# Patient Record
Sex: Male | Born: 1935 | Race: White | Hispanic: No | Smoking: Never smoker
Health system: Southern US, Community
[De-identification: ages and names within clinical notes are randomized; demographics above are authoritative.]

## PROBLEM LIST (undated history)

## (undated) DIAGNOSIS — H3530 Unspecified macular degeneration: Secondary | ICD-10-CM

## (undated) DIAGNOSIS — C44121 Squamous cell carcinoma of skin of unspecified eyelid, including canthus: Secondary | ICD-10-CM

## (undated) DIAGNOSIS — I499 Cardiac arrhythmia, unspecified: Secondary | ICD-10-CM

## (undated) DIAGNOSIS — D369 Benign neoplasm, unspecified site: Secondary | ICD-10-CM

## (undated) DIAGNOSIS — I509 Heart failure, unspecified: Secondary | ICD-10-CM

## (undated) DIAGNOSIS — K219 Gastro-esophageal reflux disease without esophagitis: Secondary | ICD-10-CM

## (undated) DIAGNOSIS — H409 Unspecified glaucoma: Secondary | ICD-10-CM

## (undated) DIAGNOSIS — F0390 Unspecified dementia without behavioral disturbance: Secondary | ICD-10-CM

## (undated) DIAGNOSIS — R4189 Other symptoms and signs involving cognitive functions and awareness: Secondary | ICD-10-CM

## (undated) DIAGNOSIS — I4891 Unspecified atrial fibrillation: Secondary | ICD-10-CM

## (undated) DIAGNOSIS — J189 Pneumonia, unspecified organism: Secondary | ICD-10-CM

## (undated) HISTORY — PX: MOHS SURGERY: SUR867

## (undated) HISTORY — PX: EYE SURGERY: SHX253

---

## 2011-03-12 DIAGNOSIS — D239 Other benign neoplasm of skin, unspecified: Secondary | ICD-10-CM

## 2011-03-12 HISTORY — DX: Other benign neoplasm of skin, unspecified: D23.9

## 2011-05-09 ENCOUNTER — Ambulatory Visit (INDEPENDENT_AMBULATORY_CARE_PROVIDER_SITE_OTHER): Payer: Medicare Other | Admitting: Internal Medicine

## 2011-05-09 ENCOUNTER — Encounter: Payer: Self-pay | Admitting: Internal Medicine

## 2011-05-09 DIAGNOSIS — I482 Chronic atrial fibrillation, unspecified: Secondary | ICD-10-CM | POA: Insufficient documentation

## 2011-05-09 DIAGNOSIS — Z0000 Encounter for general adult medical examination without abnormal findings: Secondary | ICD-10-CM

## 2011-05-09 DIAGNOSIS — R4189 Other symptoms and signs involving cognitive functions and awareness: Secondary | ICD-10-CM | POA: Insufficient documentation

## 2011-05-09 DIAGNOSIS — N400 Enlarged prostate without lower urinary tract symptoms: Secondary | ICD-10-CM

## 2011-05-09 DIAGNOSIS — I4891 Unspecified atrial fibrillation: Secondary | ICD-10-CM

## 2011-05-09 DIAGNOSIS — H409 Unspecified glaucoma: Secondary | ICD-10-CM

## 2011-05-09 DIAGNOSIS — F09 Unspecified mental disorder due to known physiological condition: Secondary | ICD-10-CM

## 2011-05-09 LAB — CBC WITH DIFFERENTIAL/PLATELET
Basophils Relative: 0.6 % (ref 0.0–3.0)
Eosinophils Absolute: 0.3 10*3/uL (ref 0.0–0.7)
Lymphocytes Relative: 14.9 % (ref 12.0–46.0)
MCHC: 34.8 g/dL (ref 30.0–36.0)
Neutrophils Relative %: 68.3 % (ref 43.0–77.0)
Platelets: 211 10*3/uL (ref 150.0–400.0)
RBC: 4.26 Mil/uL (ref 4.22–5.81)
WBC: 7.4 10*3/uL (ref 4.5–10.5)

## 2011-05-09 LAB — HEPATIC FUNCTION PANEL
ALT: 25 U/L (ref 0–53)
AST: 30 U/L (ref 0–37)
Bilirubin, Direct: 0.2 mg/dL (ref 0.0–0.3)
Total Bilirubin: 1.5 mg/dL — ABNORMAL HIGH (ref 0.3–1.2)

## 2011-05-09 LAB — TSH: TSH: 2.46 u[IU]/mL (ref 0.35–5.50)

## 2011-05-09 LAB — POCT INR: INR: 2.6

## 2011-05-09 LAB — BASIC METABOLIC PANEL
BUN: 16 mg/dL (ref 6–23)
Chloride: 105 mEq/L (ref 96–112)
Potassium: 4.6 mEq/L (ref 3.5–5.1)

## 2011-05-09 MED ORDER — MEMANTINE HCL 5 MG PO TABS: 5.0000 mg | ORAL_TABLET | Freq: Every day | ORAL | Status: DC

## 2011-05-09 MED ORDER — DIGOXIN 250 MCG PO TABS: ORAL_TABLET | ORAL | Status: DC

## 2011-05-09 NOTE — Progress Notes (Signed)
Subjective:    Patient ID: Howard Payne, male    DOB: 17-Sep-1920, 77 y.o.   MRN: [id number]  HPI  77 year old patient who is seen today to establish with our practice. He was hospitalized in 2003 for what sounds like new onset atrial fibrillation. He has been on chronic Coumadin anticoagulation since that time he has also been on rate control medications he has a history of cognitive impairment and has been on daily Namenda. He has seen ophthalmology recently.  1. Risk factors, based on past  M,S,F history  Risk  factors include age. He has a history of chronic atrial fibrillation  2.  Physical activities: Sedentary due age  and gait instability  3.  Depression/mood: No history of depression or mood disorder  4.  Hearing: No significant impairment 5.  ADL's: Requires assistance in all aspects of daily living  6.  Fall risk: High  7.  Home safety: Presently living at an assisted living facility 8.  Height weight, and visual acuity; stable recent eye exam  9.  Counseling: Restricted salt diet encouraged  10. Lab orders based on risk factors: Laboratory profile including prothrombin time will be reviewed  11. Referral : Followup ophthalmology  12. Care plan: Salt restricted diet. Will require assistance in all aspects of daily living  13. Cognitive assessment: Moderately impaired. We'll continue Namenda       Review of Systems  Constitutional: Negative for fever, chills, activity change, appetite change and fatigue.  HENT: Negative for hearing loss, ear pain, congestion, rhinorrhea, sneezing, mouth sores, trouble swallowing, neck pain, neck stiffness, dental problem, voice change, sinus pressure and tinnitus.   Eyes: Negative for photophobia, pain, redness and visual disturbance.  Respiratory: Negative for apnea, cough, choking, chest tightness, shortness of breath and wheezing.   Cardiovascular: Negative for chest pain, palpitations and leg swelling.  Gastrointestinal: Negative  for nausea, vomiting, abdominal pain, diarrhea, constipation, blood in stool, abdominal distention, anal bleeding and rectal pain.  Genitourinary: Positive for urgency and frequency. Negative for dysuria, hematuria, flank pain, decreased urine volume, discharge, penile swelling, scrotal swelling, difficulty urinating, genital sores and testicular pain.  Musculoskeletal: Negative for myalgias, back pain, joint swelling, arthralgias and gait problem.  Skin: Negative for color change, rash and wound.  Neurological: Negative for dizziness, tremors, seizures, syncope, facial asymmetry, speech difficulty, weakness, light-headedness, numbness and headaches.  Hematological: Negative for adenopathy. Does not bruise/bleed easily.  Psychiatric/Behavioral: Negative for suicidal ideas, hallucinations, behavioral problems, confusion, sleep disturbance, self-injury, dysphoric mood, decreased concentration and agitation. The patient is not nervous/anxious.        Objective:   Physical Exam  Constitutional: He appears well-developed and well-nourished.       Blood pressure 84/60  HENT:  Head: Normocephalic and atraumatic.  Right Ear: External ear normal.  Left Ear: External ear normal.  Nose: Nose normal.  Mouth/Throat: Oropharynx is clear and moist.  Eyes: Conjunctivae and EOM are normal. Pupils are equal, round, and reactive to light. No scleral icterus.  Neck: Normal range of motion. Neck supple. No JVD present. No thyromegaly present.  Cardiovascular: Normal rate, regular rhythm, normal heart sounds and intact distal pulses.  Exam reveals no gallop and no friction rub.   No murmur heard.      Rhythm is regular  Pedal pulses full except for an absent left dorsalis pedis pulse  Pulmonary/Chest: Effort normal. He has rales. He exhibits no tenderness.       Rare basilar rales noted  Abdominal: Soft. Bowel sounds  are normal. He exhibits no distension and no mass. There is no tenderness.  Genitourinary:  Penis normal. Guaiac negative stool.       Prostate is +2 enlarged  Bilateral hernias right greater than the left  Musculoskeletal: Normal range of motion. He exhibits edema. He exhibits no tenderness.       +2 pedal edema with stasis dermatitis  Lymphadenopathy:    He has no cervical adenopathy.  Neurological: He is alert. He has normal reflexes. No cranial nerve deficit. Coordination abnormal.       Unsteady gait  Skin: Skin is warm and dry. No rash noted.       Scattered facial excoriations  Psychiatric: He has a normal mood and affect. His behavior is normal.          Assessment & Plan:

## 2011-05-09 NOTE — Progress Notes (Signed)
Addended by: Bonnye Fava on: 05/09/2011 10:36 AM   Modules accepted: Orders

## 2011-05-09 NOTE — Patient Instructions (Addendum)
Decrease digoxin to one half tablet daily Decrease metoprolol to one half tablet every other day for 2 weeks and then discontinue  Limit your sodium (Salt) intake  Return office visit 4 weeks COUMADIN: 2.5 mg on wednesdays,fridays and sundays 3.75 mg on other days,check in 4 weeks

## 2011-05-29 ENCOUNTER — Other Ambulatory Visit: Payer: Self-pay | Admitting: Internal Medicine

## 2011-05-29 MED ORDER — WARFARIN SODIUM 2.5 MG PO TABS: ORAL_TABLET | ORAL | Status: DC

## 2011-05-29 NOTE — Telephone Encounter (Signed)
Pt req refill of warfarin (COUMADIN) 2.5 MG tablet. Pt is sch for ov on 06/07/11. Pls call in to [facility] [address] [phone]

## 2011-06-07 ENCOUNTER — Ambulatory Visit (INDEPENDENT_AMBULATORY_CARE_PROVIDER_SITE_OTHER): Payer: Medicare Other | Admitting: Internal Medicine

## 2011-06-07 ENCOUNTER — Encounter: Payer: Self-pay | Admitting: Internal Medicine

## 2011-06-07 DIAGNOSIS — F09 Unspecified mental disorder due to known physiological condition: Secondary | ICD-10-CM

## 2011-06-07 DIAGNOSIS — I482 Chronic atrial fibrillation, unspecified: Secondary | ICD-10-CM

## 2011-06-07 DIAGNOSIS — I4891 Unspecified atrial fibrillation: Secondary | ICD-10-CM

## 2011-06-07 DIAGNOSIS — N400 Enlarged prostate without lower urinary tract symptoms: Secondary | ICD-10-CM

## 2011-06-07 DIAGNOSIS — R4189 Other symptoms and signs involving cognitive functions and awareness: Secondary | ICD-10-CM

## 2011-06-07 LAB — POCT INR: INR: 2.2

## 2011-06-07 MED ORDER — TAMSULOSIN HCL 0.4 MG PO CAPS: 0.4000 mg | ORAL_CAPSULE | ORAL | Status: DC

## 2011-06-07 NOTE — Patient Instructions (Addendum)
Limit your sodium (Salt) intake  Return in 4 months for follow-up  Monthly prothrombin times  Same dose of coumadin

## 2011-06-07 NOTE — Progress Notes (Signed)
  Subjective:    Patient ID: Howard Payne, male    DOB: [birth year], 77 y.o.   MRN: [id number]  HPI  77 year old patient who has a history of mild cognitive impairment treated with Namenda. He was seen here for his initial exam recently and noted to be hypotensive. Metoprolol was tapered and discontinued and he remains on digoxin for rate control. He remains on chronic Coumadin anticoagulation. He is a well except for urinary frequency.    Review of Systems  Genitourinary: Positive for frequency.       Objective:   Physical Exam  Constitutional: He is oriented to person, place, and time. He appears well-developed.       Blood pressure 104/70  HENT:  Head: Normocephalic.  Right Ear: External ear normal.  Left Ear: External ear normal.  Eyes: Conjunctivae and EOM are normal.  Neck: Normal range of motion.  Cardiovascular: Normal rate and normal heart sounds.        Controlled ventricular response  Pulmonary/Chest: Breath sounds normal.  Abdominal: Bowel sounds are normal.  Musculoskeletal: Normal range of motion. He exhibits no edema and no tenderness.  Neurological: He is alert and oriented to person, place, and time.  Psychiatric: He has a normal mood and affect. His behavior is normal.          Assessment & Plan:   Atrial fibrillation Hypotension resolved  BPH. He has occasional frequent nocturia Will place on Flomax and observe  We'll check a pro time

## 2011-06-07 NOTE — Progress Notes (Signed)
Addended by: Rita Ohara R on: 06/07/2011 01:51 PM   Modules accepted: Orders

## 2011-07-08 ENCOUNTER — Ambulatory Visit: Payer: Medicare Other

## 2011-07-08 NOTE — Patient Instructions (Addendum)
2.5 mg on wednesdays,fridays and sundays 3.75 mg on other days,check in 4 weeks

## 2011-07-09 ENCOUNTER — Other Ambulatory Visit: Payer: Self-pay | Admitting: Internal Medicine

## 2011-08-08 ENCOUNTER — Other Ambulatory Visit: Payer: Medicare Other

## 2011-08-08 ENCOUNTER — Ambulatory Visit (INDEPENDENT_AMBULATORY_CARE_PROVIDER_SITE_OTHER): Payer: Medicare Other | Admitting: Internal Medicine

## 2011-08-08 ENCOUNTER — Encounter: Payer: Self-pay | Admitting: Internal Medicine

## 2011-08-08 DIAGNOSIS — Z23 Encounter for immunization: Secondary | ICD-10-CM

## 2011-08-08 DIAGNOSIS — I482 Chronic atrial fibrillation, unspecified: Secondary | ICD-10-CM

## 2011-08-08 DIAGNOSIS — L309 Dermatitis, unspecified: Secondary | ICD-10-CM

## 2011-08-08 DIAGNOSIS — L259 Unspecified contact dermatitis, unspecified cause: Secondary | ICD-10-CM

## 2011-08-08 DIAGNOSIS — I4891 Unspecified atrial fibrillation: Secondary | ICD-10-CM

## 2011-08-08 DIAGNOSIS — Z0000 Encounter for general adult medical examination without abnormal findings: Secondary | ICD-10-CM

## 2011-08-08 MED ORDER — METHYLPREDNISOLONE ACETATE 40 MG/ML IJ SUSP
40.0000 mg | Freq: Once | INTRAMUSCULAR | Status: AC
  Administered 2011-08-08: 40 mg via INTRAMUSCULAR

## 2011-08-08 MED ORDER — TRIAMCINOLONE ACETONIDE 0.025 % EX CREA: TOPICAL_CREAM | Freq: Two times a day (BID) | CUTANEOUS | Status: DC

## 2011-08-08 NOTE — Progress Notes (Signed)
  Subjective:    Patient ID: Howard Payne, male    DOB: [birth year], 77 y.o.   MRN: [id number]  HPI  77 year old patient who has a history of chronic atrial fibrillation. He has cognitive impairment. For the past month he has been complaining of some itching involving his posterior back area. His daughter recently noticed this and he is brought in today for followup. The patient has been using alcohol with a brush to try to obtain relief from the itching.    Review of Systems  Skin: Positive for rash.       Objective:   Physical Exam  Constitutional: He appears well-developed and well-nourished. No distress.       Blood pressure well controlled  Cardiovascular:       Controlled ventricular response  Skin:       Patient had a maculopapular rash involving the back area multiple seborrheic dermatoses noted          Assessment & Plan:   Maculopapular rash of the back. Nonspecific we'll treat with 40 mg of Depo-Medrol as well as with topical triamcinolone. Local skin care discussed. He will avoid trauma and alcohol use

## 2011-08-08 NOTE — Patient Instructions (Addendum)
COUMADIN: Same dose, 2.5 mg on wednesdays,fridays and sundays 3.75 mg on other days,check in 4  weeks  Avoid topical alcohol

## 2011-08-08 NOTE — Progress Notes (Signed)
Addended by: Duard Brady I on: 08/08/2011 11:34 AM   Modules accepted: Orders

## 2011-10-08 ENCOUNTER — Ambulatory Visit (INDEPENDENT_AMBULATORY_CARE_PROVIDER_SITE_OTHER): Payer: Medicare Other | Admitting: Internal Medicine

## 2011-10-08 ENCOUNTER — Encounter: Payer: Self-pay | Admitting: Internal Medicine

## 2011-10-08 DIAGNOSIS — F09 Unspecified mental disorder due to known physiological condition: Secondary | ICD-10-CM

## 2011-10-08 DIAGNOSIS — I482 Chronic atrial fibrillation, unspecified: Secondary | ICD-10-CM

## 2011-10-08 DIAGNOSIS — I4891 Unspecified atrial fibrillation: Secondary | ICD-10-CM

## 2011-10-08 DIAGNOSIS — R4189 Other symptoms and signs involving cognitive functions and awareness: Secondary | ICD-10-CM

## 2011-10-08 DIAGNOSIS — N400 Enlarged prostate without lower urinary tract symptoms: Secondary | ICD-10-CM

## 2011-10-08 NOTE — Patient Instructions (Addendum)
Monthly INR  Limit your sodium (Salt) intake  Return in 6 months for follow-up    Latest dosing instructions   Total Sun Mon Tue Wed Thu Fri Sat   22.5 2.5 mg 3.75 mg 3.75 mg 2.5 mg 3.75 mg 2.5 mg 3.75 mg    (2.5 mg1) (2.5 mg1.5) (2.5 mg1.5) (2.5 mg1) (2.5 mg1.5) (2.5 mg1) (2.5 mg1.5)

## 2011-10-08 NOTE — Progress Notes (Signed)
  Subjective:    Patient ID: Howard Payne, male    DOB: 26-Dec-1919, 77 y.o.   MRN: [id number]  HPI  77 year old patient who is seen today for followup. He has a history of chronic atrial fibrillation and is on rate control and Coumadin anticoagulation. He has moderate cognitive impairment and remains on low dose Namenda. He is doing quite well today. He has glaucoma and also macular degeneration. No cardiopulmonary complaints. Apparently no INR for 2 months    Review of Systems  Constitutional: Negative for fever, chills, appetite change and fatigue.  HENT: Negative for hearing loss, ear pain, congestion, sore throat, trouble swallowing, neck stiffness, dental problem, voice change and tinnitus.   Eyes: Positive for visual disturbance. Negative for pain and discharge.  Respiratory: Negative for cough, chest tightness, wheezing and stridor.   Cardiovascular: Negative for chest pain, palpitations and leg swelling.  Gastrointestinal: Negative for nausea, vomiting, abdominal pain, diarrhea, constipation, blood in stool and abdominal distention.  Genitourinary: Negative for urgency, hematuria, flank pain, discharge, difficulty urinating and genital sores.  Musculoskeletal: Negative for myalgias, back pain, joint swelling, arthralgias and gait problem.  Skin: Negative for rash.  Neurological: Negative for dizziness, syncope, speech difficulty, weakness, numbness and headaches.  Hematological: Negative for adenopathy. Does not bruise/bleed easily.  Psychiatric/Behavioral: Positive for confusion. Negative for behavioral problems and dysphoric mood. The patient is not nervous/anxious.        Objective:   Physical Exam  Constitutional: He is oriented to person, place, and time. He appears well-developed.  HENT:  Head: Normocephalic.  Right Ear: External ear normal.  Left Ear: External ear normal.  Eyes: Conjunctivae and EOM are normal.  Neck: Normal range of motion.  Cardiovascular: Normal rate  and normal heart sounds.        Controlled ventricular response  Pulmonary/Chest: Breath sounds normal.  Abdominal: Bowel sounds are normal.  Musculoskeletal: Normal range of motion. He exhibits no edema and no tenderness.       Trace edema  Neurological: He is alert and oriented to person, place, and time.  Psychiatric: He has a normal mood and affect. His behavior is normal.          Assessment & Plan:   Chronic atrial fibrillation  Coumadin anticoagulation. We'll check an INR Dementia. We'll continue Namenda  Monthly INRs will be monitored We'll see back in 6 months

## 2011-11-08 ENCOUNTER — Ambulatory Visit: Payer: Medicare Other

## 2011-11-08 DIAGNOSIS — I4891 Unspecified atrial fibrillation: Secondary | ICD-10-CM

## 2011-11-08 DIAGNOSIS — I482 Chronic atrial fibrillation, unspecified: Secondary | ICD-10-CM

## 2011-11-08 LAB — POCT INR: INR: 1.8

## 2011-11-08 NOTE — Patient Instructions (Signed)
  Latest dosing instructions   Total Sun Mon Tue Wed Thu Fri Sat   23.75 3.75 mg 2.5 mg 3.75 mg 3.75 mg 2.5 mg 3.75 mg 3.75 mg    (2.5 mg1.5) (2.5 mg1) (2.5 mg1.5) (2.5 mg1.5) (2.5 mg1) (2.5 mg1.5) (2.5 mg1.5)

## 2011-12-09 ENCOUNTER — Ambulatory Visit (INDEPENDENT_AMBULATORY_CARE_PROVIDER_SITE_OTHER): Payer: Medicare Other | Admitting: Internal Medicine

## 2011-12-09 DIAGNOSIS — I4891 Unspecified atrial fibrillation: Secondary | ICD-10-CM

## 2011-12-09 DIAGNOSIS — I482 Chronic atrial fibrillation, unspecified: Secondary | ICD-10-CM

## 2011-12-09 NOTE — Patient Instructions (Signed)
  Latest dosing instructions   Total Sun Mon Tue Wed Thu Fri Sat   26.25 3.75 mg 3.75 mg 3.75 mg 3.75 mg 3.75 mg 3.75 mg 3.75 mg    (2.5 mg1.5) (2.5 mg1.5) (2.5 mg1.5) (2.5 mg1.5) (2.5 mg1.5) (2.5 mg1.5) (2.5 mg1.5)        

## 2011-12-30 ENCOUNTER — Ambulatory Visit (INDEPENDENT_AMBULATORY_CARE_PROVIDER_SITE_OTHER): Payer: Medicare Other | Admitting: Internal Medicine

## 2011-12-30 DIAGNOSIS — I482 Chronic atrial fibrillation, unspecified: Secondary | ICD-10-CM

## 2011-12-30 DIAGNOSIS — I4891 Unspecified atrial fibrillation: Secondary | ICD-10-CM

## 2011-12-30 NOTE — Patient Instructions (Signed)
  Latest dosing instructions   Total Sun Mon Tue Wed Thu Fri Sat   26.25 3.75 mg 3.75 mg 3.75 mg 3.75 mg 3.75 mg 3.75 mg 3.75 mg    (2.5 mg1.5) (2.5 mg1.5) (2.5 mg1.5) (2.5 mg1.5) (2.5 mg1.5) (2.5 mg1.5) (2.5 mg1.5)

## 2012-01-27 ENCOUNTER — Ambulatory Visit (INDEPENDENT_AMBULATORY_CARE_PROVIDER_SITE_OTHER): Payer: Medicare Other

## 2012-01-27 DIAGNOSIS — Z7901 Long term (current) use of anticoagulants: Secondary | ICD-10-CM

## 2012-01-27 DIAGNOSIS — I482 Chronic atrial fibrillation, unspecified: Secondary | ICD-10-CM

## 2012-01-27 LAB — POCT INR: INR: 2

## 2012-01-27 NOTE — Patient Instructions (Signed)
  Latest dosing instructions   Total Sun Mon Tue Wed Thu Fri Sat   26.25 3.75 mg 3.75 mg 3.75 mg 3.75 mg 3.75 mg 3.75 mg 3.75 mg    (2.5 mg1.5) (2.5 mg1.5) (2.5 mg1.5) (2.5 mg1.5) (2.5 mg1.5) (2.5 mg1.5) (2.5 mg1.5)        

## 2012-02-03 ENCOUNTER — Ambulatory Visit (INDEPENDENT_AMBULATORY_CARE_PROVIDER_SITE_OTHER): Payer: Medicare Other | Admitting: Internal Medicine

## 2012-02-03 ENCOUNTER — Encounter: Payer: Self-pay | Admitting: Internal Medicine

## 2012-02-03 VITALS — BP 100/60 | HR 60 | Temp 98.6°F | Wt 196.0 lb

## 2012-02-03 DIAGNOSIS — R0981 Nasal congestion: Secondary | ICD-10-CM

## 2012-02-03 DIAGNOSIS — R4189 Other symptoms and signs involving cognitive functions and awareness: Secondary | ICD-10-CM

## 2012-02-03 DIAGNOSIS — I4891 Unspecified atrial fibrillation: Secondary | ICD-10-CM

## 2012-02-03 DIAGNOSIS — F09 Unspecified mental disorder due to known physiological condition: Secondary | ICD-10-CM

## 2012-02-03 DIAGNOSIS — I482 Chronic atrial fibrillation, unspecified: Secondary | ICD-10-CM

## 2012-02-03 DIAGNOSIS — J3489 Other specified disorders of nose and nasal sinuses: Secondary | ICD-10-CM

## 2012-02-03 MED ORDER — FLUTICASONE PROPIONATE 50 MCG/ACT NA SUSP: 2.0000 | Freq: Every day | NASAL | Status: DC

## 2012-02-03 NOTE — Progress Notes (Signed)
  Subjective:    Patient ID: Howard Payne, male    DOB: 1920-05-24, 78 y.o.   MRN: [id number]  HPI  78 year old patient who has a history of chronic atrial fibrillation who has been on chronic Coumadin anticoagulation. Last night he complained of shortness of breath at his extended care facility and EMS was notified. His initial blood pressure was 180/100 but later decreased to 146/92. Clinically checked out well with normal oxygen saturations and referral to ED was considered but due to his stable clinical status canceled. Today he complains of nasal stuffiness. He does have a history of chronic nasal polyposis and apparently has had polypectomies on 3 prior occasions but none recently. His only complaint today is nasal stuffiness and cough. There's been no fever or sputum production. Denies any shortness of breath at this time. No history of congestive heart failure. He does have a history of glaucoma as well as BPH  Wt Readings from Last 3 Encounters:  02/03/12 196 lb (88.905 kg)  10/08/11 199 lb (90.266 kg)  08/08/11 195 lb (88.451 kg)    Review of Systems  Constitutional: Negative for fever, chills, appetite change and fatigue.  HENT: Positive for congestion, rhinorrhea and sinus pressure. Negative for hearing loss, ear pain, sore throat, trouble swallowing, neck stiffness, dental problem, voice change and tinnitus.   Eyes: Negative for pain, discharge and visual disturbance.  Respiratory: Positive for cough and shortness of breath. Negative for chest tightness, wheezing and stridor.   Cardiovascular: Negative for chest pain, palpitations and leg swelling.  Gastrointestinal: Negative for nausea, vomiting, abdominal pain, diarrhea, constipation, blood in stool and abdominal distention.  Genitourinary: Negative for urgency, hematuria, flank pain, discharge, difficulty urinating and genital sores.  Musculoskeletal: Negative for myalgias, back pain, joint swelling, arthralgias and gait problem.    Skin: Negative for rash.  Neurological: Negative for dizziness, syncope, speech difficulty, weakness, numbness and headaches.  Hematological: Negative for adenopathy. Does not bruise/bleed easily.  Psychiatric/Behavioral: Negative for behavioral problems and dysphoric mood. The patient is not nervous/anxious.        Objective:   Physical Exam  Constitutional: He is oriented to person, place, and time. He appears well-developed.  HENT:  Head: Normocephalic.  Right Ear: External ear normal.  Left Ear: External ear normal.       Nasal mucosa congested and erythematous bilaterally  Eyes: Conjunctivae and EOM are normal.  Neck: Normal range of motion.  Cardiovascular: Normal rate and normal heart sounds.        Irregular rhythm with a controlled ventricular response  Pulmonary/Chest: Breath sounds normal.  Abdominal: Bowel sounds are normal.  Genitourinary:       Trace lower extremity edema  Musculoskeletal: Normal range of motion. He exhibits edema. He exhibits no tenderness.  Neurological: He is alert and oriented to person, place, and time.  Psychiatric: He has a normal mood and affect. His behavior is normal.          Assessment & Plan:

## 2012-02-03 NOTE — Patient Instructions (Signed)
Limit your sodium (Salt) intake  Return in 3 months for follow-up   

## 2012-02-05 ENCOUNTER — Telehealth: Payer: Self-pay | Admitting: *Deleted

## 2012-02-05 NOTE — Telephone Encounter (Signed)
Daughter called stating pt was coughing a little less and sleeping better.  Advised to push liquids, watch for any elevated temp, SOB, and call with any questions.  Also, watch to make sure cough settles down eventually instead of getting worse.

## 2012-02-06 ENCOUNTER — Encounter: Payer: Self-pay | Admitting: Internal Medicine

## 2012-02-06 ENCOUNTER — Ambulatory Visit (INDEPENDENT_AMBULATORY_CARE_PROVIDER_SITE_OTHER): Payer: Medicare Other | Admitting: Internal Medicine

## 2012-02-06 DIAGNOSIS — I482 Chronic atrial fibrillation, unspecified: Secondary | ICD-10-CM

## 2012-02-06 DIAGNOSIS — R58 Hemorrhage, not elsewhere classified: Secondary | ICD-10-CM

## 2012-02-06 DIAGNOSIS — I998 Other disorder of circulatory system: Secondary | ICD-10-CM

## 2012-02-06 DIAGNOSIS — R4189 Other symptoms and signs involving cognitive functions and awareness: Secondary | ICD-10-CM

## 2012-02-06 DIAGNOSIS — I4891 Unspecified atrial fibrillation: Secondary | ICD-10-CM

## 2012-02-06 DIAGNOSIS — F09 Unspecified mental disorder due to known physiological condition: Secondary | ICD-10-CM

## 2012-02-06 DIAGNOSIS — Z7901 Long term (current) use of anticoagulants: Secondary | ICD-10-CM

## 2012-02-06 NOTE — Telephone Encounter (Signed)
Dr. K notified. 

## 2012-02-06 NOTE — Progress Notes (Signed)
  Subjective:    Patient ID: Howard Payne, male    DOB: 13-Apr-1920, 78 y.o.   MRN: [id number]  HPI  Wt Readings from Last 3 Encounters:  02/03/12 196 lb (88.905 kg)  10/08/11 199 lb (90.266 kg)  08/08/11 195 lb (88.451 kg)    Review of Systems     Objective:   Physical Exam        Assessment & Plan:

## 2012-02-06 NOTE — Progress Notes (Signed)
  Subjective:    Patient ID: Howard Payne, male    DOB: 31-Aug-1920, 78 y.o.   MRN: [id number]  HPI  an 78 year old patient who has a history of chronic atrial fibrillation who has been on chronic Coumadin anticoagulation. He was seen here earlier in the week had an episode of the shortness of breath through the night he self and developed 2 days of very vigorous coughing. He was noted at his extended care facility to have extensive bruising involving the right flank area. An INR was 2.09 days ago. His cough has large resolved and in general he feels well. He does have significant cognitive impairment. There is no history of fall or trauma    Review of Systems  Respiratory: Positive for cough.   Skin: Positive for wound.       Objective:   Physical Exam  Constitutional:       No acute distress O2 saturation 97% pulse rate 72 Significant cognitive impairment    Cardiovascular: Normal rate.   Pulmonary/Chest: Effort normal and breath sounds normal.       A few crackles at both bases  Skin:       Extensive ecchymoses was present involving the right flank area this extended to the buttock area on the right right lateral upper leg and into the right groin region          Assessment & Plan:   Ecchymoses right flank. The patient does have mild tenderness over the right lateral lower chest wall. We'll check an INR to rule out Coumadin coagulopathy Chronic atrial fibrillation Cognitive impairment

## 2012-02-06 NOTE — Patient Instructions (Addendum)
Limit your sodium (Salt) intake  Call or return to clinic prn if these symptoms worsen or fail to improve as anticipated.     Latest dosing instructions   Total Sun Mon Tue Wed Thu Fri Sat   26.25 3.75 mg 3.75 mg 3.75 mg 3.75 mg 3.75 mg 3.75 mg 3.75 mg    (2.5 mg1.5) (2.5 mg1.5) (2.5 mg1.5) (2.5 mg1.5) (2.5 mg1.5) (2.5 mg1.5) (2.5 mg1.5)

## 2012-02-24 ENCOUNTER — Ambulatory Visit: Payer: Medicare Other

## 2012-03-02 ENCOUNTER — Ambulatory Visit: Payer: Medicare Other

## 2012-03-05 ENCOUNTER — Ambulatory Visit (INDEPENDENT_AMBULATORY_CARE_PROVIDER_SITE_OTHER): Payer: Medicare Other | Admitting: Internal Medicine

## 2012-03-05 DIAGNOSIS — I482 Chronic atrial fibrillation, unspecified: Secondary | ICD-10-CM

## 2012-03-05 DIAGNOSIS — I4891 Unspecified atrial fibrillation: Secondary | ICD-10-CM

## 2012-03-05 NOTE — Patient Instructions (Signed)
  Latest dosing instructions   Total Sun Mon Tue Wed Thu Fri Sat   26.25 3.75 mg 3.75 mg 3.75 mg 3.75 mg 3.75 mg 3.75 mg 3.75 mg    (2.5 mg1.5) (2.5 mg1.5) (2.5 mg1.5) (2.5 mg1.5) (2.5 mg1.5) (2.5 mg1.5) (2.5 mg1.5)

## 2012-03-19 ENCOUNTER — Ambulatory Visit: Payer: Medicare Other

## 2012-03-20 ENCOUNTER — Ambulatory Visit (INDEPENDENT_AMBULATORY_CARE_PROVIDER_SITE_OTHER): Payer: Medicare Other

## 2012-03-20 DIAGNOSIS — I482 Chronic atrial fibrillation, unspecified: Secondary | ICD-10-CM

## 2012-03-20 DIAGNOSIS — I4891 Unspecified atrial fibrillation: Secondary | ICD-10-CM

## 2012-03-20 NOTE — Patient Instructions (Signed)
  Latest dosing instructions   Total Sun Mon Tue Wed Thu Fri Sat   26.25 3.75 mg 3.75 mg 3.75 mg 3.75 mg 3.75 mg 3.75 mg 3.75 mg    (2.5 mg1.5) (2.5 mg1.5) (2.5 mg1.5) (2.5 mg1.5) (2.5 mg1.5) (2.5 mg1.5) (2.5 mg1.5)        

## 2012-04-06 ENCOUNTER — Telehealth: Payer: Self-pay

## 2012-04-06 ENCOUNTER — Emergency Department (HOSPITAL_COMMUNITY)
Admission: EM | Admit: 2012-04-06 | Discharge: 2012-04-06 | Disposition: A | Discharge status: Home / Self Care | Payer: Medicare Other | Attending: Emergency Medicine | Admitting: Emergency Medicine

## 2012-04-06 ENCOUNTER — Encounter (HOSPITAL_COMMUNITY): Payer: Self-pay | Admitting: Family Medicine

## 2012-04-06 DIAGNOSIS — Z79899 Other long term (current) drug therapy: Secondary | ICD-10-CM | POA: Insufficient documentation

## 2012-04-06 DIAGNOSIS — R040 Epistaxis: Secondary | ICD-10-CM

## 2012-04-06 HISTORY — DX: Cardiac arrhythmia, unspecified: I49.9

## 2012-04-06 HISTORY — DX: Other symptoms and signs involving cognitive functions and awareness: R41.89

## 2012-04-06 HISTORY — DX: Unspecified macular degeneration: H35.30

## 2012-04-06 LAB — POCT I-STAT, CHEM 8
Calcium, Ion: 1.19 mmol/L (ref 1.12–1.32)
Chloride: 107 mEq/L (ref 96–112)
HCT: 39 % (ref 39.0–52.0)
Potassium: 3.8 mEq/L (ref 3.5–5.1)
Sodium: 140 mEq/L (ref 135–145)

## 2012-04-06 LAB — PROTIME-INR
INR: 2.06 — ABNORMAL HIGH (ref 0.00–1.49)
Prothrombin Time: 23.6 seconds — ABNORMAL HIGH (ref 11.6–15.2)

## 2012-04-06 MED ORDER — SERTRALINE HCL 25 MG PO TABS: 25.0000 mg | ORAL_TABLET | Freq: Every day | ORAL | Status: DC

## 2012-04-06 MED ORDER — COCAINE HCL 4 % EX SOLN
4.0000 mL | Freq: Once | CUTANEOUS | Status: AC
  Administered 2012-04-06: 4 mL via NASAL
  Filled 2012-04-06: qty 4

## 2012-04-06 MED ORDER — SILVER NITRATE-POT NITRATE 75-25 % EX MISC
CUTANEOUS | Status: AC
  Administered 2012-04-06: 22:00:00
  Filled 2012-04-06: qty 1

## 2012-04-06 NOTE — ED Provider Notes (Signed)
History     CSN: [id number]  Arrival date & time 04/06/12  1827   First MD Initiated Contact with Patient 04/06/12 1927      Chief Complaint  Patient presents with  . Epistaxis    HPI Pt had a nosebleed earlier this evening.  EMS was called and he was noted to be hypertensive.  THe nosebleed has subsequently resolved.  Pt did have his coumadin adjusted recently.  No injuries.  No fever.  No other complaints.  The bleeding was coming from the right nostril.   Past Medical History  Diagnosis Date  . Irregular heart rate   . Macular degeneration   . Cognitive impairment   . Hypotension     History reviewed. No pertinent past surgical history.  History reviewed. No pertinent family history.  History  Substance Use Topics  . Smoking status: Never Smoker   . Smokeless tobacco: Never Used  . Alcohol Use: No      Review of Systems  All other systems reviewed and are negative.    Allergies  Review of patient's allergies indicates no known allergies.  Home Medications   Current Outpatient Rx  Name Route Sig Dispense Refill  . DIGOXIN 0.25 MG PO TABS  One half tablet daily 90 tablet 4  . MEMANTINE HCL 5 MG PO TABS Oral Take 1 tablet (5 mg total) by mouth daily. 90 tablet 4  . TRAVOPROST 0.004 % OP SOLN Both Eyes Place 1 drop into both eyes.      . WARFARIN SODIUM 2.5 MG PO TABS  TAKE 1 TABLET BY MOUTH SUN,WED,FRI AND TAKE 1 AND 1/2 TABLET ON MON, TUES, THURS, SAT 90 tablet 5  . FLUTICASONE PROPIONATE 50 MCG/ACT NA SUSP Nasal Place 2 sprays into the nose daily. 16 g 6  . SERTRALINE HCL 25 MG PO TABS Oral Take 1 tablet (25 mg total) by mouth daily. 60 tablet 0  . TRIAMCINOLONE ACETONIDE 0.025 % EX CREA Topical Apply topically 2 (two) times daily. 60 g 2    BP 119/70  Pulse 80  Temp(Src) 99.4 F (37.4 C) (Oral)  Resp 17  SpO2 97%  Physical Exam  Nursing note and vitals reviewed. Constitutional: He appears well-developed and well-nourished. No distress.  HENT:    Head: Normocephalic and atraumatic.  Right Ear: External ear normal.  Left Ear: External ear normal.       Dried blood right nares, no active bleeding  Eyes: Conjunctivae are normal. Right eye exhibits no discharge. Left eye exhibits no discharge. No scleral icterus.  Neck: Neck supple. No tracheal deviation present.  Cardiovascular: Normal rate, regular rhythm and intact distal pulses.   Pulmonary/Chest: Effort normal and breath sounds normal. No stridor. No respiratory distress. He has no wheezes. He has no rales.  Abdominal: Soft. Bowel sounds are normal. He exhibits no distension. There is no tenderness. There is no rebound and no guarding.  Musculoskeletal: He exhibits no edema and no tenderness.  Neurological: He is alert. He has normal strength. No sensory deficit. Cranial nerve deficit:  no gross defecits noted. He exhibits normal muscle tone. He displays no seizure activity. Coordination normal.  Skin: Skin is warm and dry. No rash noted.  Psychiatric: He has a normal mood and affect.    ED Course  EPISTAXIS MANAGEMENT Performed by: Linwood Dibbles R Authorized by: Linwood Dibbles R Consent: Verbal consent obtained. Consent given by: patient Local anesthetic: lidocaine spray Patient sedated: no Treatment site: right anterior Repair method: silver  nitrate Treatment complexity: simple Recurrence: recurrence of recent bleed Patient tolerance: Patient tolerated the procedure well with no immediate complications.   (including critical care time)  Labs Reviewed  PROTIME-INR - Abnormal; Notable for the following:    Prothrombin Time 23.6 (*)    INR 2.06 (*)    All other components within normal limits  POCT I-STAT, CHEM 8 - Abnormal; Notable for the following:    Glucose, Bld 112 (*)    All other components within normal limits   No results found.   1. Epistaxis       MDM  Pt monitored in the ED for several hours.  No further nasal bleeding.  Stable for discharge.  No  anemia and inr therapeutic        Celene Kras, MD 04/06/12 [phone]

## 2012-04-06 NOTE — Telephone Encounter (Signed)
Triage VM:  Cherlyn Roberts called and would like to know if Dr. Levie Heritage would consider giving pt an antidepressant such as Zoloft.  Per Ms. Wyline Mood pt is still dressing himself and participating in activities but pt has been saying "things are not like they use to be".  If an ov is needed pt can come in.  Pls advise.

## 2012-04-06 NOTE — ED Notes (Signed)
Per EMS: Pt from [facility]. Reports nosebleed from right nostril starting at approximately 17:45 today. States pt lost approx 20 cc of blood. Bleeding controlled at this time. VSS. NAD noted at this time.

## 2012-04-06 NOTE — ED Notes (Signed)
Patient given discharge instructions, information, prescriptions, and diet order. Patient states that they adequately understand discharge information given and to return to ED if symptoms return or worsen.     

## 2012-04-06 NOTE — Discharge Instructions (Signed)

## 2012-04-06 NOTE — Telephone Encounter (Signed)
Called and spoke with Cherlyn Roberts and she is aware of Dr. Vernon Prey recommendation.  [facility] [address].

## 2012-04-06 NOTE — Telephone Encounter (Signed)
Sertraline 25 mg #60. Suggest office visit in 6 weeks

## 2012-04-23 ENCOUNTER — Encounter: Payer: Medicare Other | Admitting: Family

## 2012-04-24 ENCOUNTER — Encounter: Payer: Medicare Other | Admitting: Family

## 2012-04-27 ENCOUNTER — Ambulatory Visit (INDEPENDENT_AMBULATORY_CARE_PROVIDER_SITE_OTHER): Payer: Medicare Other | Admitting: Family

## 2012-04-27 DIAGNOSIS — I482 Chronic atrial fibrillation, unspecified: Secondary | ICD-10-CM

## 2012-04-27 DIAGNOSIS — I4891 Unspecified atrial fibrillation: Secondary | ICD-10-CM

## 2012-04-27 LAB — POCT INR: INR: 2

## 2012-04-27 NOTE — Progress Notes (Signed)
Addended by: CAMPBELL, Keldrick Pomplun B on: 04/27/2012 02:49 PM   Modules accepted: Orders  

## 2012-04-27 NOTE — Patient Instructions (Signed)
  Latest dosing instructions   Total Sun Mon Tue Wed Thu Fri Sat   23.75 3.75 mg 2.5 mg 3.75 mg 3.75 mg 2.5 mg 3.75 mg 3.75 mg    (2.5 mg1.5) (2.5 mg1) (2.5 mg1.5) (2.5 mg1.5) (2.5 mg1) (2.5 mg1.5) (2.5 mg1.5)        

## 2012-05-14 HISTORY — PX: OTHER SURGICAL HISTORY: SHX169

## 2012-05-18 ENCOUNTER — Ambulatory Visit (INDEPENDENT_AMBULATORY_CARE_PROVIDER_SITE_OTHER): Payer: Medicare Other | Admitting: Family

## 2012-05-18 ENCOUNTER — Encounter: Payer: Self-pay | Admitting: Internal Medicine

## 2012-05-18 ENCOUNTER — Ambulatory Visit (INDEPENDENT_AMBULATORY_CARE_PROVIDER_SITE_OTHER): Payer: Medicare Other | Admitting: Internal Medicine

## 2012-05-18 VITALS — BP 100/60 | Temp 98.3°F | Wt 188.0 lb

## 2012-05-18 DIAGNOSIS — F09 Unspecified mental disorder due to known physiological condition: Secondary | ICD-10-CM

## 2012-05-18 DIAGNOSIS — I482 Chronic atrial fibrillation, unspecified: Secondary | ICD-10-CM

## 2012-05-18 DIAGNOSIS — R4189 Other symptoms and signs involving cognitive functions and awareness: Secondary | ICD-10-CM

## 2012-05-18 DIAGNOSIS — I4891 Unspecified atrial fibrillation: Secondary | ICD-10-CM

## 2012-05-18 DIAGNOSIS — Z7901 Long term (current) use of anticoagulants: Secondary | ICD-10-CM

## 2012-05-18 LAB — POCT INR: INR: 2.5

## 2012-05-18 NOTE — Progress Notes (Signed)
  Subjective:    Patient ID: Howard Payne, male    DOB: 03-Dec-1919, 78 y.o.   MRN: [id number]  HPI  78 year old patient who has a history of chronic atrial for relation who is on chronic Coumadin anticoagulation. He has been on sertraline recently do to mild depression. He is accompanied by a daughter who feels that has been helpful. The patient has a number of ailments including partial blindness secondary to macular degeneration. He has dieted impairment that has been stable in general he feels that he has done well and has no major complaints today.   Review of Systems  Constitutional: Negative for fever, chills, appetite change and fatigue.  HENT: Negative for hearing loss, ear pain, congestion, sore throat, trouble swallowing, neck stiffness, dental problem, voice change and tinnitus.   Eyes: Negative for pain, discharge and visual disturbance.  Respiratory: Negative for cough, chest tightness, wheezing and stridor.   Cardiovascular: Negative for chest pain, palpitations and leg swelling.  Gastrointestinal: Negative for nausea, vomiting, abdominal pain, diarrhea, constipation, blood in stool and abdominal distention.  Genitourinary: Negative for urgency, hematuria, flank pain, discharge, difficulty urinating and genital sores.  Musculoskeletal: Negative for myalgias, back pain, joint swelling, arthralgias and gait problem.  Skin: Negative for rash.  Neurological: Negative for dizziness, syncope, speech difficulty, weakness, numbness and headaches.  Hematological: Negative for adenopathy. Does not bruise/bleed easily.  Psychiatric/Behavioral: Positive for behavioral problems, dysphoric mood and decreased concentration. The patient is not nervous/anxious.        Objective:   Physical Exam  Constitutional: He is oriented to person, place, and time. He appears well-developed.  HENT:  Head: Normocephalic.  Right Ear: External ear normal.  Left Ear: External ear normal.  Eyes:  Conjunctivae and EOM are normal.  Neck: Normal range of motion.  Cardiovascular: Normal rate and normal heart sounds.        Irregular rhythm with a controlled ventricular response  Pulmonary/Chest: Effort normal and breath sounds normal.       Few crackles right base  Abdominal: Bowel sounds are normal.  Musculoskeletal: Normal range of motion. He exhibits no edema and no tenderness.  Neurological: He is alert and oriented to person, place, and time.  Psychiatric: He has a normal mood and affect. His behavior is normal.          Assessment & Plan:   Cognitive impairment Mood disorder. We'll continue sertraline 25 Chronic atrial fibrillation. We'll check INR today

## 2012-05-18 NOTE — Patient Instructions (Signed)
Limit your sodium (Salt) intake   

## 2012-05-18 NOTE — Progress Notes (Signed)
  Subjective:    Patient ID: Howard Payne, male    DOB: 05/09/1920, 78 y.o.   MRN: [id number]  HPI  Wt Readings from Last 3 Encounters:  05/18/12 188 lb (85.276 kg)  02/03/12 196 lb (88.905 kg)  10/08/11 199 lb (90.266 kg)    Review of Systems     Objective:   Physical Exam        Assessment & Plan:

## 2012-05-18 NOTE — Patient Instructions (Signed)
  Latest dosing instructions   Total Sun Mon Tue Wed Thu Fri Sat   23.75 3.75 mg 2.5 mg 3.75 mg 3.75 mg 2.5 mg 3.75 mg 3.75 mg    (2.5 mg1.5) (2.5 mg1) (2.5 mg1.5) (2.5 mg1.5) (2.5 mg1) (2.5 mg1.5) (2.5 mg1.5)       Recheck in 4 week. Continue the same dosage.

## 2012-06-04 ENCOUNTER — Other Ambulatory Visit: Payer: Self-pay | Admitting: Internal Medicine

## 2012-06-04 DIAGNOSIS — Z0279 Encounter for issue of other medical certificate: Secondary | ICD-10-CM

## 2012-06-15 ENCOUNTER — Encounter: Payer: Medicare Other | Admitting: Family

## 2012-06-15 ENCOUNTER — Ambulatory Visit (INDEPENDENT_AMBULATORY_CARE_PROVIDER_SITE_OTHER): Payer: Medicare Other | Admitting: Family

## 2012-06-15 DIAGNOSIS — I4891 Unspecified atrial fibrillation: Secondary | ICD-10-CM

## 2012-06-15 DIAGNOSIS — I482 Chronic atrial fibrillation, unspecified: Secondary | ICD-10-CM

## 2012-06-15 NOTE — Patient Instructions (Addendum)
Today only, take an extra half tablet. 2.5mg  on mondays and thursdays 3.75mg  on other days, Check in 4 weeks    Latest dosing instructions   Total Sun Mon Tue Wed Thu Fri Sat   23.75 3.75 mg 2.5 mg 3.75 mg 3.75 mg 2.5 mg 3.75 mg 3.75 mg    (2.5 mg1.5) (2.5 mg1) (2.5 mg1.5) (2.5 mg1.5) (2.5 mg1) (2.5 mg1.5) (2.5 mg1.5)       Go off Coumadin 16th-21st. Patient has surgery on the 19th, then a 2nd procedure on the 21st. Resume on the evening of the 21st.

## 2012-06-17 ENCOUNTER — Other Ambulatory Visit: Payer: Self-pay | Admitting: Internal Medicine

## 2012-06-19 ENCOUNTER — Other Ambulatory Visit: Payer: Self-pay | Admitting: Internal Medicine

## 2012-07-01 ENCOUNTER — Telehealth: Payer: Self-pay | Admitting: Internal Medicine

## 2012-07-01 NOTE — Telephone Encounter (Signed)
Forward to Howard Payne to informed since dr. Amador Cunas out of office until next week

## 2012-07-01 NOTE — Telephone Encounter (Signed)
Caller: Ellen/Father; Phone: [phone]; Reason for Call: Caller: Ellen/Daughter; Patient Name: Howard Payne; PCP: Eleonore Chiquito; Best Callback Phone Number: [phone].  Pt.  Is scheduled to have MOHS procedure on eyelid for Squamous Cancer on 07/06/12.  Then on 07/08/12, pt.  Will have a skin graft with eyelid construction.  Daughter has medication question: Warfarin will be discontinued 5 days before reconstruction surgery on 07/08/12.  Pt.  Was not given instructions to stop Warfarin 3 days before MOHS procedure.  Daughter was wondering why one provider was telling them discontinue Warfarin 3 days before procedure, and another was telling to discontinue 5 days before the second procedure.  The stop date remains the same: 07/03/12.  Reassured Daughter that it depends on the level of surgery that determines times needed to be off Warfarin.  Daughter reassured that this note would be passed along to Dr.  Kirtland Bouchard and his nurses. CAN/db

## 2012-07-01 NOTE — Telephone Encounter (Signed)
The provider performing the procedure will ultimately decide when he wants the Coumadin stopped. The recommendation from the Coumadin clinic is 3 days. However, we are not performing the procedure. Therefore if the specialist believes the bleeding risk is higher, we support the skin specialist recommendation.

## 2012-07-06 DIAGNOSIS — C44121 Squamous cell carcinoma of skin of unspecified eyelid, including canthus: Secondary | ICD-10-CM

## 2012-07-06 HISTORY — DX: Squamous cell carcinoma of skin of unspecified eyelid, including canthus: C44.121

## 2012-07-12 ENCOUNTER — Other Ambulatory Visit: Payer: Self-pay | Admitting: Internal Medicine

## 2012-07-13 ENCOUNTER — Telehealth: Payer: Self-pay | Admitting: Internal Medicine

## 2012-07-13 LAB — PROTIME-INR: INR: 1.8 — AB (ref 0.9–1.1)

## 2012-07-13 NOTE — Telephone Encounter (Signed)
Caller: Ellen/Daughter; Patient Name: Howard Payne; PCP: Eleonore Chiquito [facility]); Best Callback Phone Number: [phone]; daughter is calling to report  pt had a MOHS Procedure on his left eyelid on 07/06/12 and on 07/08/12 had eye plastic surgery related to the MOHS Procedure; on 07/09/12 had emergency surgery to stop bleeding; he was admitted to [facility] on 07/09/12 and discharged on 07/12/12;  pt is going to be at [facility] for respite care; daugther denies any need for triage; she just wanted to update the office as to what is going on with her father;

## 2012-07-14 ENCOUNTER — Encounter: Payer: Medicare Other | Admitting: Family

## 2012-07-14 ENCOUNTER — Telehealth: Payer: Self-pay | Admitting: Family

## 2012-07-14 NOTE — Telephone Encounter (Signed)
Needs an INR recheck asap

## 2012-07-14 NOTE — Telephone Encounter (Signed)
Called pts daughter to sch pt another INR check. Daughter informed me that pt is in [facility] and they are suppose to be checking his INR while pt is there.  As soon as pt is discharged from [facility], pt will start coming back in for INR at [facility].

## 2012-07-29 LAB — PROTIME-INR

## 2012-08-14 ENCOUNTER — Telehealth: Payer: Self-pay | Admitting: Internal Medicine

## 2012-08-14 MED ORDER — SERTRALINE HCL 50 MG PO TABS: 50.0000 mg | ORAL_TABLET | Freq: Every day | ORAL | Status: DC

## 2012-08-14 NOTE — Telephone Encounter (Signed)
Please advise 

## 2012-08-14 NOTE — Telephone Encounter (Signed)
Attempt to call- VM - LMTCB if questions - gave dr. Vernon Prey instructions and new dose sent to [facility]

## 2012-08-14 NOTE — Telephone Encounter (Signed)
Ok to increase sertraline to 50mg daily.

## 2012-08-14 NOTE — Telephone Encounter (Signed)
Caller: Ellen/Daughter; Patient Name: Howard Payne; PCP: Eleonore Chiquito; Best Callback Phone Number: [phone]; Reason for call: Asking if MD would consider > dose of Sertraline.  Family is looking to move him from independent living  into assisted living after multiple surgeries and upcoming radiation treatments (mid October).  He is dealing with many professionals and seems more confused & anxious.  Aware MD started him on a lowest dose; asking if there is a standard dose or if MD would consider > dose to help with episodes of confusion/agitation/anxiety.  Feels the Sertraline has been helpful for mood. Last office visit 05/18/12.  Advised to see MD within 72 hours for slowly developing symptoms and had not been evaluated by provider per Confusion, Disorientation, Agitation guideline.  States will call back for appointment for week of 08/17/12 but would like MD input about medication.  Please call back.

## 2012-08-17 ENCOUNTER — Encounter: Payer: Self-pay | Admitting: Radiation Oncology

## 2012-08-18 ENCOUNTER — Ambulatory Visit
Admission: RE | Admit: 2012-08-18 | Discharge: 2012-08-18 | Disposition: A | Discharge status: Home / Self Care | Payer: Medicare Other | Source: Ambulatory Visit | Attending: Radiation Oncology | Admitting: Radiation Oncology

## 2012-08-18 ENCOUNTER — Encounter: Payer: Self-pay | Admitting: Radiation Oncology

## 2012-08-18 ENCOUNTER — Telehealth: Payer: Self-pay | Admitting: *Deleted

## 2012-08-18 VITALS — BP 92/65 | HR 112 | Temp 98.1°F | Resp 20 | Ht 74.5 in | Wt 181.9 lb

## 2012-08-18 DIAGNOSIS — Z79899 Other long term (current) drug therapy: Secondary | ICD-10-CM | POA: Insufficient documentation

## 2012-08-18 DIAGNOSIS — I4891 Unspecified atrial fibrillation: Secondary | ICD-10-CM | POA: Insufficient documentation

## 2012-08-18 DIAGNOSIS — C44121 Squamous cell carcinoma of skin of unspecified eyelid, including canthus: Secondary | ICD-10-CM

## 2012-08-18 DIAGNOSIS — Z7901 Long term (current) use of anticoagulants: Secondary | ICD-10-CM | POA: Insufficient documentation

## 2012-08-18 HISTORY — DX: Unspecified glaucoma: H40.9

## 2012-08-18 HISTORY — DX: Squamous cell carcinoma of skin of unspecified eyelid, including canthus: C44.121

## 2012-08-18 HISTORY — DX: Benign neoplasm, unspecified site: D36.9

## 2012-08-18 HISTORY — DX: Unspecified atrial fibrillation: I48.91

## 2012-08-18 LAB — PROTIME-INR

## 2012-08-18 NOTE — Progress Notes (Signed)
Daughter [name] w/pt today. Pt residing at [facility] home since last surgery, reconstruction eyelid, cheek flap repair s/p hematoma. Prior to that pt lived at [facility].  Pt reports "sore spot on my buttocks". Daughter was unaware until today. Advised her to speak w/caregiver at  [facility] today re: "sore" so appropriate therapy can be initiated. No other c/o verbalized.

## 2012-08-18 NOTE — Telephone Encounter (Signed)
Spoke w/[name], LPN at [facility] nursing home re: pt's c/o "sore" on his buttocks. Informed her pt was wincing and shifting in the chair frequently while this RN was w/him. Also informed her that pt's daughter, [name] is aware and plans to discuss w/nurse as well. She states she will assess pt on his return to facility today.

## 2012-08-18 NOTE — Progress Notes (Signed)
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 male who has a history of multiple skin cancers in the past. 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 He underwent Mohs surgery by Dr. Park Liter on 07/06/2012. I've been in contact personally with Dr. Park Liter and have received photographs of the tumor prior to surgery. The surgical specimen demonstrated an infiltrative pattern of focal poorly differentiated squamous cell carcinoma. There was focal possible perineural invasion of an occasional small nerve, 0.04 mm in diameter. Dr. Park Liter  is highly concerned about the risk of a local recurrence, particularly at the medial canthus and the soft tissues 0-1.5cm deep to the skin surface of the tumor bed.   The patient was also treated Dr. Newt Lukes - an oculoplastic surgeon who performed a cheek lift in order to reconstruct the patient's lower eyelid. The patient follows up with him in a couple weeks to verify that he has healed completely. Dr. Newt Lukes and I have spoken a couple times about the patient as well in the past 24 hours.  The patient is accompanied by his daughter today. He is disoriented with signs of dementia but very pleasant to speak with.  The patient and his daughter acknowledge that he cannot fully close his left eye, postoperatively. He had a somewhat difficult postoperative course with significant swelling, discharge and bleeding but has recovered from this. He receives Gentak to  his eye as well as Systane gel. He is at a nursing home while he recovers.  Of note the patient has macular  degeneration and glaucoma. His eyesight is limited. 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    PAST SURGICAL HISTORY: Past Surgical History  Procedure Date  . Mohs surgery 2006, 07/06/12    Left Lower Eyelid: Invasive Squamous Cell Carcinoma  . Shave biopsy 05/14/12    Left Lower Forehead  . Skin punch biopsy 05/14/12    Mid Left Lower Eyelid    FAMILY HISTORY: family history includes Diabetes in his father; Skin cancer in his sister; and Stroke in his father and mother.  SOCIAL HISTORY:  reports that he has never smoked. He has never used smokeless tobacco. He reports that he does not drink alcohol or use illicit drugs.  ALLERGIES: Review of patient's allergies indicates no known allergies.  MEDICATIONS:  Current Outpatient Prescriptions  Medication Sig Dispense Refill  . digoxin (LANOXIN) 0.25 MG tablet TAKE 1/2 TABLET EVERY DAY  90 tablet  3  . GENTAK 0.3 % ophthalmic ointment       . HYDROcodone-acetaminophen (VICODIN) 5-500 MG per tablet       . latanoprost (XALATAN) 0.005 % ophthalmic solution Place 1 drop into both eyes at bedtime.      Marland Kitchen NAMENDA 5 MG tablet TAKE 1 TABLET EVERY DAY  90 tablet  3  . Polyethyl Glycol-Propyl Glycol (SYSTANE) 0.4-0.3 % GEL Apply to eye every 2 (two) hours.      Marland Kitchen  sertraline (ZOLOFT) 50 MG tablet Take 1 tablet (50 mg total) by mouth daily.  30 tablet  3  . warfarin (COUMADIN) 2.5 MG tablet Take 2.5-3.75 mg by mouth See admin instructions. Take 1 tablet (2.5mg ) by mouth on Monday and Thursday. Take 1 and 1/2 tablets (3.75mg ) on Sunday, Tuesday, Wednesday, Friday and Saturday.      . zolpidem (AMBIEN) 5 MG tablet Take 5 mg by mouth at bedtime as needed.      . travoprost, benzalkonium,  (TRAVATAN) 0.004 % ophthalmic solution Place 1 drop into both eyes at bedtime.       Marland Kitchen VITAMIN D, CHOLECALCIFEROL, PO Take 1 capsule by mouth daily.      Marland Kitchen warfarin (COUMADIN) 2.5 MG tablet TAKE 1 TABLET BY MOUTH SUN,WED,FRI AND TAKE 1 AND 1/2 TABLET ON MON, TUES, THURS, SAT  90 tablet  0    REVIEW OF SYSTEMS:  Notable for that described above.   PHYSICAL EXAM:  height is 6' 2.5" (1.892 m) and weight is 181 lb 14.4 oz (82.509 kg). His oral temperature is 98.1 F (36.7 C). His blood pressure is 92/65 and his pulse is 112. His respiration is 20.   General: He is not oriented to year.  In no acute distress, sleeping for part of the encounter. HEENT:  Extraocular movements are intact. Oropharynx is clear. He is not able to completely close his left eye. The left lower lid, which has been reconstructed, is tight, not supple. Would be very difficult if not impossible to place an eye shield between the globe in the lower left lid. At the medial left canthus he shows exposed mucosa and granulation tissue indicating has not completely healed yet from surgery Neck: Neck is supple, no palpable cervical or supraclavicular lymphadenopathy. Heart: Irregularly irregular in rhythm with no murmurs, rubs, or gallops. Chest: Clear to auscultation bilaterally, with no rhonchi, wheezes, or rales. Abdomen: Soft, nontender, nondistended, with no rigidity or guarding. Extremities: No cyanosis or edema. Lymphatics: No concerning lymphadenopathy. Skin: As above Neurologic: Cranial nerves II through XII are grossly intact. No obvious focalities. Speech is fluent. Coordination is intact. Psychiatric: Signs of dementia   LABORATORY DATA:  Lab Results  Component Value Date   WBC 7.4 05/09/2011   HGB 13.3 04/06/2012   HCT 39.0 04/06/2012   MCV 97.1 05/09/2011   PLT 211.0 05/09/2011    CMP     Component Value Date/Time   NA 140 04/06/2012 2202   K 3.8 04/06/2012 2202   CL 107 04/06/2012 2202   CO2 27 05/09/2011 1017    GLUCOSE 112* 04/06/2012 2202   BUN 16 04/06/2012 2202   CREATININE 1.00 04/06/2012 2202   CALCIUM 9.0 05/09/2011 1017   PROT 6.5 05/09/2011 1017   ALBUMIN 3.8 05/09/2011 1017   AST 30 05/09/2011 1017   ALT 25 05/09/2011 1017   ALKPHOS 55 05/09/2011 1017   BILITOT 1.5* 05/09/2011 1017     RADIOGRAPHY: No results found.    IMPRESSION/PLAN: This is a very pleasant 78 year old gentleman accompanied by his daughter today. We had a very extensive discussion today. I spoke with the patient's daughter by phone again, following further discussion with his skin surgeons.  Ultimately, Dr. Park Liter  is quite concerned about the patient's risk for local recurrence. I explained to the patient, his daughter, and Dr. Park Liter that it's unlikely we will be able to place an eye shield between the globe in the lower lid due to the fact the lower lid is  very tight.   I think the best approach is to use a external lead shield to block the majority of patient's eye while exposing the medial canthus and the majority of the tumor bed so that I can give a curative adjuvant dose the majority of his at-risk tissue. However, I will probably not be able to block much of the mucosa of the reconstructed lower eyelid as this is adjacent to the sclera. Treating this, with inadequate shielding, would raise the risk for ulceration of his eye.  I did speak with the patient's daughter about the fact that we could treat him twice a week for 5 weeks versus 5 days a week for 4 weeks. 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 She is going to think about which regimen she and her family would like to pursue. It is understandably cumbersome for the patient to come to and from the department for treatments. For that reason, I am willing to give the twice a week treatment over 5 weeks if this is  the only feasible option for them.  The patient's daughter is going to call me back after their followup with Dr. Newt Lukes, once the patient is cleared by Dr. Newt Lukes. At that time, we will arrange treatment planning in our department.     I spent 60 minutes minutes face to face with the patient and more than 50% of that time was spent in counseling and/or coordination of care.    __________________________________________   Lonie Peak, MD

## 2012-08-18 NOTE — Progress Notes (Signed)
Please see the Nurse Progress Note in the MD Initial Consult Encounter for this patient. 

## 2012-08-20 NOTE — Addendum Note (Signed)
Encounter addended by: Glennie Hawk, RN on: 08/20/2012  3:20 PM<BR>     Documentation filed: Charges VN

## 2012-08-26 ENCOUNTER — Telehealth: Payer: Self-pay

## 2012-08-26 NOTE — Telephone Encounter (Signed)
Took VM - I think from [place] - seeing pt at [facility] on coumadin - on cell phone? brekaing up - gave INR results - please call and assist with INR mamagement

## 2012-08-26 NOTE — Addendum Note (Signed)
Encounter addended by: Delynn Flavin, RN on: 08/26/2012  3:58 PM<BR>     Documentation filed: Charges VN

## 2012-08-27 NOTE — Telephone Encounter (Signed)
Nurse states that the inr was not drawn because they did not have an order, now given the order pt's inr willl be drawn tomorrow or Monday. She says that she's sure he had it done at his prior facility.

## 2012-08-27 NOTE — Telephone Encounter (Signed)
Left message for nurse to callback with pt/inr results

## 2012-08-28 ENCOUNTER — Telehealth: Payer: Self-pay | Admitting: Internal Medicine

## 2012-08-28 NOTE — Telephone Encounter (Signed)
Adline Mango out of office today - please advise

## 2012-08-28 NOTE — Telephone Encounter (Signed)
[facility] called. Pts PT/INR is 1.2 and coumadin 2 mg daily. Pt is a [facility] patient.

## 2012-08-28 NOTE — Telephone Encounter (Signed)
Spoke with Clydie Braun at [facility] - she asked that I fax order to them at [phone] [facility]  - DONE

## 2012-08-28 NOTE — Addendum Note (Signed)
Encounter addended by: Shirlyn Savin Mintz Noya Santarelli, RN on: 08/28/2012  6:40 PM<BR>     Documentation filed: Charges VN

## 2012-08-28 NOTE — Telephone Encounter (Signed)
Given extra 2 mg of Coumadin today and increase daily Coumadin  To 2.5 mg;  recheck INR in one week

## 2012-09-04 ENCOUNTER — Telehealth: Payer: Self-pay | Admitting: *Deleted

## 2012-09-04 NOTE — Telephone Encounter (Signed)
ok 

## 2012-09-04 NOTE — Telephone Encounter (Signed)
I called [facility].  Hard copy Rx may be faxed to [phone], confirmation received

## 2012-09-04 NOTE — Telephone Encounter (Signed)
Received a fax from [facility], "Can we please have a hard copy of Ambien 5 mg at bedtime prn for this pt".  I do see Ambien 5 mg on pt med list, however it is historical only, I do not see that it had been filled here for this pt yet?  Please advise

## 2012-09-09 ENCOUNTER — Other Ambulatory Visit: Payer: Self-pay | Admitting: Internal Medicine

## 2012-09-09 DIAGNOSIS — R634 Abnormal weight loss: Secondary | ICD-10-CM

## 2012-09-09 DIAGNOSIS — R05 Cough: Secondary | ICD-10-CM

## 2012-09-11 ENCOUNTER — Ambulatory Visit (INDEPENDENT_AMBULATORY_CARE_PROVIDER_SITE_OTHER)
Admission: RE | Admit: 2012-09-11 | Discharge: 2012-09-11 | Disposition: A | Discharge status: Home / Self Care | Payer: Medicare Other | Source: Ambulatory Visit | Attending: Internal Medicine | Admitting: Internal Medicine

## 2012-09-11 DIAGNOSIS — R634 Abnormal weight loss: Secondary | ICD-10-CM

## 2012-09-11 DIAGNOSIS — R05 Cough: Secondary | ICD-10-CM

## 2012-09-15 ENCOUNTER — Ambulatory Visit (INDEPENDENT_AMBULATORY_CARE_PROVIDER_SITE_OTHER): Payer: Medicare Other | Admitting: Internal Medicine

## 2012-09-15 ENCOUNTER — Encounter: Payer: Self-pay | Admitting: Internal Medicine

## 2012-09-15 ENCOUNTER — Telehealth: Payer: Self-pay | Admitting: Internal Medicine

## 2012-09-15 VITALS — BP 110/62 | Wt 188.0 lb

## 2012-09-15 DIAGNOSIS — Z7901 Long term (current) use of anticoagulants: Secondary | ICD-10-CM

## 2012-09-15 DIAGNOSIS — R4189 Other symptoms and signs involving cognitive functions and awareness: Secondary | ICD-10-CM

## 2012-09-15 DIAGNOSIS — R634 Abnormal weight loss: Secondary | ICD-10-CM

## 2012-09-15 DIAGNOSIS — R079 Chest pain, unspecified: Secondary | ICD-10-CM

## 2012-09-15 DIAGNOSIS — I482 Chronic atrial fibrillation, unspecified: Secondary | ICD-10-CM

## 2012-09-15 DIAGNOSIS — I4891 Unspecified atrial fibrillation: Secondary | ICD-10-CM

## 2012-09-15 DIAGNOSIS — F09 Unspecified mental disorder due to known physiological condition: Secondary | ICD-10-CM

## 2012-09-15 LAB — CBC WITH DIFFERENTIAL/PLATELET
Basophils Absolute: 0 10*3/uL (ref 0.0–0.1)
Hemoglobin: 13.4 g/dL (ref 13.0–17.0)
Lymphocytes Relative: 15.9 % (ref 12.0–46.0)
Monocytes Relative: 15.5 % — ABNORMAL HIGH (ref 3.0–12.0)
Neutrophils Relative %: 64.2 % (ref 43.0–77.0)
Platelets: 264 10*3/uL (ref 150.0–400.0)
RDW: 14.8 % — ABNORMAL HIGH (ref 11.5–14.6)

## 2012-09-15 LAB — COMPREHENSIVE METABOLIC PANEL
ALT: 27 U/L (ref 0–53)
CO2: 25 mEq/L (ref 19–32)
Calcium: 8.6 mg/dL (ref 8.4–10.5)
Chloride: 104 mEq/L (ref 96–112)
Creatinine, Ser: 1 mg/dL (ref 0.4–1.5)
GFR: 74.6 mL/min (ref >60.00)
Total Protein: 6.7 g/dL (ref 6.0–8.3)

## 2012-09-15 LAB — TSH: TSH: 2.67 u[IU]/mL (ref 0.35–5.50)

## 2012-09-15 MED ORDER — OMEPRAZOLE 20 MG PO CPDR: 20.0000 mg | DELAYED_RELEASE_CAPSULE | Freq: Every day | ORAL | Status: DC

## 2012-09-15 MED ORDER — SERTRALINE HCL 25 MG PO TABS: 25.0000 mg | ORAL_TABLET | Freq: Every day | ORAL | Status: AC

## 2012-09-15 MED ORDER — SERTRALINE HCL 25 MG PO TABS: 25.0000 mg | ORAL_TABLET | Freq: Every day | ORAL | Status: DC

## 2012-09-15 NOTE — Telephone Encounter (Signed)
Daughter calling to reuest to have rx for prilosec and zoloft that was sent to [facility] cancelled since pt is at [facility] and must get meds through [facility].  Please print and fax new rx to [facility], did not have fax number available.  She will call back shortly with it.

## 2012-09-15 NOTE — Telephone Encounter (Signed)
Re order sent to [facility]

## 2012-09-15 NOTE — Telephone Encounter (Signed)
Fax number is [phone] attn: Marland Kitchen

## 2012-09-15 NOTE — Progress Notes (Signed)
Subjective:    Patient ID: Howard Payne, male    DOB: [birth year], 78 y.o.   MRN: [id number]  HPI  83-year-old patient who is seen today for his six-month followup. He is accompanied by a daughter. There is been some concern of chest pain and weight loss and a chest x-ray was obtained recently that revealed no active cardiopulmonary disease. His weights have been stable since July. He feels quite well today. The patient has cognitive impairment but it seems most his pain is nocturnal. Denies any exertional chest pain. He does have a history of atrial fibrillation and is on chronic Coumadin anticoagulation.  Past Medical History  Diagnosis Date  . Irregular heart rate   . Macular degeneration   . Cognitive impairment   . Hypotension   . 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 Glaucoma   . Atrial fibrillation     History   Social History  . Marital Status: Widowed    Spouse Name: N/A    Number of Children: N/A  . Years of Education: N/A   Occupational History  . Not on file.   Social History Main Topics  . Smoking status: Never Smoker   . Smokeless tobacco: Never Used  . Alcohol Use: No  . Drug Use: No  . Sexually Active: Not on file   Other Topics Concern  . Not on file   Social History Narrative  . No narrative on file    Past Surgical History  Procedure Date  . Mohs surgery 2006, 07/06/12    Left Lower Eyelid: Invasive Squamous Cell Carcinoma  . Shave biopsy 05/14/12    Left Lower Forehead  . Skin punch biopsy 05/14/12    Mid Left Lower Eyelid    Family History  Problem Relation Age of Onset  . Skin cancer Sister     Non-melanoma  . Stroke Mother   . Stroke Father   . Diabetes Father     No Known Allergies  Current Outpatient Prescriptions on File Prior to Visit  Medication Sig Dispense Refill  . digoxin (LANOXIN) 0.25 MG tablet TAKE 1/2 TABLET EVERY  DAY  90 tablet  3  . GENTAK 0.3 % ophthalmic ointment       . HYDROcodone-acetaminophen (VICODIN) 5-500 MG per tablet       . latanoprost (XALATAN) 0.005 % ophthalmic solution Place 1 drop into both eyes at bedtime.      Marland Kitchen NAMENDA 5 MG tablet TAKE 1 TABLET EVERY DAY  90 tablet  3  . Polyethyl Glycol-Propyl Glycol (SYSTANE) 0.4-0.3 % GEL Apply to eye every 2 (two) hours.      . travoprost, benzalkonium, (TRAVATAN) 0.004 % ophthalmic solution Place 1 drop into both eyes at bedtime.       Marland Kitchen warfarin (COUMADIN) 2.5 MG tablet TAKE 1 TABLET BY MOUTH SUN,WED,FRI AND TAKE 1 AND 1/2 TABLET ON MON, TUES, THURS, SAT  90 tablet  0  . zolpidem (AMBIEN) 5 MG tablet Take 5 mg by mouth at bedtime as needed.      Marland Kitchen DISCONTD: warfarin (COUMADIN) 2.5 MG tablet Take 2.5-3.75 mg by mouth See admin instructions. Take 1 tablet (2.5mg ) by mouth on Monday and Thursday. Take 1 and 1/2 tablets (3.75mg ) on Sunday, Tuesday, Wednesday, Friday and Saturday.      Marland Kitchen omeprazole (PRILOSEC) 20 MG capsule Take 1 capsule (20 mg  total) by mouth daily.  30 capsule  11    BP 110/62  Wt 188 lb (85.276 kg)     Wt Readings from Last 3 Encounters:  09/15/12 188 lb (85.276 kg)  08/18/12 181 lb 14.4 oz (82.509 kg)  05/18/12 188 lb (85.276 kg)    Review of Systems  Constitutional: Negative for fever, chills, appetite change and fatigue.  HENT: Negative for hearing loss, ear pain, congestion, sore throat, trouble swallowing, neck stiffness, dental problem, voice change and tinnitus.   Eyes: Negative for pain, discharge and visual disturbance.  Respiratory: Negative for cough, chest tightness, wheezing and stridor.   Cardiovascular: Negative for chest pain, palpitations and leg swelling.  Gastrointestinal: Negative for nausea, vomiting, abdominal pain, diarrhea, constipation, blood in stool and abdominal distention.  Genitourinary: Negative for urgency, hematuria, flank pain, discharge, difficulty urinating and genital sores.    Musculoskeletal: Positive for gait problem. Negative for myalgias, back pain, joint swelling and arthralgias.  Skin: Negative for rash.       Reason no surgery left infraorbital area due to squamous cell skin cancer. There is concern about residual disease involving the skin of the eyelid and the family is contemplating radiation therapy  Neurological: Negative for dizziness, syncope, speech difficulty, weakness, numbness and headaches.  Hematological: Negative for adenopathy. Does not bruise/bleed easily.  Psychiatric/Behavioral: Positive for confusion and decreased concentration. Negative for behavioral problems and dysphoric mood. The patient is not nervous/anxious.        Objective:   Physical Exam  Constitutional: He is oriented to person, place, and time. He appears well-developed.  HENT:  Head: Normocephalic.  Right Ear: External ear normal.  Left Ear: External ear normal.  Eyes: Conjunctivae normal and EOM are normal.  Neck: Normal range of motion.  Cardiovascular: Normal rate and normal heart sounds.        Controlled ventricular response  Pulmonary/Chest: Breath sounds normal.  Abdominal: Bowel sounds are normal.  Musculoskeletal: Normal range of motion. He exhibits no edema and no tenderness.  Neurological: He is alert and oriented to person, place, and time.  Psychiatric: He has a normal mood and affect. His behavior is normal.          Assessment & Plan:   Chronic atrial fibrillation. We'll continue rate control and oral anticoagulation History weight loss. What appears to be stable Nonspecific chest pain. Will give a trial of omeprazole and observe History of squamous cell cancer of the eyelid. Followup dermatology and radiation oncology  Recheck 6 months

## 2012-09-15 NOTE — Patient Instructions (Addendum)
Call or return to clinic prn if these symptoms worsen or fail to improve as anticipated.

## 2012-09-23 ENCOUNTER — Telehealth: Payer: Self-pay | Admitting: Internal Medicine

## 2012-09-23 NOTE — Telephone Encounter (Signed)
Daughter, [name] calling to see about his lab results from last week.  Same noted in EPIC but no new orders or instructions.  She just wants to make sure that MD didn't want to add anything? She can be reached at [phone] and you may leave a message on her vm if she cannot answer.

## 2012-09-24 ENCOUNTER — Telehealth: Payer: Self-pay

## 2012-09-24 ENCOUNTER — Ambulatory Visit (INDEPENDENT_AMBULATORY_CARE_PROVIDER_SITE_OTHER): Payer: Medicare Other | Admitting: Family

## 2012-09-24 DIAGNOSIS — I482 Chronic atrial fibrillation, unspecified: Secondary | ICD-10-CM

## 2012-09-24 DIAGNOSIS — I4891 Unspecified atrial fibrillation: Secondary | ICD-10-CM

## 2012-09-24 LAB — POCT INR: INR: 1.1

## 2012-09-24 NOTE — Telephone Encounter (Signed)
Pt's inr today is 1.1. Pt takes 2.5mg  qd.  Spoke with Padonda. Have pt do 5 mg today, tomorrow, and Saturday, then increase coumadin to 5mg  on TTH and 2.5mg  all other days. rechk in 10 days. Spoke with Duwayne Heck, RN at [facility] to make her aware of instructions. Danielle verbalized understanding and will fax results to office

## 2012-09-24 NOTE — Telephone Encounter (Signed)
Spoke with [name] - informed labs normal

## 2012-09-25 ENCOUNTER — Telehealth: Payer: Self-pay

## 2012-09-25 MED ORDER — HYDROCODONE-ACETAMINOPHEN 5-325 MG PO TABS: 1.0000 | ORAL_TABLET | Freq: Four times a day (QID) | ORAL | Status: DC | PRN

## 2012-09-25 NOTE — Telephone Encounter (Signed)
Ok #[phone] 

## 2012-09-25 NOTE — Telephone Encounter (Signed)
Faxed to [facility] - we dont have other fax#

## 2012-09-25 NOTE — Telephone Encounter (Signed)
Fax refill request from [facility] assisted living for hydrocodone - apap 5-325mg  q6hr prn I only see this as a historical med - I cant see that you have ever written this for him Please advise

## 2012-09-28 ENCOUNTER — Telehealth: Payer: Self-pay | Admitting: Internal Medicine

## 2012-09-28 NOTE — Telephone Encounter (Signed)
Left message to advise nurse Danielle of coumadin instructions. Orders faxed to Tonya at [facility]

## 2012-09-28 NOTE — Telephone Encounter (Signed)
5mg  x 3 days, then increase coumadin to 5mg  on T and TH and 2.5mg  all other days. rechk in 10 days.

## 2012-09-28 NOTE — Patient Instructions (Signed)
5mg  x 3 days, then increase coumadin to 5mg  on TTH and 2.5mg  all other days. rechk in 10 days.

## 2012-09-28 NOTE — Telephone Encounter (Signed)
Tonya from [facility] also called. She needs to know the dosage on Coumadin that pt needs to take tonight.  Because of the missed doses, she does not want to assume that the doses from 11/7 are still correct. Please fax all info to [facility] Attn: Med Tech at [phone].

## 2012-09-28 NOTE — Telephone Encounter (Signed)
Howard Payne from [facility] needs verbal order to recheck PT/INR due to missed dosages. Pt lives at [facility] living. Ok to leave message on Howard Payne cell

## 2012-09-29 ENCOUNTER — Telehealth: Payer: Self-pay | Admitting: Family Medicine

## 2012-09-29 NOTE — Telephone Encounter (Signed)
Pt ends this week. Would like order extension for 2x/wk for 3 wks to work on posture, strengthening, and balance. Please call.

## 2012-09-29 NOTE — Telephone Encounter (Signed)
Ok to extend PT

## 2012-09-30 NOTE — Telephone Encounter (Signed)
Pt notified, verbal order given per Dr. Kirtland Bouchard.

## 2012-10-09 ENCOUNTER — Telehealth: Payer: Self-pay | Admitting: Internal Medicine

## 2012-10-09 LAB — POCT INR: INR: 1.9

## 2012-10-09 NOTE — Telephone Encounter (Signed)
Left message on machine for patient to hold coumadin this weekend and call back Monday for more instructions.  Messages left on home number and [facility] ([facility]) number.

## 2012-10-09 NOTE — Telephone Encounter (Signed)
Howard Payne called to give pt inr results. PT was 22.6 and INR 1.9    Pt takes 5 mgs on Tues and Thurs. And 2.5 mg on all other days. Need to know new dosage. Pls call.

## 2012-10-12 ENCOUNTER — Ambulatory Visit (INDEPENDENT_AMBULATORY_CARE_PROVIDER_SITE_OTHER): Payer: Medicare Other | Admitting: Family

## 2012-10-12 ENCOUNTER — Telehealth: Payer: Self-pay | Admitting: Family

## 2012-10-12 ENCOUNTER — Telehealth: Payer: Self-pay | Admitting: Internal Medicine

## 2012-10-12 DIAGNOSIS — I482 Chronic atrial fibrillation, unspecified: Secondary | ICD-10-CM

## 2012-10-12 DIAGNOSIS — I4891 Unspecified atrial fibrillation: Secondary | ICD-10-CM

## 2012-10-12 NOTE — Telephone Encounter (Signed)
[facility] Triage Call Report Triage Record Num: [id number] Operator: Tana Felts Patient Name: Joslyn Coniglio Call Date & Time: 10/09/2012 8:03:27PM Patient Phone: [phone] PCP: Gordy Savers Patient Gender: Male PCP Fax : [phone] Patient DOB: February 18, 1920 [facility] Name: [facility]  Reason for Call: addendum: orders were faxed to [facility].  Protocol(s) Used: Office Note  Recommended Outcome per Protocol: Information Noted and Sent to Office  Reason for Outcome: Caller information to office Care Advice:

## 2012-10-12 NOTE — Telephone Encounter (Signed)
[facility] Triage Call Report Triage Record Num: [id number] Operator: Tana Felts Patient Name: Devron Cohick Call Date & Time: 10/09/2012 7:02:38PM Patient Phone: [phone] PCP: Gordy Savers Patient Gender: Male PCP Fax : [phone] Patient DOB: 1920/06/24 Practice Name: [facility]  Reason for Call: Caller: Courtney/Med Tech at [facility]; PCP: Eleonore Chiquito [facility]); CB#: [phone]; Onset 10/09/12 Call regarding to verify orders for coumadin; PT/INR drawn today. INR 1.9/PT 22.6. Message was left to hold Coumadin this weekend and to follow up with office on 10/12/12. REviewed EPIC, 10/09/12 noted confirmed holding Coumadin for the weekend. Called MD oncall to confirm. Spoke with Dr. Patsy Lager, advised No changes to Coumadin, recheck INR in 2 weeks.  Protocol(s) Used: Medication Questions - Adult Recommended Outcome per Protocol: Call Provider within 4 Hours Reason for Outcome: Prescription ordered today and not available at pharmacy putting patient at clinical risk

## 2012-10-12 NOTE — Patient Instructions (Signed)
Take an extra 1/2 tab today only. Then continue Take 5mg  x 3 days then 5mg  on Tuesday and Thursday. 2.5mg  all other days. Recheck in 3 weeks.    Latest dosing instructions   Total Sun Mon Tue Wed Thu Fri Sat   22.5 2.5 mg 2.5 mg 5 mg 2.5 mg 5 mg 2.5 mg 2.5 mg    (2.5 mg1) (2.5 mg1) (2.5 mg2) (2.5 mg1) (2.5 mg2) (2.5 mg1) (2.5 mg1)

## 2012-10-12 NOTE — Telephone Encounter (Signed)
Left message for [facility] nurse to return call to office.  Spoke with pt's daughter, who provided # for [employer] med Building services engineer with med Scientist, product/process development at [employer]. Pt did receive 2.5 mg of warfarin on Sat and Sun. Advised tech of NP's instructions to have pt take extra 1/2 tab today only and continue same dose. Order faxed to [phone]

## 2012-10-12 NOTE — Telephone Encounter (Signed)
Please find out if patient held his coumadin over the weekend. If he did, take 10mg  x 2 days, then resume normal dose and recheck in 10 days.   If he continued the same dose, take an extra 1/2 tab today only and continue the same dose.   Let me know what he did over the weekend.

## 2012-10-22 LAB — PROTIME-INR: INR: 1.3 — AB (ref 0.9–1.1)

## 2012-10-23 ENCOUNTER — Ambulatory Visit (INDEPENDENT_AMBULATORY_CARE_PROVIDER_SITE_OTHER): Payer: Medicare Other | Admitting: Family

## 2012-10-23 DIAGNOSIS — I482 Chronic atrial fibrillation, unspecified: Secondary | ICD-10-CM

## 2012-10-23 DIAGNOSIS — I4891 Unspecified atrial fibrillation: Secondary | ICD-10-CM

## 2012-10-23 LAB — POCT INR: INR: 1.3

## 2012-10-23 NOTE — Patient Instructions (Signed)
5mg  x 2 days.  Then increase to 5mg  on Tuesday, Thursday, and Saturday. 2.5mg  all other days. Recheck in 2 weeks.    Latest dosing instructions   Total Glynis Smiles Tue Wed Thu Fri Sat   25 2.5 mg 2.5 mg 5 mg 2.5 mg 5 mg 2.5 mg 5 mg    (2.5 mg1) (2.5 mg1) (2.5 mg2) (2.5 mg1) (2.5 mg2) (2.5 mg1) (2.5 mg2)

## 2012-10-31 ENCOUNTER — Encounter (HOSPITAL_COMMUNITY): Payer: Self-pay | Admitting: Emergency Medicine

## 2012-10-31 ENCOUNTER — Emergency Department (HOSPITAL_COMMUNITY): Payer: Medicare Other

## 2012-10-31 ENCOUNTER — Emergency Department (HOSPITAL_COMMUNITY)
Admission: EM | Admit: 2012-10-31 | Discharge: 2012-11-01 | Disposition: A | Discharge status: Home / Self Care | Payer: Medicare Other | Attending: Emergency Medicine | Admitting: Emergency Medicine

## 2012-10-31 DIAGNOSIS — Z8669 Personal history of other diseases of the nervous system and sense organs: Secondary | ICD-10-CM | POA: Insufficient documentation

## 2012-10-31 DIAGNOSIS — Z7901 Long term (current) use of anticoagulants: Secondary | ICD-10-CM | POA: Insufficient documentation

## 2012-10-31 DIAGNOSIS — W1789XA Other fall from one level to another, initial encounter: Secondary | ICD-10-CM | POA: Insufficient documentation

## 2012-10-31 DIAGNOSIS — Z872 Personal history of diseases of the skin and subcutaneous tissue: Secondary | ICD-10-CM | POA: Insufficient documentation

## 2012-10-31 DIAGNOSIS — IMO0002 Reserved for concepts with insufficient information to code with codable children: Secondary | ICD-10-CM

## 2012-10-31 DIAGNOSIS — H409 Unspecified glaucoma: Secondary | ICD-10-CM | POA: Insufficient documentation

## 2012-10-31 DIAGNOSIS — Z79899 Other long term (current) drug therapy: Secondary | ICD-10-CM | POA: Insufficient documentation

## 2012-10-31 DIAGNOSIS — Z23 Encounter for immunization: Secondary | ICD-10-CM | POA: Insufficient documentation

## 2012-10-31 DIAGNOSIS — Y939 Activity, unspecified: Secondary | ICD-10-CM | POA: Insufficient documentation

## 2012-10-31 DIAGNOSIS — F0390 Unspecified dementia without behavioral disturbance: Secondary | ICD-10-CM | POA: Insufficient documentation

## 2012-10-31 DIAGNOSIS — W1809XA Striking against other object with subsequent fall, initial encounter: Secondary | ICD-10-CM | POA: Insufficient documentation

## 2012-10-31 DIAGNOSIS — Y9210 Unspecified residential institution as the place of occurrence of the external cause: Secondary | ICD-10-CM | POA: Insufficient documentation

## 2012-10-31 DIAGNOSIS — Z8679 Personal history of other diseases of the circulatory system: Secondary | ICD-10-CM | POA: Insufficient documentation

## 2012-10-31 DIAGNOSIS — S0180XA Unspecified open wound of other part of head, initial encounter: Secondary | ICD-10-CM | POA: Insufficient documentation

## 2012-10-31 DIAGNOSIS — I4891 Unspecified atrial fibrillation: Secondary | ICD-10-CM | POA: Insufficient documentation

## 2012-10-31 DIAGNOSIS — W19XXXA Unspecified fall, initial encounter: Secondary | ICD-10-CM

## 2012-10-31 DIAGNOSIS — Z85828 Personal history of other malignant neoplasm of skin: Secondary | ICD-10-CM | POA: Insufficient documentation

## 2012-10-31 LAB — URINE MICROSCOPIC-ADD ON

## 2012-10-31 LAB — URINALYSIS, ROUTINE W REFLEX MICROSCOPIC
Ketones, ur: NEGATIVE mg/dL
Leukocytes, UA: NEGATIVE
Nitrite: NEGATIVE
Specific Gravity, Urine: 1.02 (ref 1.005–1.030)
Urobilinogen, UA: 1 mg/dL (ref 0.0–1.0)
pH: 6.5 (ref 5.0–8.0)

## 2012-10-31 LAB — PROTIME-INR: INR: 2.32 — ABNORMAL HIGH (ref 0.00–1.49)

## 2012-10-31 LAB — BASIC METABOLIC PANEL
Chloride: 101 mEq/L (ref 96–112)
GFR calc Af Amer: 74 mL/min — ABNORMAL LOW (ref >90)
GFR calc non Af Amer: 64 mL/min — ABNORMAL LOW (ref >90)
Potassium: 4.7 mEq/L (ref 3.5–5.1)

## 2012-10-31 LAB — CBC
Platelets: 210 10*3/uL (ref 150–400)
RDW: 13.5 % (ref 11.5–15.5)
WBC: 8.5 10*3/uL (ref 4.0–10.5)

## 2012-10-31 LAB — APTT: aPTT: 36 seconds (ref 24–37)

## 2012-10-31 NOTE — ED Provider Notes (Signed)
History     CSN: [id number]  Arrival date & time 10/31/12  2118   First MD Initiated Contact with Patient 10/31/12 2152      Chief Complaint  Patient presents with  . Fall    (Consider location/radiation/quality/duration/timing/severity/associated sxs/prior treatment) Patient is a 78 y.o. male presenting with fall. The history is provided by the patient.  Fall The accident occurred 1 to 2 hours ago. The fall occurred in unknown circumstances. He fell from a height of 3 to 5 ft. He landed on a hard floor. The volume of blood lost was minimal. The point of impact was the head. Pain location: no pain. The pain is at a severity of 0/10. The patient is experiencing no pain. He was ambulatory at the scene. There was no entrapment after the fall. There was no drug use involved in the accident. There was no alcohol use involved in the accident. Pertinent negatives include no fever, no abdominal pain and no vomiting. Associated symptoms comments: Unknown if he had loss of consciousness. He has tried nothing for the symptoms.    Past Medical History  Diagnosis Date  . Irregular heart rate   . Macular degeneration   . Cognitive impairment   . Hypotension   . 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 Glaucoma   . Atrial fibrillation     Past Surgical History  Procedure Date  . Mohs surgery 2006, 07/06/12    Left Lower Eyelid: Invasive Squamous Cell Carcinoma  . Shave biopsy 05/14/12    Left Lower Forehead  . Skin punch biopsy 05/14/12    Mid Left Lower Eyelid    Family History  Problem Relation Age of Onset  . Skin cancer Sister     Non-melanoma  . Stroke Mother   . Stroke Father   . Diabetes Father     History  Substance Use Topics  . Smoking status: Never Smoker   . Smokeless tobacco: Never Used  . Alcohol Use: No      Review of Systems  Constitutional: Negative for  fever and chills.  Respiratory: Negative for cough and shortness of breath.   Gastrointestinal: Negative for vomiting and abdominal pain.  All other systems reviewed and are negative.    Allergies  Review of patient's allergies indicates no known allergies.  Home Medications   Current Outpatient Rx  Name  Route  Sig  Dispense  Refill  . DIGOXIN 0.25 MG PO TABS   Oral   Take 0.25 mg by mouth daily.         Marland Kitchen DIPHENHYDRAMINE HCL (SLEEP) 25 MG PO TABS   Oral   Take 25 mg by mouth at bedtime as needed. itching         . GENTAK 0.3 % OP OINT   Left Eye   Place 1 application into the left eye at bedtime.          Marland Kitchen HYDROCODONE-ACETAMINOPHEN 5-325 MG PO TABS   Oral   Take 1 tablet by mouth every 6 (six) hours as needed for pain.   60 tablet   0   . LATANOPROST 0.005 % OP SOLN   Both Eyes   Place 1 drop into both eyes at bedtime.         Marland Kitchen MEMANTINE HCL 10 MG PO TABS   Oral   Take 10 mg by  mouth 2 (two) times daily.         Marland Kitchen OMEPRAZOLE 20 MG PO CPDR   Oral   Take 1 capsule (20 mg total) by mouth daily.   30 capsule   11   . SYSTANE OP   Ophthalmic   Apply 1 drop to eye daily as needed.         . SERTRALINE HCL 25 MG PO TABS   Oral   Take 1 tablet (25 mg total) by mouth daily.   30 tablet   11   . TRIAMCINOLONE ACETONIDE 0.025 % EX CREA   Topical   Apply 1 application topically daily as needed. itching         . WARFARIN SODIUM 2.5 MG PO TABS      TAKE 1 TABLET BY MOUTH SUN,WED,FRI AND TAKE 1 AND 1/2 TABLET ON MON, TUES, THURS, SAT   90 tablet   0   . WARFARIN SODIUM 5 MG PO TABS   Oral   Take 5 mg by mouth daily. Take every Tuesday,and thursday         . ZOLPIDEM TARTRATE 5 MG PO TABS   Oral   Take 5 mg by mouth at bedtime as needed. Foe sleep           BP 118/65  Pulse 99  Temp 99 F (37.2 C) (Oral)  Resp 18  SpO2 96%  Physical Exam  Nursing note and vitals reviewed. Constitutional: He appears well-developed and  well-nourished. No distress.  HENT:  Head: Normocephalic and atraumatic.    Mouth/Throat: No oropharyngeal exudate.  Eyes: EOM are normal. Pupils are equal, round, and reactive to light. Right eye exhibits no discharge. Left eye exhibits no discharge.       EOMs normal, no signs of entrapment  Neck: Normal range of motion. Neck supple.  Cardiovascular: Normal rate and regular rhythm.  Exam reveals no friction rub.   No murmur heard. Pulmonary/Chest: Effort normal and breath sounds normal. No respiratory distress. He has no wheezes. He has no rales.  Abdominal: He exhibits no distension. There is no tenderness. There is no rebound.  Musculoskeletal: Normal range of motion. He exhibits no edema.       Cervical back: He exhibits no bony tenderness.       Thoracic back: He exhibits no bony tenderness.       Lumbar back: He exhibits no bony tenderness.  Neurological: He is alert. No cranial nerve deficit. He exhibits normal muscle tone.  Skin: He is not diaphoretic.    ED Course  Procedures (including critical care time)  Labs Reviewed - No data to display No results found.   No diagnosis found.   Date: 11/01/2012  Rate: 96  Rhythm: Afib  QRS Axis: left  Intervals: QTc prolonged  ST/T Wave abnormalities: nonspecific ST changes  Conduction Disutrbances:none  Narrative Interpretation:   Old EKG Reviewed: none available    MDM   Patient is a 78 year old male who fell at his nursing facility. Unknown circumstances around the fall. Unclear if he had episode of syncope or just fell. Patient years doing well. He is alert however disoriented to date. He does have baseline dementia. He is not complaining of any pain. Injury I can find is a small laceration which is not amenable to sutures on his left lateral eyebrow. He has normal extraocular movements and normal pupils. I'm not concerned about it entrapment. He has no spinal tenderness at all. He is on  Coumadin. I will check baseline  lab studies and imaging. CT of his head and face are normal. CT of the C-spine showed a possible endplate fracture of T2 vertebrae. He is not tender at this point which I feel is consistent with a chronic or remote fracture. All of his lab work is normal. He is therapeutic on his Coumadin. He is in controlled rate atrial fibrillation. CXR negative. Stable for discharge, instructed to f/u with PCP.   Elwin Mocha, MD 11/01/12 0040

## 2012-10-31 NOTE — ED Notes (Signed)
Patient fell at assisted living facility, does not remember what he was doing at the time of fall.  He is CAOx3 at this time.  Patient is on Coumadin.  Patient also having pain in left wrist.

## 2012-11-01 MED ORDER — TETANUS-DIPHTH-ACELL PERTUSSIS 5-2.5-18.5 LF-MCG/0.5 IM SUSP
0.5000 mL | Freq: Once | INTRAMUSCULAR | Status: AC
  Administered 2012-11-01: 0.5 mL via INTRAMUSCULAR
  Filled 2012-11-01: qty 0.5

## 2012-11-01 NOTE — ED Provider Notes (Signed)
I saw and evaluated the patient, reviewed the resident's note and I agree with the findings and plan.  Results for orders placed during the hospital encounter of 10/31/12  CBC      Component Value Range   WBC 8.5  4.0 - 10.5 K/uL   RBC 4.38  4.22 - 5.81 MIL/uL   Hemoglobin 13.5  13.0 - 17.0 g/dL   HCT 16.1  09.6 - 04.5 %   MCV 90.2  78.0 - 100.0 fL   MCH 30.8  26.0 - 34.0 pg   MCHC 34.2  30.0 - 36.0 g/dL   RDW 40.9  81.1 - 91.4 %   Platelets 210  150 - 400 K/uL  BASIC METABOLIC PANEL      Component Value Range   Sodium 135  135 - 145 mEq/L   Potassium 4.7  3.5 - 5.1 mEq/L   Chloride 101  96 - 112 mEq/L   CO2 26  19 - 32 mEq/L   Glucose, Bld 115 (*) 70 - 99 mg/dL   BUN 19  6 - 23 mg/dL   Creatinine, Ser 7.82  0.50 - 1.35 mg/dL   Calcium 9.1  8.4 - 95.6 mg/dL   GFR calc non Af Amer 64 (*) >90 mL/min   GFR calc Af Amer 74 (*) >90 mL/min  URINALYSIS, ROUTINE W REFLEX MICROSCOPIC      Component Value Range   Color, Urine YELLOW  YELLOW   APPearance CLEAR  CLEAR   Specific Gravity, Urine 1.020  1.005 - 1.030   pH 6.5  5.0 - 8.0   Glucose, UA NEGATIVE  NEGATIVE mg/dL   Hgb urine dipstick SMALL (*) NEGATIVE   Bilirubin Urine NEGATIVE  NEGATIVE   Ketones, ur NEGATIVE  NEGATIVE mg/dL   Protein, ur NEGATIVE  NEGATIVE mg/dL   Urobilinogen, UA 1.0  0.0 - 1.0 mg/dL   Nitrite NEGATIVE  NEGATIVE   Leukocytes, UA NEGATIVE  NEGATIVE  PROTIME-INR      Component Value Range   Prothrombin Time 24.4 (*) 11.6 - 15.2 seconds   INR 2.32 (*) 0.00 - 1.49  APTT      Component Value Range   aPTT 36  24 - 37 seconds  POCT I-STAT TROPONIN I      Component Value Range   Troponin i, poc 0.00  0.00 - 0.08 ng/mL   Comment 3           URINE MICROSCOPIC-ADD ON      Component Value Range   Squamous Epithelial / LPF RARE  RARE   WBC, UA 0-2  <3 WBC/hpf   RBC / HPF 3-6  <3 RBC/hpf   Bacteria, UA RARE  RARE   Ct Head Wo Contrast  10/31/2012  *RADIOLOGY REPORT*  Clinical Data:  The patient fell.   CT HEAD WITHOUT CONTRAST CT MAXILLOFACIAL WITHOUT CONTRAST CT CERVICAL SPINE WITHOUT CONTRAST  Technique:  Multidetector CT imaging of the head, cervical spine, and maxillofacial structures were performed using the standard protocol without intravenous contrast. Multiplanar CT image reconstructions of the cervical spine and maxillofacial structures were also generated.  Comparison:   None  CT HEAD  Findings: Technically limited study due to motion artifact. Diffuse bilateral cerebral and cerebellar atrophy.  No ventricular dilatation.  Low attenuation change throughout the deep white matter consistent with small vessel ischemia.  No mass effect or midline shift.  No abnormal extra-axial fluid collections.  Wallace Cullens- white matter junctions are distinct.  Basal cisterns are not effaced.  No evidence of acute intracranial hemorrhage.  No depressed skull fractures.  Mastoid air cells are not opacified. Vascular calcifications.  IMPRESSION: No acute intracranial abnormalities.  Chronic atrophy and small vessel ischemic changes.  CT MAXILLOFACIAL  Findings:  Retention cysts and / or mucosal membrane thickening in the maxillary antra and ethmoid air cells.  No acute air-fluid levels are appreciated in the paranasal sinuses.  The globes and extraocular muscles appear intact and symmetrical.  The nasal bones, nasal septum, nasal spine, maxilla, orbital rims, maxillary antral walls, pterygoid plates, zygomatic arches, mandibles, and temporomandibular joints appear intact.  No displaced fractures are identified.  Mild left periorbital soft tissue swelling.  Previous tooth extractions with bony cavity in the posterior left mandible.  IMPRESSION: Inflammatory changes in the paranasal sinuses.  No displaced orbital or facial fractures identified.  CT CERVICAL SPINE  Findings:   Increased lordosis of the cervical spine likely degenerative.  Diffuse bone demineralization.  There is anterior compression of the superior endplate of T2  with linear sclerosis. There are associated endplate hypertrophic changes and no significant soft tissue swelling.  However, acute compression with impaction is not excluded.  No evidence of anterior displacement. Slight anterior subluxation of C5 on C6 is likely degenerative. Otherwise normal alignment of the cervical vertebrae and facet joints.  There are diffuse degenerative changes throughout the cervical spine involving cervical interspaces, C1-2, and facet joints bilaterally.  Vacuum changes in the facet joints and at C6- 7.  No prevertebral soft tissue swelling.  Intervertebral disc space narrowing due to degenerative change.  No destructive or expansile bone lesions.  Vascular calcifications.  IMPRESSION: Degenerative changes throughout the cervical spine.  Age indeterminate compression of the anterior superior endplate of T2 with sclerosis.  Acute compression fracture is not excluded.  Results were discussed by telephone with Elwin Mocha at 2310 hours on 10/31/2012.   Original Report Authenticated By: Burman Nieves, M.D.    Ct Cervical Spine Wo Contrast  10/31/2012  *RADIOLOGY REPORT*  Clinical Data:  The patient fell.  CT HEAD WITHOUT CONTRAST CT MAXILLOFACIAL WITHOUT CONTRAST CT CERVICAL SPINE WITHOUT CONTRAST  Technique:  Multidetector CT imaging of the head, cervical spine, and maxillofacial structures were performed using the standard protocol without intravenous contrast. Multiplanar CT image reconstructions of the cervical spine and maxillofacial structures were also generated.  Comparison:   None  CT HEAD  Findings: Technically limited study due to motion artifact. Diffuse bilateral cerebral and cerebellar atrophy.  No ventricular dilatation.  Low attenuation change throughout the deep white matter consistent with small vessel ischemia.  No mass effect or midline shift.  No abnormal extra-axial fluid collections.  Wallace Cullens- white matter junctions are distinct.  Basal cisterns are not effaced.  No  evidence of acute intracranial hemorrhage.  No depressed skull fractures.  Mastoid air cells are not opacified. Vascular calcifications.  IMPRESSION: No acute intracranial abnormalities.  Chronic atrophy and small vessel ischemic changes.  CT MAXILLOFACIAL  Findings:  Retention cysts and / or mucosal membrane thickening in the maxillary antra and ethmoid air cells.  No acute air-fluid levels are appreciated in the paranasal sinuses.  The globes and extraocular muscles appear intact and symmetrical.  The nasal bones, nasal septum, nasal spine, maxilla, orbital rims, maxillary antral walls, pterygoid plates, zygomatic arches, mandibles, and temporomandibular joints appear intact.  No displaced fractures are identified.  Mild left periorbital soft tissue swelling.  Previous tooth extractions with bony cavity in the posterior left mandible.  IMPRESSION: Inflammatory changes in  the paranasal sinuses.  No displaced orbital or facial fractures identified.  CT CERVICAL SPINE  Findings:   Increased lordosis of the cervical spine likely degenerative.  Diffuse bone demineralization.  There is anterior compression of the superior endplate of T2 with linear sclerosis. There are associated endplate hypertrophic changes and no significant soft tissue swelling.  However, acute compression with impaction is not excluded.  No evidence of anterior displacement. Slight anterior subluxation of C5 on C6 is likely degenerative. Otherwise normal alignment of the cervical vertebrae and facet joints.  There are diffuse degenerative changes throughout the cervical spine involving cervical interspaces, C1-2, and facet joints bilaterally.  Vacuum changes in the facet joints and at C6- 7.  No prevertebral soft tissue swelling.  Intervertebral disc space narrowing due to degenerative change.  No destructive or expansile bone lesions.  Vascular calcifications.  IMPRESSION: Degenerative changes throughout the cervical spine.  Age indeterminate  compression of the anterior superior endplate of T2 with sclerosis.  Acute compression fracture is not excluded.  Results were discussed by telephone with Elwin Mocha at 2310 hours on 10/31/2012.   Original Report Authenticated By: Burman Nieves, M.D.    Ct Maxillofacial Wo Cm  10/31/2012  *RADIOLOGY REPORT*  Clinical Data:  The patient fell.  CT HEAD WITHOUT CONTRAST CT MAXILLOFACIAL WITHOUT CONTRAST CT CERVICAL SPINE WITHOUT CONTRAST  Technique:  Multidetector CT imaging of the head, cervical spine, and maxillofacial structures were performed using the standard protocol without intravenous contrast. Multiplanar CT image reconstructions of the cervical spine and maxillofacial structures were also generated.  Comparison:   None  CT HEAD  Findings: Technically limited study due to motion artifact. Diffuse bilateral cerebral and cerebellar atrophy.  No ventricular dilatation.  Low attenuation change throughout the deep white matter consistent with small vessel ischemia.  No mass effect or midline shift.  No abnormal extra-axial fluid collections.  Wallace Cullens- white matter junctions are distinct.  Basal cisterns are not effaced.  No evidence of acute intracranial hemorrhage.  No depressed skull fractures.  Mastoid air cells are not opacified. Vascular calcifications.  IMPRESSION: No acute intracranial abnormalities.  Chronic atrophy and small vessel ischemic changes.  CT MAXILLOFACIAL  Findings:  Retention cysts and / or mucosal membrane thickening in the maxillary antra and ethmoid air cells.  No acute air-fluid levels are appreciated in the paranasal sinuses.  The globes and extraocular muscles appear intact and symmetrical.  The nasal bones, nasal septum, nasal spine, maxilla, orbital rims, maxillary antral walls, pterygoid plates, zygomatic arches, mandibles, and temporomandibular joints appear intact.  No displaced fractures are identified.  Mild left periorbital soft tissue swelling.  Previous tooth extractions  with bony cavity in the posterior left mandible.  IMPRESSION: Inflammatory changes in the paranasal sinuses.  No displaced orbital or facial fractures identified.  CT CERVICAL SPINE  Findings:   Increased lordosis of the cervical spine likely degenerative.  Diffuse bone demineralization.  There is anterior compression of the superior endplate of T2 with linear sclerosis. There are associated endplate hypertrophic changes and no significant soft tissue swelling.  However, acute compression with impaction is not excluded.  No evidence of anterior displacement. Slight anterior subluxation of C5 on C6 is likely degenerative. Otherwise normal alignment of the cervical vertebrae and facet joints.  There are diffuse degenerative changes throughout the cervical spine involving cervical interspaces, C1-2, and facet joints bilaterally.  Vacuum changes in the facet joints and at C6- 7.  No prevertebral soft tissue swelling.  Intervertebral disc space narrowing  due to degenerative change.  No destructive or expansile bone lesions.  Vascular calcifications.  IMPRESSION: Degenerative changes throughout the cervical spine.  Age indeterminate compression of the anterior superior endplate of T2 with sclerosis.  Acute compression fracture is not excluded.  Results were discussed by telephone with Elwin Mocha at 2310 hours on 10/31/2012.   Original Report Authenticated By: Burman Nieves, M.D.     Patient seen by me. 78 year old male, resident of a nursing facility assisted living facility. Patient had a fall and striking the less eyebrow area of his head. Currently he is alert and oriented x3 patient is on Coumadin. CT of head without any increase findings or any skull fracture. Maxillofacial without any orbital fractures. Patient had reconstructive surgery of his left lower lid. No change in that does have a stellate-type crush laceration not deep around the left eyebrow. Suturing not required can be treated with wound care and  bacitracin ointment. Patient's CT did show some concern for enters per endplate of T2 that most likely is old. Patient does be discharged back to the nursing facility. Patient's INR is therapeutic.   EKG reviewed and agree with the resident's findings.  Shelda Jakes, MD 11/01/12 [phone]

## 2012-11-02 ENCOUNTER — Telehealth: Payer: Self-pay | Admitting: Internal Medicine

## 2012-11-02 NOTE — Telephone Encounter (Signed)
Received orders signed by Dr. Amador Cunas and faxed back. Denied order for x-ray wants to evaluate pt first. Pt scheduled for an appointment tomorrow for evaluation.

## 2012-11-02 NOTE — Telephone Encounter (Signed)
Howard Payne w/ [facility] needs to bring pt in for ED fup, but their transportation only runs on Mon, Tues, and Thurs. They would like to bring him in Thurs, but only 15 min appt with Same days after. He needs a 30 min. Can I work in? Pls at [phone] They have sent order for a mobile XRAY, but it takes so long to hear back. He has bruise on upper back and complaining it hurts. Transferred to [facility].Marland KitchenMarland KitchenMarland Kitchen

## 2012-11-02 NOTE — ED Provider Notes (Signed)
I saw and evaluated the patient, reviewed the resident's note and I agree with the findings and plan.   Shelda Jakes, MD 11/02/12 [phone]

## 2012-11-03 ENCOUNTER — Encounter: Payer: Self-pay | Admitting: Internal Medicine

## 2012-11-03 ENCOUNTER — Ambulatory Visit (INDEPENDENT_AMBULATORY_CARE_PROVIDER_SITE_OTHER): Payer: Medicare Other | Admitting: Internal Medicine

## 2012-11-03 VITALS — BP 100/60 | HR 68 | Temp 97.7°F | Resp 20

## 2012-11-03 DIAGNOSIS — I4891 Unspecified atrial fibrillation: Secondary | ICD-10-CM

## 2012-11-03 DIAGNOSIS — Z7901 Long term (current) use of anticoagulants: Secondary | ICD-10-CM

## 2012-11-03 NOTE — Patient Instructions (Signed)
Call or return to clinic prn if these symptoms worsen or fail to improve as anticipated.

## 2012-11-03 NOTE — Progress Notes (Signed)
Subjective:    Patient ID: Howard Payne, male    DOB: 1920-10-20, 78 y.o.   MRN: [id number]  HPI  78 -year-old patient who is seen today complaining of back pain.  He's had a recent ED visit for evaluation of a fall later at the extended care facility began having more right-sided low back pain. Emergency room records and x-rays reviewed patient is accompanied by his daughter.  Past Medical History  Diagnosis Date  . Irregular heart rate   . Macular degeneration   . Cognitive impairment   . Hypotension   . 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 Glaucoma   . Atrial fibrillation     History   Social History  . Marital Status: Widowed    Spouse Name: N/A    Number of Children: N/A  . Years of Education: N/A   Occupational History  . Not on file.   Social History Main Topics  . Smoking status: Never Smoker   . Smokeless tobacco: Never Used  . Alcohol Use: No  . Drug Use: No  . Sexually Active: Not on file   Other Topics Concern  . Not on file   Social History Narrative  . No narrative on file    Past Surgical History  Procedure Date  . Mohs surgery 2006, 07/06/12    Left Lower Eyelid: Invasive Squamous Cell Carcinoma  . Shave biopsy 05/14/12    Left Lower Forehead  . Skin punch biopsy 05/14/12    Mid Left Lower Eyelid    Family History  Problem Relation Age of Onset  . Skin cancer Sister     Non-melanoma  . Stroke Mother   . Stroke Father   . Diabetes Father     No Known Allergies  Current Outpatient Prescriptions on File Prior to Visit  Medication Sig Dispense Refill  . digoxin (LANOXIN) 0.25 MG tablet Take 0.25 mg by mouth daily.      . GENTAK 0.3 % ophthalmic ointment Place 1 application into the left eye at bedtime.       Marland Kitchen HYDROcodone-acetaminophen (NORCO/VICODIN) 5-325 MG per tablet Take 1 tablet by mouth every 6 (six) hours as needed for pain.   60 tablet  0  . latanoprost (XALATAN) 0.005 % ophthalmic solution Place 1 drop into both eyes at bedtime.      . memantine (NAMENDA) 10 MG tablet Take 10 mg by mouth 2 (two) times daily.      Marland Kitchen omeprazole (PRILOSEC) 20 MG capsule Take 1 capsule (20 mg total) by mouth daily.  30 capsule  11  . Polyethyl Glycol-Propyl Glycol (SYSTANE OP) Apply 1 drop to eye daily as needed.      . sertraline (ZOLOFT) 25 MG tablet Take 1 tablet (25 mg total) by mouth daily.  30 tablet  11  . triamcinolone (KENALOG) 0.025 % cream Apply 1 application topically daily as needed. itching      . warfarin (COUMADIN) 2.5 MG tablet TAKE 1 TABLET BY MOUTH SUN,WED,FRI AND TAKE 1 AND 1/2 TABLET ON MON, TUES, THURS, SAT  90 tablet  0  . warfarin (COUMADIN) 5 MG tablet Take 5 mg by mouth daily. Take every Tuesday,and thursday      . zolpidem (AMBIEN) 5 MG tablet Take 5 mg by mouth at bedtime as needed. Foe sleep      . diphenhydrAMINE [facility])  25 MG tablet Take 25 mg by mouth at bedtime as needed. itching        BP 100/60  Pulse 68  Temp 97.7 F (36.5 C) (Oral)  Resp 20  SpO2 97%       Review of Systems  Constitutional: Negative for fever, chills, appetite change and fatigue.  HENT: Negative for hearing loss, ear pain, congestion, sore throat, trouble swallowing, neck stiffness, dental problem, voice change and tinnitus.   Eyes: Negative for pain, discharge and visual disturbance.  Respiratory: Negative for cough, chest tightness, wheezing and stridor.   Cardiovascular: Negative for chest pain, palpitations and leg swelling.  Gastrointestinal: Negative for nausea, vomiting, abdominal pain, diarrhea, constipation, blood in stool and abdominal distention.  Genitourinary: Negative for urgency, hematuria, flank pain, discharge, difficulty urinating and genital sores.  Musculoskeletal: Positive for back pain. Negative for myalgias, joint swelling, arthralgias and gait problem.  Skin: Negative for rash.  Neurological:  Negative for dizziness, syncope, speech difficulty, weakness, numbness and headaches.  Hematological: Negative for adenopathy. Does not bruise/bleed easily.  Psychiatric/Behavioral: Positive for confusion and decreased concentration. Negative for behavioral problems and dysphoric mood. The patient is not nervous/anxious.        Objective:   Physical Exam  Constitutional: He appears well-developed and well-nourished. No distress.       Frail Cognitive impairment  Pulmonary/Chest: Effort normal and breath sounds normal. No respiratory distress. He has no wheezes. He has no rales. He exhibits tenderness.       Ecchymoses over the right lower posterior chest wall area This area was quite tender to gentle palpation  Musculoskeletal:       Quite uncomfortable when he stands or switches positions in the wheelchair          Assessment & Plan:   Probable fracture rib right posterior chest wall. Options were discussed with the daughter. Very little to be gained by an x-ray. We'll continue Vicodin for pain and attempt to minimize activities Cognitive impairment Chronic atrial fibrillation Chronic Coumadin anticoagulation  Nursing home forms completed New orders written  and forms  signed

## 2012-11-05 ENCOUNTER — Encounter: Payer: Self-pay | Admitting: Family

## 2012-11-23 ENCOUNTER — Telehealth: Payer: Self-pay | Admitting: Family

## 2012-11-23 ENCOUNTER — Ambulatory Visit: Payer: Self-pay | Admitting: Family

## 2012-11-23 DIAGNOSIS — I482 Chronic atrial fibrillation, unspecified: Secondary | ICD-10-CM

## 2012-11-23 NOTE — Telephone Encounter (Signed)
Pt's daughter says pt is having inr managed at [place]

## 2012-11-23 NOTE — Patient Instructions (Signed)
Patient is in an Assistive living facility who is managing his INR.

## 2012-11-23 NOTE — Telephone Encounter (Signed)
Please cal and schedule INR appointment.

## 2012-11-24 LAB — PROTIME-INR

## 2012-12-24 LAB — POCT INR: INR: 2.2

## 2012-12-29 ENCOUNTER — Ambulatory Visit (INDEPENDENT_AMBULATORY_CARE_PROVIDER_SITE_OTHER): Payer: Medicare Other | Admitting: Family

## 2012-12-29 DIAGNOSIS — I4891 Unspecified atrial fibrillation: Secondary | ICD-10-CM

## 2012-12-29 DIAGNOSIS — Z7901 Long term (current) use of anticoagulants: Secondary | ICD-10-CM

## 2012-12-29 DIAGNOSIS — I482 Chronic atrial fibrillation, unspecified: Secondary | ICD-10-CM

## 2012-12-29 LAB — POCT INR: INR: 2.2

## 2012-12-29 NOTE — Patient Instructions (Signed)
Continue same dosage. Recheck in 3 weeks.   Anticoagulation Dose Instructions as of 12/29/2012     Howard Payne Tue Wed Thu Fri Sat   New Dose 2.5 mg 2.5 mg 5 mg 2.5 mg 2.5 mg 2.5 mg 5 mg    Description       Continue same dosage. Recheck in 3 weeks.

## 2013-01-07 ENCOUNTER — Encounter: Payer: Self-pay | Admitting: Internal Medicine

## 2013-01-08 ENCOUNTER — Encounter: Payer: Self-pay | Admitting: Family Medicine

## 2013-01-08 ENCOUNTER — Ambulatory Visit (INDEPENDENT_AMBULATORY_CARE_PROVIDER_SITE_OTHER): Payer: Medicare Other | Admitting: Family Medicine

## 2013-01-08 ENCOUNTER — Telehealth: Payer: Self-pay

## 2013-01-08 VITALS — BP 110/70 | HR 105 | Temp 98.0°F | Wt 183.0 lb

## 2013-01-08 DIAGNOSIS — L989 Disorder of the skin and subcutaneous tissue, unspecified: Secondary | ICD-10-CM

## 2013-01-08 DIAGNOSIS — L0291 Cutaneous abscess, unspecified: Secondary | ICD-10-CM

## 2013-01-08 DIAGNOSIS — L0390 Cellulitis, unspecified: Secondary | ICD-10-CM

## 2013-01-08 DIAGNOSIS — I4891 Unspecified atrial fibrillation: Secondary | ICD-10-CM

## 2013-01-08 DIAGNOSIS — I482 Chronic atrial fibrillation, unspecified: Secondary | ICD-10-CM

## 2013-01-08 LAB — PROTIME-INR

## 2013-01-08 MED ORDER — DOXYCYCLINE HYCLATE 100 MG PO TABS: 100.0000 mg | ORAL_TABLET | Freq: Two times a day (BID) | ORAL | Status: DC

## 2013-01-08 MED ORDER — MUPIROCIN 2 % EX OINT: TOPICAL_OINTMENT | Freq: Two times a day (BID) | CUTANEOUS | Status: DC

## 2013-01-08 MED ORDER — MUPIROCIN 2 % EX OINT: TOPICAL_OINTMENT | Freq: Three times a day (TID) | CUTANEOUS | Status: DC

## 2013-01-08 NOTE — Telephone Encounter (Signed)
Received a call from Tonya at [facility] for pt's coumadin dosing.  Tonya received results.

## 2013-01-08 NOTE — Patient Instructions (Signed)
Continue current coumadin dose - 2.5 mg 5 days a week and 5 mg on Tuesdays and Sundays  Start the doxycycline 100mg  twice daily for 10 days  Apply warm compresses to back wound once daily and apply topical ointment twice daily  Follow up next week or if worsening over weekend go to urgent care or emergency care

## 2013-01-08 NOTE — Addendum Note (Signed)
Addended by: Terressa Koyanagi on: 01/08/2013 03:04 PM   Modules accepted: Orders

## 2013-01-08 NOTE — Progress Notes (Signed)
Chief Complaint  Patient presents with  . Recurrent Skin Infections    on lower back     HPI:  Acute visit for "? Infected Boil on Back": -started as a small pimple about 2 weeks ago -called his doctor and told to put compresses on it -got red and irritated last few days - not sure of treatments as another family member has been treating it at home -also has had a cough for a few days -no fevers, chills, nausea, vomiting, urinary or bowel changes - hx of dementia - ? worsening over last month or so  Coumadin Therapy: -on coumadin followed in coumadin clinic -family member reports told confusion at [facility] about what dose of coumadin he has been getting -we called [facility] and they report he has been getting 2.5mg  5 days a week and 5 mg on Tuesday and Sunday which is what is reported in coumadin clinic notes   ROS: See pertinent positives and negatives per HPI.  Past Medical History  Diagnosis Date  . Irregular heart rate   . Macular degeneration   . Cognitive impairment   . Hypotension   . 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 Glaucoma   . Atrial fibrillation     Family History  Problem Relation Age of Onset  . Skin cancer Sister     Non-melanoma  . Stroke Mother   . Stroke Father   . Diabetes Father     History   Social History  . Marital Status: Widowed    Spouse Name: N/A    Number of Children: N/A  . Years of Education: N/A   Social History Main Topics  . Smoking status: Never Smoker   . Smokeless tobacco: Never Used  . Alcohol Use: No  . Drug Use: No  . Sexually Active: None   Other Topics Concern  . None   Social History Narrative  . None    Current outpatient prescriptions:digoxin (LANOXIN) 0.25 MG tablet, Take 0.25 mg by mouth daily., Disp: , Rfl: ;  diphenhydrAMINE (SOMINEX) 25 MG tablet, Take 25 mg by mouth at bedtime as needed.  itching, Disp: , Rfl: ;  GENTAK 0.3 % ophthalmic ointment, Place 1 application into the left eye at bedtime. , Disp: , Rfl:  HYDROcodone-acetaminophen (NORCO/VICODIN) 5-325 MG per tablet, Take 1 tablet by mouth every 6 (six) hours as needed for pain., Disp: 60 tablet, Rfl: 0;  latanoprost (XALATAN) 0.005 % ophthalmic solution, Place 1 drop into both eyes at bedtime., Disp: , Rfl: ;  memantine (NAMENDA) 10 MG tablet, Take 10 mg by mouth 2 (two) times daily., Disp: , Rfl:  omeprazole (PRILOSEC) 20 MG capsule, Take 1 capsule (20 mg total) by mouth daily., Disp: 30 capsule, Rfl: 11;  Polyethyl Glycol-Propyl Glycol (SYSTANE OP), Apply 1 drop to eye daily as needed., Disp: , Rfl: ;  sertraline (ZOLOFT) 25 MG tablet, Take 1 tablet (25 mg total) by mouth daily., Disp: 30 tablet, Rfl: 11;  triamcinolone (KENALOG) 0.025 % cream, Apply 1 application topically daily as needed. itching, Disp: , Rfl:  warfarin (COUMADIN) 2.5 MG tablet, TAKE 1 TABLET BY MOUTH SUN,WED,FRI AND TAKE 1 AND 1/2 TABLET ON MON, TUES, THURS, SAT, Disp: 90 tablet, Rfl: 0;  warfarin (COUMADIN) 5 MG tablet, Take 5 mg by mouth daily. Take every Tuesday,and thursday, Disp: , Rfl: ;  zolpidem (  AMBIEN) 5 MG tablet, Take 5 mg by mouth at bedtime as needed. Foe sleep, Disp: , Rfl:   EXAM:  Filed Vitals:   01/08/13 1348  BP: 110/70  Pulse: 105  Temp: 98 F (36.7 C)    Body mass index is 23.19 kg/(m^2).  GENERAL: vitals reviewed and listed above, alert, oriented, appears well hydrated and in no acute distress  HEENT: atraumatic, conjunttiva clear, no obvious abnormalities on inspection of external nose and ears  NECK: no obvious masses on inspection  LUNGS: clear to auscultation bilaterally, no wheezes, rales or rhonchi, good air movement  CV: HRRR, no peripheral edema  SKIN: draining 1 cm mildly raise lesion LLB with surrounding erythema, no abscess to drain  MS: moves all extremities without noticeable abnormality  PSYCH: pleasant  and cooperative, no obvious depression or anxiety  ASSESSMENT AND PLAN:  Discussed the following assessment and plan:  Skin lesion of back - Plan: DISCONTINUED: doxycycline (VIBRA-TABS) 100 MG tablet  Chronic atrial fibrillation  Cellulitis - Plan: DISCONTINUED: mupirocin ointment (BACTROBAN) 2 %  -appear lesion is draining on its own - query inflamed cyst versus infected boil -wound cx obtained -discussed exploration of wound versus tx with abx and compresses - caregiver reports does not want exploration as patient bleed very easily and had trouble with extensive bleeding in the past when skin cancer removed -will tx with doxy, topical mupirocin, compresses and follow up next week - UCC/ED precuations if worsening. Discussed abx risks and interactions with coumadin.  -Pandonda notified of abx and pt concerns about dosing, INR checked an 1.5 - discussed with Padonda and will keep on current dose given interaction with coumadin and pt will follow up in coumadin clinic and with PCP next week. -Patient advised to return or notify a doctor immediately if symptoms worsen or persist or new concerns arise.  There are no Patient Instructions on file for this visit.   Kriste Basque R.

## 2013-01-11 ENCOUNTER — Telehealth: Payer: Self-pay | Admitting: Family Medicine

## 2013-01-11 NOTE — Telephone Encounter (Signed)
[phone] (home)   Called and discussed culture results with son. Son reports wound is looking and feeling better since starting anibiotic 2 days ago. Has follow up with PCP. Advised of MRSA and to be seen sooner if should worsen.

## 2013-01-12 ENCOUNTER — Telehealth: Payer: Self-pay | Admitting: Internal Medicine

## 2013-01-12 NOTE — Telephone Encounter (Signed)
Tonya at [facility] states they need a copy of Cloyce's recent lab results. Please fax to: [phone]

## 2013-01-13 ENCOUNTER — Encounter: Payer: Self-pay | Admitting: Internal Medicine

## 2013-01-13 ENCOUNTER — Ambulatory Visit (INDEPENDENT_AMBULATORY_CARE_PROVIDER_SITE_OTHER): Payer: Medicare Other | Admitting: Internal Medicine

## 2013-01-13 VITALS — BP 90/50 | HR 72 | Temp 98.4°F | Resp 22 | Wt 185.0 lb

## 2013-01-13 DIAGNOSIS — L0291 Cutaneous abscess, unspecified: Secondary | ICD-10-CM

## 2013-01-13 DIAGNOSIS — I4891 Unspecified atrial fibrillation: Secondary | ICD-10-CM

## 2013-01-13 DIAGNOSIS — I482 Chronic atrial fibrillation, unspecified: Secondary | ICD-10-CM

## 2013-01-13 DIAGNOSIS — F09 Unspecified mental disorder due to known physiological condition: Secondary | ICD-10-CM

## 2013-01-13 DIAGNOSIS — L0390 Cellulitis, unspecified: Secondary | ICD-10-CM

## 2013-01-13 DIAGNOSIS — R4189 Other symptoms and signs involving cognitive functions and awareness: Secondary | ICD-10-CM

## 2013-01-13 DIAGNOSIS — Z7901 Long term (current) use of anticoagulants: Secondary | ICD-10-CM

## 2013-01-13 LAB — POCT INR: INR: 2.5

## 2013-01-13 MED ORDER — WARFARIN SODIUM 2.5 MG PO TABS: 2.5000 mg | ORAL_TABLET | Freq: Every day | ORAL | Status: DC

## 2013-01-13 MED ORDER — WARFARIN SODIUM 2.5 MG PO TABS: ORAL_TABLET | ORAL | Status: DC

## 2013-01-13 NOTE — Progress Notes (Signed)
Subjective:    Patient ID: Howard Payne, male    DOB: 1920-10-28, 79 y.o.   MRN: [id number]  HPI 79 year old patient who is seen today in followup.  The patient was evaluated and treated last week for MRSA infection involving a furuncle Involving his left lower back. Culture and sensitivities confirmed MRSA sensitive to doxycycline. He is completing a 10 day course he feels much improved and his wound has also improved. He resides at an extended care facility and is receiving topical antibiotic ointment twice daily as well as antibiotic therapy. He has chronic atrial fibrillation and remains on chronic Coumadin anticoagulation INR has been quite labile and was a bit subtherapeutic last week. Prior to that reading he was slightly over anticoagulated on his last INR. Remains on Coumadin 5 mg twice weekly and 2.5 mg the other 5 days of the week. INR today 2.5 He has cognitive dysfunction and remains on Namenda.  Past Medical History  Diagnosis Date  . Irregular heart rate   . Macular degeneration   . Cognitive impairment   . Hypotension   . 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 Glaucoma   . Atrial fibrillation     History   Social History  . Marital Status: Widowed    Spouse Name: N/A    Number of Children: N/A  . Years of Education: N/A   Occupational History  . Not on file.   Social History Main Topics  . Smoking status: Never Smoker   . Smokeless tobacco: Never Used  . Alcohol Use: No  . Drug Use: No  . Sexually Active: Not on file   Other Topics Concern  . Not on file   Social History Narrative  . No narrative on file    Past Surgical History  Procedure Laterality Date  . Mohs surgery  2006, 07/06/12    Left Lower Eyelid: Invasive Squamous Cell Carcinoma  . Shave biopsy  05/14/12    Left Lower Forehead  . Skin punch biopsy  05/14/12    Mid Left Lower Eyelid     Family History  Problem Relation Age of Onset  . Skin cancer Sister     Non-melanoma  . Stroke Mother   . Stroke Father   . Diabetes Father     No Known Allergies  Current Outpatient Prescriptions on File Prior to Visit  Medication Sig Dispense Refill  . digoxin (LANOXIN) 0.25 MG tablet Take 0.25 mg by mouth daily.      . diphenhydrAMINE (SOMINEX) 25 MG tablet Take 25 mg by mouth at bedtime as needed. itching      . doxycycline (VIBRA-TABS) 100 MG tablet Take 1 tablet (100 mg total) by mouth 2 (two) times daily.  20 tablet  0  . GENTAK 0.3 % ophthalmic ointment Place 1 application into the left eye at bedtime.       Marland Kitchen HYDROcodone-acetaminophen (NORCO/VICODIN) 5-325 MG per tablet Take 1 tablet by mouth every 6 (six) hours as needed for pain.  60 tablet  0  . latanoprost (XALATAN) 0.005 % ophthalmic solution Place 1 drop into both eyes at bedtime.      . memantine (NAMENDA) 10 MG tablet Take 10 mg by mouth 2 (two) times daily.      . mupirocin ointment (BACTROBAN) 2 % Apply topically 2 (two) times daily.  22 g  0  .  omeprazole (PRILOSEC) 20 MG capsule Take 1 capsule (20 mg total) by mouth daily.  30 capsule  11  . Polyethyl Glycol-Propyl Glycol (SYSTANE OP) Apply 1 drop to eye daily as needed.      . sertraline (ZOLOFT) 25 MG tablet Take 1 tablet (25 mg total) by mouth daily.  30 tablet  11  . triamcinolone (KENALOG) 0.025 % cream Apply 1 application topically daily as needed. itching      . zolpidem (AMBIEN) 5 MG tablet Take 5 mg by mouth at bedtime as needed. Foe sleep       No current facility-administered medications on file prior to visit.    BP 90/50  Pulse 72  Temp(Src) 98.4 F (36.9 C) (Oral)  Resp 22  Wt 185 lb (83.915 kg)  BMI 23.44 kg/m2  SpO2 98%       Review of Systems  Constitutional: Negative for fever, chills, appetite change and fatigue.  HENT: Negative for hearing loss, ear pain, congestion, sore throat, trouble swallowing, neck stiffness, dental  problem, voice change and tinnitus.   Eyes: Negative for pain, discharge and visual disturbance.  Respiratory: Negative for cough, chest tightness, wheezing and stridor.   Cardiovascular: Negative for chest pain, palpitations and leg swelling.  Gastrointestinal: Negative for nausea, vomiting, abdominal pain, diarrhea, constipation, blood in stool and abdominal distention.  Genitourinary: Negative for urgency, hematuria, flank pain, discharge, difficulty urinating and genital sores.  Musculoskeletal: Positive for back pain, arthralgias and gait problem. Negative for myalgias and joint swelling.  Skin: Positive for wound. Negative for rash.  Neurological: Negative for dizziness, syncope, speech difficulty, weakness, numbness and headaches.  Hematological: Negative for adenopathy. Does not bruise/bleed easily.  Psychiatric/Behavioral: Positive for confusion. Negative for behavioral problems and dysphoric mood. The patient is not nervous/anxious.        Objective:   Physical Exam  Constitutional: He is oriented to person, place, and time. He appears well-developed.  HENT:  Head: Normocephalic.  Right Ear: External ear normal.  Left Ear: External ear normal.  Eyes: Conjunctivae and EOM are normal.  Neck: Normal range of motion.  Cardiovascular: Normal rate and normal heart sounds.   Pulmonary/Chest: Breath sounds normal.  Abdominal: Bowel sounds are normal.  Musculoskeletal: Normal range of motion. He exhibits no edema and no tenderness.  Neurological: He is alert and oriented to person, place, and time.  Skin:  Resolving small abscess left lower back a small superficial ulcer approximately 6 mm in diameter with approximately 4 cm of surrounding erythema noted there is no drainage or fluctuance  Psychiatric: He has a normal mood and affect. His behavior is normal.          Assessment & Plan:   MRSA skin and soft tissue infection with resolving cellulitis. Continue  doxycycline Chronic Coumadin anticoagulation. INR 2.5 Will watch closely on antibiotic therapy. Continue present regimen Chronic atrial fibrillation Cognitive impairment  Medications updated Nursing home forms and orders reviewed and signed  Return in 6 months or as needed

## 2013-01-13 NOTE — Patient Instructions (Addendum)
Take your antibiotic as prescribed until ALL of it is gone, but stop if you develop a rash, swelling, or any side effects of the medication.  Contact our office as soon as possible if  there are side effects of the medication.  Return in 6 months for follow-up 

## 2013-02-09 ENCOUNTER — Telehealth: Payer: Self-pay | Admitting: Family

## 2013-02-09 NOTE — Telephone Encounter (Signed)
Patient needs to be scheduled for an INR recheck.

## 2013-02-10 LAB — POCT INR: INR: 2.6

## 2013-02-10 NOTE — Telephone Encounter (Signed)
Courtney at [facility] states that pt/inr was done today and she is waiting for the results to be faxed from the lab so that she can fax them here

## 2013-02-15 ENCOUNTER — Telehealth: Payer: Self-pay | Admitting: *Deleted

## 2013-02-15 NOTE — Telephone Encounter (Signed)
Received message on my voicemail from pt's daughter [name] and had questions regarding x-ray and CT scan that showed COPD saw it on MY Chart and it had never been addressed to them and would like to know if they need to do anything? Please advise

## 2013-02-15 NOTE — Telephone Encounter (Signed)
Incidental finding-will discuss at next rov- no treatment

## 2013-02-16 NOTE — Telephone Encounter (Signed)
Called Howard Payne back pt's daughter and told her Incidental finding will discuss at next office visit, no treatment per Dr. Amador Cunas. Howard Payne verbalized understanding.

## 2013-02-22 ENCOUNTER — Encounter: Payer: Self-pay | Admitting: Internal Medicine

## 2013-03-09 ENCOUNTER — Telehealth: Payer: Self-pay | Admitting: Family

## 2013-03-09 ENCOUNTER — Ambulatory Visit (INDEPENDENT_AMBULATORY_CARE_PROVIDER_SITE_OTHER): Payer: Medicare Other | Admitting: Family

## 2013-03-09 DIAGNOSIS — I482 Chronic atrial fibrillation, unspecified: Secondary | ICD-10-CM

## 2013-03-09 DIAGNOSIS — I4891 Unspecified atrial fibrillation: Secondary | ICD-10-CM

## 2013-03-09 DIAGNOSIS — Z7901 Long term (current) use of anticoagulants: Secondary | ICD-10-CM

## 2013-03-09 NOTE — Patient Instructions (Signed)
Continue same dosage. Recheck in 4 weeks.   Anticoagulation Dose Instructions as of 03/09/2013     Howard Payne Tue Wed Thu Fri Sat   New Dose 2.5 mg 2.5 mg 5 mg 2.5 mg 2.5 mg 2.5 mg 5 mg    Description       Continue same dosage. Recheck in 4 weeks.

## 2013-03-09 NOTE — Telephone Encounter (Signed)
We still do not have INR results on Howard Payne. Please call and have faxed.

## 2013-03-09 NOTE — Telephone Encounter (Signed)
INR was dine on 02/10/13 and is due on 03/11/13, per [facility] and results scanned into chart

## 2013-03-11 LAB — PROTIME-INR: INR: 2.1 — AB (ref 0.9–1.1)

## 2013-03-11 LAB — POCT INR: INR: 2

## 2013-03-31 ENCOUNTER — Encounter: Payer: Self-pay | Admitting: Internal Medicine

## 2013-04-02 ENCOUNTER — Telehealth: Payer: Self-pay | Admitting: Family

## 2013-04-02 ENCOUNTER — Ambulatory Visit (INDEPENDENT_AMBULATORY_CARE_PROVIDER_SITE_OTHER): Payer: Medicare Other | Admitting: Family

## 2013-04-02 DIAGNOSIS — I4891 Unspecified atrial fibrillation: Secondary | ICD-10-CM

## 2013-04-02 DIAGNOSIS — I482 Chronic atrial fibrillation, unspecified: Secondary | ICD-10-CM

## 2013-04-02 DIAGNOSIS — Z7901 Long term (current) use of anticoagulants: Secondary | ICD-10-CM

## 2013-04-02 NOTE — Telephone Encounter (Signed)
INR was done on 03/11/13 and scanned into pt's chart.. It was never received by [facility] or myself. Results printed and reviewed today. I will contact [facility] with instruction and recheck date

## 2013-04-02 NOTE — Patient Instructions (Signed)
Continue same dosage. Recheck in 4 weeks.   Anticoagulation Dose Instructions as of 04/02/2013     Glynis Smiles Tue Wed Thu Fri Sat   New Dose 2.5 mg 2.5 mg 5 mg 2.5 mg 2.5 mg 2.5 mg 5 mg    Description       Continue same dosage. Recheck in 4 weeks.

## 2013-04-02 NOTE — Telephone Encounter (Signed)
Needs INR visit asap!! 

## 2013-04-08 LAB — POCT INR
INR: 2.5
INR: 2.5
INR: 2.5

## 2013-04-15 ENCOUNTER — Ambulatory Visit (INDEPENDENT_AMBULATORY_CARE_PROVIDER_SITE_OTHER)
Admission: RE | Admit: 2013-04-15 | Discharge: 2013-04-15 | Disposition: A | Discharge status: Home / Self Care | Payer: Medicare Other | Source: Ambulatory Visit | Attending: Internal Medicine | Admitting: Internal Medicine

## 2013-04-15 ENCOUNTER — Ambulatory Visit (INDEPENDENT_AMBULATORY_CARE_PROVIDER_SITE_OTHER): Payer: Medicare Other | Admitting: Internal Medicine

## 2013-04-15 ENCOUNTER — Encounter: Payer: Self-pay | Admitting: Internal Medicine

## 2013-04-15 VITALS — BP 104/60 | HR 80 | Temp 98.5°F | Resp 20 | Wt 186.0 lb

## 2013-04-15 DIAGNOSIS — R4189 Other symptoms and signs involving cognitive functions and awareness: Secondary | ICD-10-CM

## 2013-04-15 DIAGNOSIS — I482 Chronic atrial fibrillation, unspecified: Secondary | ICD-10-CM

## 2013-04-15 DIAGNOSIS — R079 Chest pain, unspecified: Secondary | ICD-10-CM

## 2013-04-15 DIAGNOSIS — F29 Unspecified psychosis not due to a substance or known physiological condition: Secondary | ICD-10-CM

## 2013-04-15 DIAGNOSIS — I4891 Unspecified atrial fibrillation: Secondary | ICD-10-CM

## 2013-04-15 DIAGNOSIS — R410 Disorientation, unspecified: Secondary | ICD-10-CM

## 2013-04-15 DIAGNOSIS — Z7901 Long term (current) use of anticoagulants: Secondary | ICD-10-CM

## 2013-04-15 DIAGNOSIS — F09 Unspecified mental disorder due to known physiological condition: Secondary | ICD-10-CM

## 2013-04-15 LAB — CBC WITH DIFFERENTIAL/PLATELET
Eosinophils Relative: 4 % (ref 0.0–5.0)
HCT: 42 % (ref 39.0–52.0)
Hemoglobin: 14.4 g/dL (ref 13.0–17.0)
Lymphs Abs: 1.5 10*3/uL (ref 0.7–4.0)
Monocytes Relative: 13.1 % — ABNORMAL HIGH (ref 3.0–12.0)
Platelets: 251 10*3/uL (ref 150.0–400.0)
WBC: 8.8 10*3/uL (ref 4.5–10.5)

## 2013-04-15 LAB — POCT URINALYSIS DIPSTICK
Protein, UA: 30
Spec Grav, UA: 1.025
Urobilinogen, UA: 2
pH, UA: 6

## 2013-04-15 LAB — COMPREHENSIVE METABOLIC PANEL
CO2: 27 mEq/L (ref 19–32)
Creatinine, Ser: 1 mg/dL (ref 0.4–1.5)
GFR: 78.09 mL/min (ref >60.00)
Glucose, Bld: 81 mg/dL (ref 70–99)
Total Bilirubin: 1.5 mg/dL — ABNORMAL HIGH (ref 0.3–1.2)
Total Protein: 7.2 g/dL (ref 6.0–8.3)

## 2013-04-15 LAB — POCT INR: INR: 2.8

## 2013-04-15 NOTE — Progress Notes (Signed)
Subjective:    Patient ID: Howard Payne, male    DOB: 09/15/1920, 79 y.o.   MRN: [id number]  HPI  79 year old patient who is seen today for followup;  he is accompanied by a son.  He has been having either some right chest wall or possibly right upper quadrant discomfort. Due to his cognitive impairment history is challenging. Today he complains of no pain. He does have a history of chronic atrial fibrillation and is on Coumadin anticoagulation. Is scheduled for INR today. Nursing home records were reviewed. He has a history of glaucoma.  Past Medical History  Diagnosis Date  . Irregular heart rate   . Macular degeneration   . Cognitive impairment   . Hypotension   . 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 Glaucoma   . Atrial fibrillation     History   Social History  . Marital Status: Widowed    Spouse Name: N/A    Number of Children: N/A  . Years of Education: N/A   Occupational History  . Not on file.   Social History Main Topics  . Smoking status: Never Smoker   . Smokeless tobacco: Never Used  . Alcohol Use: No  . Drug Use: No  . Sexually Active: Not on file   Other Topics Concern  . Not on file   Social History Narrative  . No narrative on file    Past Surgical History  Procedure Laterality Date  . Mohs surgery  2006, 07/06/12    Left Lower Eyelid: Invasive Squamous Cell Carcinoma  . Shave biopsy  05/14/12    Left Lower Forehead  . Skin punch biopsy  05/14/12    Mid Left Lower Eyelid    Family History  Problem Relation Age of Onset  . Skin cancer Sister     Non-melanoma  . Stroke Mother   . Stroke Father   . Diabetes Father     No Known Allergies  Current Outpatient Prescriptions on File Prior to Visit  Medication Sig Dispense Refill  . digoxin (LANOXIN) 0.25 MG tablet Take 0.25 mg by mouth daily.      . diphenhydrAMINE (SOMINEX) 25 MG tablet  Take 25 mg by mouth at bedtime as needed. itching      . doxycycline (VIBRA-TABS) 100 MG tablet Take 1 tablet (100 mg total) by mouth 2 (two) times daily.  20 tablet  0  . GENTAK 0.3 % ophthalmic ointment Place 1 application into the left eye at bedtime.       Marland Kitchen HYDROcodone-acetaminophen (NORCO/VICODIN) 5-325 MG per tablet Take 1 tablet by mouth every 6 (six) hours as needed for pain.  60 tablet  0  . latanoprost (XALATAN) 0.005 % ophthalmic solution Place 1 drop into both eyes at bedtime.      . memantine (NAMENDA) 10 MG tablet Take 10 mg by mouth 2 (two) times daily.      . mupirocin ointment (BACTROBAN) 2 % Apply topically 2 (two) times daily.  22 g  0  . Polyethyl Glycol-Propyl Glycol (SYSTANE OP) Apply 1 drop to eye daily as needed.      . sertraline (ZOLOFT) 25 MG tablet Take 1 tablet (25 mg total) by mouth daily.  30 tablet  11  . warfarin (COUMADIN) 2.5 MG tablet 2.5 mg 5 times per week (5 mg each Tuesday and Sunday)  90 tablet  0  . omeprazole (PRILOSEC) 20 MG capsule Take 1 capsule (20 mg total) by mouth daily.  30 capsule  11  . zolpidem (AMBIEN) 5 MG tablet Take 5 mg by mouth at bedtime as needed. Foe sleep       No current facility-administered medications on file prior to visit.    BP 104/60  Pulse 80  Temp(Src) 98.5 F (36.9 C) (Oral)  Resp 20  Wt 186 lb (84.369 kg)  BMI 23.57 kg/m2  SpO2 94%       Review of Systems  Constitutional: Negative for fever, chills, appetite change and fatigue.  HENT: Negative for hearing loss, ear pain, congestion, sore throat, trouble swallowing, neck stiffness, dental problem, voice change and tinnitus.   Eyes: Negative for pain, discharge and visual disturbance.  Respiratory: Negative for cough, chest tightness, wheezing and stridor.   Cardiovascular: Positive for chest pain and leg swelling. Negative for palpitations.  Gastrointestinal: Negative for nausea, vomiting, abdominal pain, diarrhea, constipation, blood in stool and  abdominal distention.  Genitourinary: Negative for urgency, hematuria, flank pain, discharge, difficulty urinating and genital sores.  Musculoskeletal: Positive for gait problem. Negative for myalgias, back pain, joint swelling and arthralgias.  Skin: Negative for rash.  Neurological: Negative for dizziness, syncope, speech difficulty, weakness, numbness and headaches.  Hematological: Negative for adenopathy. Does not bruise/bleed easily.  Psychiatric/Behavioral: Positive for confusion. Negative for behavioral problems and dysphoric mood. The patient is not nervous/anxious.        Objective:   Physical Exam  Constitutional: He is oriented to person, place, and time. He appears well-developed.  HENT:  Head: Normocephalic.  Right Ear: External ear normal.  Left Ear: External ear normal.  Eyes: Conjunctivae and EOM are normal.  Neck: Normal range of motion.  Cardiovascular: Normal rate and normal heart sounds.   Pulmonary/Chest: Breath sounds normal.  Abdominal: Soft. Bowel sounds are normal. He exhibits no distension. There is no tenderness. There is no rebound and no guarding.  Musculoskeletal: Normal range of motion. He exhibits no edema and no tenderness.  Neurological: He is alert and oriented to person, place, and time.  Psychiatric: He has a normal mood and affect. His behavior is normal.  Cognitive impairment          Assessment & Plan:   Right-sided chest pain. Normal clinical examination. Family is very concerned and is requesting a chest x-ray. Will review Cognitive impairment stable Chronic anticoagulation. We'll check an INR Chronic atrial fibrillation Hematuria in the setting of chronic Coumadin anticoagulation (INR therapeutic) unclear significance.  Will recheck next visit; we'll consider urological evaluation if there is any gross hematuria  Recheck 6 months or as needed Laboratory update will be reviewed

## 2013-04-15 NOTE — Patient Instructions (Signed)
Call or return to clinic prn if these symptoms worsen or fail to improve as anticipated.

## 2013-05-04 ENCOUNTER — Telehealth: Payer: Self-pay | Admitting: Family

## 2013-05-04 ENCOUNTER — Ambulatory Visit (INDEPENDENT_AMBULATORY_CARE_PROVIDER_SITE_OTHER): Payer: Medicare Other | Admitting: Family

## 2013-05-04 DIAGNOSIS — I482 Chronic atrial fibrillation, unspecified: Secondary | ICD-10-CM

## 2013-05-04 DIAGNOSIS — Z7901 Long term (current) use of anticoagulants: Secondary | ICD-10-CM

## 2013-05-04 DIAGNOSIS — I4891 Unspecified atrial fibrillation: Secondary | ICD-10-CM

## 2013-05-04 NOTE — Telephone Encounter (Signed)
Pt/inr was done 04/08/13 and signed off by PCP. Recheck 4 weeks after 04/09/13

## 2013-05-04 NOTE — Telephone Encounter (Signed)
Needs INR checked asap!! 

## 2013-05-04 NOTE — Patient Instructions (Signed)
Continue same dosage~ Recheck in 4 weeks

## 2013-05-07 ENCOUNTER — Ambulatory Visit (INDEPENDENT_AMBULATORY_CARE_PROVIDER_SITE_OTHER): Payer: Medicare Other | Admitting: Family

## 2013-05-07 DIAGNOSIS — I4891 Unspecified atrial fibrillation: Secondary | ICD-10-CM

## 2013-05-07 DIAGNOSIS — Z7901 Long term (current) use of anticoagulants: Secondary | ICD-10-CM

## 2013-05-07 DIAGNOSIS — I482 Chronic atrial fibrillation, unspecified: Secondary | ICD-10-CM

## 2013-05-07 LAB — POCT INR: INR: 2.7

## 2013-05-07 NOTE — Patient Instructions (Signed)
Continue same dosage~ Recheck in 4 weeks

## 2013-06-23 ENCOUNTER — Other Ambulatory Visit: Payer: Self-pay

## 2013-06-28 ENCOUNTER — Ambulatory Visit: Payer: Medicare Other | Admitting: Internal Medicine

## 2013-07-01 ENCOUNTER — Ambulatory Visit (INDEPENDENT_AMBULATORY_CARE_PROVIDER_SITE_OTHER): Payer: Medicare Other | Admitting: Internal Medicine

## 2013-07-01 ENCOUNTER — Encounter: Payer: Self-pay | Admitting: Internal Medicine

## 2013-07-01 VITALS — BP 100/60 | HR 76 | Temp 98.4°F | Resp 20 | Wt 192.0 lb

## 2013-07-01 DIAGNOSIS — I482 Chronic atrial fibrillation, unspecified: Secondary | ICD-10-CM

## 2013-07-01 DIAGNOSIS — I4891 Unspecified atrial fibrillation: Secondary | ICD-10-CM

## 2013-07-01 DIAGNOSIS — Z7901 Long term (current) use of anticoagulants: Secondary | ICD-10-CM

## 2013-07-01 DIAGNOSIS — R4189 Other symptoms and signs involving cognitive functions and awareness: Secondary | ICD-10-CM

## 2013-07-01 DIAGNOSIS — F09 Unspecified mental disorder due to known physiological condition: Secondary | ICD-10-CM

## 2013-07-01 LAB — PROTIME-INR: INR: 2 — AB (ref 0.9–1.1)

## 2013-07-01 NOTE — Patient Instructions (Signed)
Take 650-mg  of Tylenol every 6 hours as needed for pain relief or fever.  Avoid taking more than 3000 mg in a 24-hour period (  This may cause liver damage).

## 2013-07-01 NOTE — Progress Notes (Signed)
Subjective:    Patient ID: Howard Payne, male    DOB: 05-10-1920, 79 y.o.   MRN: [id number]  HPI    79 year old patient who is seen today for followup. [facility] is a resident of [facility]. He has a history of chronic atrial fibrillation and remains on chronic Coumadin anticoagulation. He has mild senile dementia. He does quite well for 79 years of age Concerns include knee pain. He's had recent surgery for skin cancer involving the right arm and left the facial area above the left eyebrow  Past Medical History  Diagnosis Date  . Irregular heart rate   . Macular degeneration   . Cognitive impairment   . Hypotension   . 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 Glaucoma   . Atrial fibrillation     History   Social History  . Marital Status: Widowed    Spouse Name: N/A    Number of Children: N/A  . Years of Education: N/A   Occupational History  . Not on file.   Social History Main Topics  . Smoking status: Never Smoker   . Smokeless tobacco: Never Used  . Alcohol Use: No  . Drug Use: No  . Sexual Activity: Not on file   Other Topics Concern  . Not on file   Social History Narrative  . No narrative on file    Past Surgical History  Procedure Laterality Date  . Mohs surgery  2006, 07/06/12    Left Lower Eyelid: Invasive Squamous Cell Carcinoma  . Shave biopsy  05/14/12    Left Lower Forehead  . Skin punch biopsy  05/14/12    Mid Left Lower Eyelid    Family History  Problem Relation Age of Onset  . Skin cancer Sister     Non-melanoma  . Stroke Mother   . Stroke Father   . Diabetes Father     No Known Allergies  Current Outpatient Prescriptions on File Prior to Visit  Medication Sig Dispense Refill  . digoxin (LANOXIN) 0.25 MG tablet Take 0.25 mg by mouth daily.      . diphenhydrAMINE (SOMINEX) 25 MG tablet Take 25 mg by mouth at bedtime as needed. itching       . doxycycline (VIBRA-TABS) 100 MG tablet Take 1 tablet (100 mg total) by mouth 2 (two) times daily.  20 tablet  0  . HYDROcodone-acetaminophen (NORCO/VICODIN) 5-325 MG per tablet Take 1 tablet by mouth every 6 (six) hours as needed for pain.  60 tablet  0  . latanoprost (XALATAN) 0.005 % ophthalmic solution Place 1 drop into both eyes at bedtime.      . memantine (NAMENDA) 10 MG tablet Take 10 mg by mouth 2 (two) times daily.      . mupirocin ointment (BACTROBAN) 2 % Apply topically 2 (two) times daily.  22 g  0  . Polyethyl Glycol-Propyl Glycol (SYSTANE OP) Apply 1 drop to eye daily as needed.      . sertraline (ZOLOFT) 25 MG tablet Take 1 tablet (25 mg total) by mouth daily.  30 tablet  11  . warfarin (COUMADIN) 2.5 MG tablet 2.5 mg 5 times per week (5 mg each Tuesday and Sunday)  90 tablet  0   No current facility-administered medications on file prior to visit.    BP 100/60  Pulse 76  Temp(Src) 98.4 F (36.9 C) (  Oral)  Resp 20  Wt 192 lb (87.091 kg)  BMI 24.33 kg/m2  SpO2 97%     Review of Systems  Constitutional: Negative for fever, chills, appetite change and fatigue.  HENT: Negative for hearing loss, ear pain, congestion, sore throat, trouble swallowing, neck stiffness, dental problem, voice change and tinnitus.   Eyes: Negative for pain, discharge and visual disturbance.  Respiratory: Negative for cough, chest tightness, wheezing and stridor.   Cardiovascular: Negative for chest pain, palpitations and leg swelling.  Gastrointestinal: Negative for nausea, vomiting, abdominal pain, diarrhea, constipation, blood in stool and abdominal distention.  Genitourinary: Negative for urgency, hematuria, flank pain, discharge, difficulty urinating and genital sores.  Musculoskeletal: Positive for back pain, arthralgias and gait problem. Negative for myalgias and joint swelling.  Skin: Positive for wound. Negative for rash.  Neurological: Negative for dizziness, syncope, speech  difficulty, weakness, numbness and headaches.  Hematological: Negative for adenopathy. Does not bruise/bleed easily.  Psychiatric/Behavioral: Negative for behavioral problems and dysphoric mood. The patient is not nervous/anxious.        Objective:   Physical Exam  Constitutional: He is oriented to person, place, and time. He appears well-developed.  HENT:  Head: Normocephalic.  Right Ear: External ear normal.  Left Ear: External ear normal.  Eyes: Conjunctivae and EOM are normal.  Neck: Normal range of motion.  Cardiovascular: Normal rate and normal heart sounds.   Controlled ventricular response  Pulmonary/Chest: Breath sounds normal.  Abdominal: Bowel sounds are normal.  Musculoskeletal: Normal range of motion. He exhibits no edema and no tenderness.  Neurological: He is alert and oriented to person, place, and time.  Skin:  Slight redness and induration about an incision involving his right upper arm  Psychiatric: He has a normal mood and affect. His behavior is normal.          Assessment & Plan:   CAF  Chronc anticoagulatuion Dementia

## 2013-07-02 ENCOUNTER — Ambulatory Visit (INDEPENDENT_AMBULATORY_CARE_PROVIDER_SITE_OTHER): Payer: Medicare Other | Admitting: Family

## 2013-07-02 ENCOUNTER — Telehealth: Payer: Self-pay | Admitting: Internal Medicine

## 2013-07-02 DIAGNOSIS — Z7901 Long term (current) use of anticoagulants: Secondary | ICD-10-CM

## 2013-07-02 DIAGNOSIS — I482 Chronic atrial fibrillation, unspecified: Secondary | ICD-10-CM

## 2013-07-02 DIAGNOSIS — I4891 Unspecified atrial fibrillation: Secondary | ICD-10-CM

## 2013-07-02 MED ORDER — CEPHALEXIN 500 MG PO CAPS: 500.0000 mg | ORAL_CAPSULE | Freq: Three times a day (TID) | ORAL | Status: DC

## 2013-07-02 NOTE — Telephone Encounter (Signed)
PT daughter called to elaborate on the "red area" that was found on the pt's arm. She states that this area is where he has previously had skin cancer removed. She wanted to let you know that as of this past Saturday, there was absolutely no redness or discolor to the area. The area obtained this coloring between Sunday, and yesterday at the time of his appt. FYI. Please assist.

## 2013-07-02 NOTE — Telephone Encounter (Signed)
Cephalexin 500 #30 one 3 times a day

## 2013-07-02 NOTE — Patient Instructions (Signed)
Continue same dosage. Recheck in 6 weeks.   Anticoagulation Dose Instructions as of 07/02/2013     Howard Payne Tue Wed Thu Fri Sat   New Dose 2.5 mg 2.5 mg 5 mg 2.5 mg 2.5 mg 2.5 mg 5 mg    Description       Continue same dosage. Recheck in 6 weeks.

## 2013-07-02 NOTE — Telephone Encounter (Signed)
Left detailed message for [name] pt's daughter that Rx for Cephalexin 500 mg was sent to pharmacy.

## 2013-07-03 NOTE — Telephone Encounter (Signed)
Left detailed message for Howard Payne pt's daughter checking to see whether she got my message about antibiotic.

## 2013-07-05 ENCOUNTER — Telehealth: Payer: Self-pay | Admitting: Internal Medicine

## 2013-07-05 NOTE — Telephone Encounter (Signed)
[facility] Triage Call Report Triage Record Num: [id number] Operator: Griselda Miner Patient Name: Howard Payne Call Date & Time: 07/03/2013 11:51:03AM Patient Phone: [phone] PCP: Gordy Savers Patient Gender: Male PCP Fax : [phone] Patient DOB: [birth year] [facility] Name[facility] Reason for Call: Caller: Alethea/Med Tech; PCP: Eleonore Chiquito (Family Practice > 29yrs old); CB#: [phone]; Call regarding Needs order given to pharmacy for Careflex 500mg  faxed to facility at [phone] (pt has medication, just needs order faxed to them as well); RN verified in Epic as well as at the [facility] ([phone] that the actual order is for Keflex 500mg , take 1 PO TID, Dispense 30 with no refills, (10 day course). Dispensing instructions faxed to facilty as requested. Protocol(s) Used: Office Note Recommended Outcome per Protocol: Information Noted and Sent to Office Reason for Outcome: Caller information to office Care Advice: ~ 08/

## 2013-07-15 ENCOUNTER — Encounter: Payer: Self-pay | Admitting: Internal Medicine

## 2013-07-21 ENCOUNTER — Telehealth: Payer: Self-pay | Admitting: Internal Medicine

## 2013-07-21 NOTE — Telephone Encounter (Signed)
Spoke to Sublimity pt's daughter told her I have a form but it is for the patient himself for handicapped permit. Asked [name] if pt is driving? She stated no. Told her to call the DMV and see what form needs to be filled out so family members can have handicapped permit and if the form I have is okay just let me know. [name] verbalized understanding.

## 2013-07-21 NOTE — Telephone Encounter (Signed)
PT daughter is calling to inquire about a perminate handicapped sticker. She stated that he is very unstable on his feet and it would be much easier to park and walk with him, rather than drop him off at the front door. Please assist.

## 2013-07-26 ENCOUNTER — Telehealth: Payer: Self-pay | Admitting: Internal Medicine

## 2013-07-26 NOTE — Telephone Encounter (Signed)
Salsalate  750 mg twice daily for 2 weeks Continue Vicodin every 6 hours as needed for pain

## 2013-07-26 NOTE — Telephone Encounter (Signed)
Pt's right leg is beginning to bother him again. Leg is hurting worse and hard to get out of his chair. Daughter states she was to contact you if it got worse. Not sure what direction to go at this point. Does he need xray?  Physical therapy? Pain is not being managed it the leg. pls advise.

## 2013-07-27 MED ORDER — SALSALATE 750 MG PO TABS: 750.0000 mg | ORAL_TABLET | Freq: Two times a day (BID) | ORAL | Status: DC

## 2013-07-27 MED ORDER — HYDROCODONE-ACETAMINOPHEN 5-325 MG PO TABS: 1.0000 | ORAL_TABLET | Freq: Four times a day (QID) | ORAL | Status: DC | PRN

## 2013-07-27 NOTE — Telephone Encounter (Signed)
Called [facility] and spoke to Alethea told her order for UA C&S was sent yesterday. Alethea said she got it. Told her for pt's knee pain new order for Salsalate 750 mg po BID x 2 weeks and continue Vicodin and I will fax orders to pharmacy. Alethea verbalized understanding. Medications faxed to pharmacy at [phone].

## 2013-07-29 ENCOUNTER — Telehealth: Payer: Self-pay | Admitting: *Deleted

## 2013-07-29 NOTE — Telephone Encounter (Signed)
Called pt's daughter [name] asked her how pt's knee was doing and if pain med helping? [name] said his knee is doing better and the swelling has gone down and the medication is helping. Told her okay if knee continue to bother him please let us know may need referral to Ortho. [name] verbalized understanding.

## 2013-08-04 ENCOUNTER — Telehealth: Payer: Self-pay | Admitting: *Deleted

## 2013-08-04 DIAGNOSIS — M25561 Pain in right knee: Secondary | ICD-10-CM

## 2013-08-04 NOTE — Telephone Encounter (Signed)
Spoke to pt's daughter [name] told her order done for referral to Ortho for knee pain for pt and someone will be contacting her regarding an appt. [name] verbalized understanding.

## 2013-08-09 ENCOUNTER — Encounter (HOSPITAL_COMMUNITY): Payer: Self-pay

## 2013-08-09 ENCOUNTER — Telehealth: Payer: Self-pay | Admitting: Internal Medicine

## 2013-08-09 ENCOUNTER — Emergency Department (HOSPITAL_COMMUNITY)
Admission: EM | Admit: 2013-08-09 | Discharge: 2013-08-09 | Disposition: A | Discharge status: Home / Self Care | Payer: Medicare Other | Attending: Emergency Medicine | Admitting: Emergency Medicine

## 2013-08-09 ENCOUNTER — Emergency Department (HOSPITAL_COMMUNITY): Payer: Medicare Other

## 2013-08-09 DIAGNOSIS — Z85828 Personal history of other malignant neoplasm of skin: Secondary | ICD-10-CM | POA: Insufficient documentation

## 2013-08-09 DIAGNOSIS — F0390 Unspecified dementia without behavioral disturbance: Secondary | ICD-10-CM | POA: Insufficient documentation

## 2013-08-09 DIAGNOSIS — Z9181 History of falling: Secondary | ICD-10-CM | POA: Insufficient documentation

## 2013-08-09 DIAGNOSIS — S2239XA Fracture of one rib, unspecified side, initial encounter for closed fracture: Secondary | ICD-10-CM | POA: Insufficient documentation

## 2013-08-09 DIAGNOSIS — R791 Abnormal coagulation profile: Secondary | ICD-10-CM

## 2013-08-09 DIAGNOSIS — Y929 Unspecified place or not applicable: Secondary | ICD-10-CM | POA: Insufficient documentation

## 2013-08-09 DIAGNOSIS — W19XXXA Unspecified fall, initial encounter: Secondary | ICD-10-CM | POA: Insufficient documentation

## 2013-08-09 DIAGNOSIS — H409 Unspecified glaucoma: Secondary | ICD-10-CM | POA: Insufficient documentation

## 2013-08-09 DIAGNOSIS — S2231XA Fracture of one rib, right side, initial encounter for closed fracture: Secondary | ICD-10-CM

## 2013-08-09 DIAGNOSIS — Z872 Personal history of diseases of the skin and subcutaneous tissue: Secondary | ICD-10-CM | POA: Insufficient documentation

## 2013-08-09 DIAGNOSIS — Z8669 Personal history of other diseases of the nervous system and sense organs: Secondary | ICD-10-CM | POA: Insufficient documentation

## 2013-08-09 DIAGNOSIS — I4891 Unspecified atrial fibrillation: Secondary | ICD-10-CM | POA: Insufficient documentation

## 2013-08-09 DIAGNOSIS — Y939 Activity, unspecified: Secondary | ICD-10-CM | POA: Insufficient documentation

## 2013-08-09 DIAGNOSIS — Z79899 Other long term (current) drug therapy: Secondary | ICD-10-CM | POA: Insufficient documentation

## 2013-08-09 DIAGNOSIS — Z7901 Long term (current) use of anticoagulants: Secondary | ICD-10-CM | POA: Insufficient documentation

## 2013-08-09 LAB — CBC WITH DIFFERENTIAL/PLATELET
Basophils Relative: 0 % (ref 0–1)
Eosinophils Relative: 2 % (ref 0–5)
HCT: 41 % (ref 39.0–52.0)
Hemoglobin: 14.5 g/dL (ref 13.0–17.0)
Lymphocytes Relative: 13 % (ref 12–46)
Lymphs Abs: 1.4 10*3/uL (ref 0.7–4.0)
MCV: 93.2 fL (ref 78.0–100.0)
Monocytes Absolute: 1.3 10*3/uL — ABNORMAL HIGH (ref 0.1–1.0)
Monocytes Relative: 12 % (ref 3–12)
Platelets: 246 10*3/uL (ref 150–400)
RBC: 4.4 MIL/uL (ref 4.22–5.81)
WBC: 10.6 10*3/uL — ABNORMAL HIGH (ref 4.0–10.5)

## 2013-08-09 LAB — BASIC METABOLIC PANEL
BUN: 17 mg/dL (ref 6–23)
CO2: 24 mEq/L (ref 19–32)
Calcium: 8.8 mg/dL (ref 8.4–10.5)
Creatinine, Ser: 0.79 mg/dL (ref 0.50–1.35)
GFR calc Af Amer: 89 mL/min — ABNORMAL LOW (ref >90)
Glucose, Bld: 110 mg/dL — ABNORMAL HIGH (ref 70–99)
Sodium: 134 mEq/L — ABNORMAL LOW (ref 135–145)

## 2013-08-09 LAB — PROTIME-INR: INR: 3.28 — ABNORMAL HIGH (ref 0.00–1.49)

## 2013-08-09 MED ORDER — HYDROCODONE-ACETAMINOPHEN 5-325 MG PO TABS: 2.0000 | ORAL_TABLET | ORAL | Status: DC | PRN

## 2013-08-09 NOTE — Telephone Encounter (Signed)
Caller: Ellen/Child; Phone: [phone]; Reason for Call: Daughter/ [name] calling stating she is on the way to [facility].  She wants Dr.  Amador Cunas to know that the facilty called to inform her that EMS was called because pt is yelling with right sided pain.  Note sent via EPIC

## 2013-08-09 NOTE — ED Provider Notes (Signed)
CSN: [id number]     Arrival date & time 08/09/13  4098 History   First MD Initiated Contact with Patient 08/09/13 [phone]     Chief Complaint  Patient presents with  . Rib Injury    HPI Per [facility], pt from [facility] for old bruising and pain to right rib area. Unk fall. Pt A/O per norm with hx of dementia.  Has had falls in th3 past.    Past Medical History  Diagnosis Date  . Irregular heart rate   . Macular degeneration   . Cognitive impairment   . Hypotension   . 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 Glaucoma   . Atrial fibrillation    Past Surgical History  Procedure Laterality Date  . Mohs surgery  2006, 07/06/12    Left Lower Eyelid: Invasive Squamous Cell Carcinoma  . Shave biopsy  05/14/12    Left Lower Forehead  . Skin punch biopsy  05/14/12    Mid Left Lower Eyelid   Family History  Problem Relation Age of Onset  . Skin cancer Sister     Non-melanoma  . Stroke Mother   . Stroke Father   . Diabetes Father    History  Substance Use Topics  . Smoking status: Never Smoker   . Smokeless tobacco: Never Used  . Alcohol Use: No    Review of Systems  Unable to perform ROS: Dementia    Allergies  Review of patient's allergies indicates no known allergies.  Home Medications   Current Outpatient Rx  Name  Route  Sig  Dispense  Refill  . acetaminophen (TYLENOL) 325 MG tablet   Oral   Take 650 mg by mouth 3 (three) times daily.         . bacitracin ophthalmic ointment   Left Eye   Place 1 application into the left eye 3 (three) times daily. apply to eye         . digoxin (LANOXIN) 0.25 MG tablet   Oral   Take 0.25 mg by mouth daily.         . Eyelid Cleansers (OCUSOFT LID SCRUB EX)   Apply externally   Apply 1 application topically daily.         Marland Kitchen HYDROcodone-acetaminophen (NORCO/VICODIN) 5-325 MG per tablet   Oral   Take 1  tablet by mouth every 6 (six) hours as needed for pain.   60 tablet   1   . latanoprost (XALATAN) 0.005 % ophthalmic solution   Both Eyes   Place 1 drop into both eyes at bedtime.         . Memantine HCl ER (NAMENDA XR) 28 MG CP24   Oral   Take 28 mg by mouth daily.         . salsalate (DISALCID) 750 MG tablet   Oral   Take 1 tablet (750 mg total) by mouth 2 (two) times daily.   28 tablet   0   . sertraline (ZOLOFT) 25 MG tablet   Oral   Take 1 tablet (25 mg total) by mouth daily.   30 tablet   11   . warfarin (COUMADIN) 2.5 MG tablet      2.5 mg 5 times per week (5 mg each Tuesday and Sunday)   90 tablet   0   . diphenhydrAMINE (SOMINEX) 25 MG  tablet   Oral   Take 25 mg by mouth at bedtime as needed. itching         . HYDROcodone-acetaminophen (NORCO/VICODIN) 5-325 MG per tablet   Oral   Take 2 tablets by mouth every 4 (four) hours as needed for pain.   10 tablet   0    BP 139/83  Pulse 88  Temp(Src) 98.7 F (37.1 C) (Oral)  Resp 12  SpO2 97% Physical Exam  Nursing note and vitals reviewed. Constitutional: He is oriented to person, place, and time. He appears well-developed and well-nourished. No distress.  HENT:  Head: Normocephalic and atraumatic.  Eyes: Pupils are equal, round, and reactive to light.  Neck: Normal range of motion.  Cardiovascular: Normal rate and intact distal pulses.   Pulmonary/Chest: No respiratory distress.    Abdominal: Normal appearance. He exhibits no distension.  Musculoskeletal: Normal range of motion.  Neurological: He is alert and oriented to person, place, and time. No cranial nerve deficit.  Skin: Skin is warm and dry. No rash noted.  Psychiatric: He has a normal mood and affect. His behavior is normal.    ED Course  Procedures (including critical care time) Labs Review Labs Reviewed  CBC WITH DIFFERENTIAL - Abnormal; Notable for the following:    WBC 10.6 (*)    Monocytes Absolute 1.3 (*)    All other  components within normal limits  BASIC METABOLIC PANEL - Abnormal; Notable for the following:    Sodium 134 (*)    Glucose, Bld 110 (*)    GFR calc non Af Amer 77 (*)    GFR calc Af Amer 89 (*)    All other components within normal limits  PROTIME-INR - Abnormal; Notable for the following:    Prothrombin Time 32.2 (*)    INR 3.28 (*)    All other components within normal limits   Imaging Review Dg Ribs Unilateral W/chest Right  08/09/2013   CLINICAL DATA:  79 year old male with right lower rib pain status post injury.  EXAM: RIGHT RIBS AND CHEST - 3+ [facility]IEW  COMPARISON:  04/15/2013 and earlier.  FINDINGS: Multilevel chronic right lower rib fractures are identified. There is a minimally displaced right anterior 8th rib fracture. Bone mineralization is within normal limits for age.  Chronic cardiomegaly. Chronic hypo ventilation at the right lung base, but increased. No pneumothorax or effusion. No pulmonary edema. Kyphoscoliosis. Aortic calcified atherosclerosis.  IMPRESSION: 1. Mildly displaced right anterior 8th rib fracture. Superimposed chronic right rib fractures at other levels.  2. Right lung base atelectasis. No pneumothorax or effusion. Chronic cardiomegaly.   Electronically Signed   By: Augusto Gamble M.D.   On: 08/09/2013 10:16    MDM   1. Rib fracture, right, closed, initial encounter   2. Supratherapeutic INR        Nelia Shi, MD 08/09/13 2027

## 2013-08-09 NOTE — ED Notes (Signed)
Dr. Beaton at the bedside. 

## 2013-08-09 NOTE — ED Notes (Signed)
Per [facility], pt from [facility] for old bruising and pain to right rib area. Unk fall. Pt A/O per norm with hx of dementia.

## 2013-08-09 NOTE — Telephone Encounter (Signed)
noted 

## 2013-08-13 LAB — PROTIME-INR: INR: 3.4 — AB (ref <=1.1)

## 2013-08-16 ENCOUNTER — Ambulatory Visit (INDEPENDENT_AMBULATORY_CARE_PROVIDER_SITE_OTHER): Payer: Medicare Other | Admitting: General Practice

## 2013-08-16 DIAGNOSIS — I482 Chronic atrial fibrillation, unspecified: Secondary | ICD-10-CM

## 2013-08-16 DIAGNOSIS — Z7901 Long term (current) use of anticoagulants: Secondary | ICD-10-CM

## 2013-08-16 DIAGNOSIS — I4891 Unspecified atrial fibrillation: Secondary | ICD-10-CM

## 2013-08-17 ENCOUNTER — Telehealth: Payer: Self-pay | Admitting: Internal Medicine

## 2013-08-17 NOTE — Telephone Encounter (Signed)
Pt daughter is requesting prilosec call into [facility] for heartburns.

## 2013-08-18 ENCOUNTER — Telehealth: Payer: Self-pay | Admitting: Internal Medicine

## 2013-08-18 MED ORDER — OMEPRAZOLE 20 MG PO CPDR: 20.0000 mg | DELAYED_RELEASE_CAPSULE | Freq: Every day | ORAL | Status: DC

## 2013-08-18 NOTE — Telephone Encounter (Signed)
Rx printed and faxed to [facility] for Prilosec.

## 2013-08-18 NOTE — Telephone Encounter (Signed)
Please advise if okay for Prilosec?

## 2013-08-18 NOTE — Telephone Encounter (Signed)
Order faxed to [facility].

## 2013-08-18 NOTE — Telephone Encounter (Signed)
Howard Payne states that you just faxed over an RX for prilosec 20mg , but she will need an order, not just the RX. Please assist.

## 2013-08-18 NOTE — Telephone Encounter (Signed)
ok 

## 2013-09-02 ENCOUNTER — Ambulatory Visit (INDEPENDENT_AMBULATORY_CARE_PROVIDER_SITE_OTHER): Payer: Medicare Other | Admitting: General Practice

## 2013-09-02 DIAGNOSIS — Z7901 Long term (current) use of anticoagulants: Secondary | ICD-10-CM

## 2013-09-02 DIAGNOSIS — I4891 Unspecified atrial fibrillation: Secondary | ICD-10-CM

## 2013-09-02 DIAGNOSIS — I482 Chronic atrial fibrillation, unspecified: Secondary | ICD-10-CM

## 2013-09-03 ENCOUNTER — Telehealth: Payer: Self-pay | Admitting: Internal Medicine

## 2013-09-03 NOTE — Telephone Encounter (Signed)
[facility] call this afternoon needs to know when should pt next protime level  be drawn. Protime 25.0 and  INR 2.37. Please advise. protime was drawn on 09-02-13.

## 2013-09-06 ENCOUNTER — Ambulatory Visit: Payer: Self-pay | Admitting: Family

## 2013-09-06 ENCOUNTER — Other Ambulatory Visit: Payer: Self-pay | Admitting: Family

## 2013-09-06 DIAGNOSIS — I4891 Unspecified atrial fibrillation: Secondary | ICD-10-CM

## 2013-09-06 DIAGNOSIS — Z7901 Long term (current) use of anticoagulants: Secondary | ICD-10-CM

## 2013-09-06 DIAGNOSIS — I482 Chronic atrial fibrillation, unspecified: Secondary | ICD-10-CM

## 2013-09-06 NOTE — Patient Instructions (Signed)
Continue taking 2.5 mg all days.  Re-check in 4 weeks. Faxed new orders to [facility]  [phone]  Anticoagulation Dose Instructions as of 09/06/2013     Glynis Smiles Tue Wed Thu Fri Sat   New Dose 2.5 mg 2.5 mg 5 mg 2.5 mg 2.5 mg 2.5 mg 5 mg    Description       Continue taking 2.5 mg all days.  Re-check in 4 weeks. Faxed new orders to [name] @ [facility] [address]  [phone]

## 2013-09-07 DIAGNOSIS — Z0279 Encounter for issue of other medical certificate: Secondary | ICD-10-CM

## 2013-09-23 ENCOUNTER — Other Ambulatory Visit: Payer: Self-pay

## 2013-09-30 ENCOUNTER — Ambulatory Visit: Payer: Medicare Other | Admitting: General Practice

## 2013-09-30 DIAGNOSIS — Z7901 Long term (current) use of anticoagulants: Secondary | ICD-10-CM

## 2013-09-30 DIAGNOSIS — I4891 Unspecified atrial fibrillation: Secondary | ICD-10-CM

## 2013-09-30 DIAGNOSIS — I482 Chronic atrial fibrillation, unspecified: Secondary | ICD-10-CM

## 2013-09-30 LAB — PROTIME-INR: INR: 1.8 — AB (ref <=1.1)

## 2013-10-04 ENCOUNTER — Telehealth: Payer: Self-pay | Admitting: Internal Medicine

## 2013-10-04 NOTE — Telephone Encounter (Signed)
Order given for Chest x-ray faxed to [facility] at [phone].

## 2013-10-04 NOTE — Telephone Encounter (Signed)
Pt has not felt well all weekend. Pt is SOB. Pt's diet was also changed diet to heart healthy from puree. Now they want to rule  out aspirations. would like order for chest Xray asap. Fax  [phone]

## 2013-10-04 NOTE — Telephone Encounter (Signed)
Ok for CXR.

## 2013-10-05 ENCOUNTER — Telehealth: Payer: Self-pay | Admitting: Internal Medicine

## 2013-10-05 MED ORDER — GUAIFENESIN ER 600 MG PO TB12: 600.0000 mg | ORAL_TABLET | Freq: Two times a day (BID) | ORAL | Status: DC

## 2013-10-05 MED ORDER — ALBUTEROL SULFATE HFA 108 (90 BASE) MCG/ACT IN AERS: 2.0000 | INHALATION_SPRAY | Freq: Four times a day (QID) | RESPIRATORY_TRACT | Status: DC | PRN

## 2013-10-05 NOTE — Telephone Encounter (Signed)
[facility] Triage Call Report Triage Record Num: [id number] Operator: Craig Guess Patient Name: Howard Payne Call Date & Time: 10/04/2013 6:52:49PM Patient Phone: [phone] PCP: Gordy Savers Patient Gender: Male PCP Fax : [phone] Patient DOB: 14-Aug-1920 [facility] Name: [facility] Reason for Call: Caller: [name]/Med Tech at [facility]; PCP: Eleonore Chiquito (Family Practice > 68yrs old); CB#: [phone]; Call regarding Orders; Caller reports chest xray that was done this afternoon, 10/04/13, indicates this gentleman has Bronchitis. He is afebrile. Caller reports he has no symptoms at present but she was asked by daytime staff to call re Mucinex. She would like order faxed to her at [phone]. EPIC record reviewed. RN called back and asked that nurse who requested she call re Mucinex call this RN. Direct # provided. Return call from Duwayne Heck who reports [name] had some rhonchi in lower left lobe and wheezing bilateral upper lobes this am when she assessed him. His oxygen saturation was 96 Percent on room air and he has been afebrile. Caller asking for Mucinex to loosen congestion. Dr Lucianne Muss contacted, Information given. New orders, "Mucinex 1 tablet po bid and Albuterol inhaler 2 puffs tid" New orders faxed to facility at [phone] as requested. Protocol(s) Used: PCP Calls, No Triage (Adult) Recommended Outcome per Protocol: Call Provider within 24 Hours Reason for Outcome: Lab calling with test results Care Advice: ~

## 2013-10-05 NOTE — Telephone Encounter (Signed)
FYI

## 2013-10-05 NOTE — Telephone Encounter (Signed)
Called [name] at [facility] told her Dr.Kumar gave orders for pt. [name] said that first shift did not get clarification and now the pharmacy needs clarification on dosage of Mucinex and Albuterol Inhaler and wants Dr.K to do it. Told her okay will get orders and fax over. Orders done and faxed to [phone].

## 2013-10-05 NOTE — Telephone Encounter (Signed)
Park Meo w/ [facility] needs clarification on the orders for pt. No Mucinex strength  And.alberterol inhaler unusual to be scheduled Please fax over NEW orders w/ Mucinex strength and if  inhaler needs to be scheduled FAX [phone]

## 2013-10-06 ENCOUNTER — Telehealth: Payer: Self-pay | Admitting: Internal Medicine

## 2013-10-06 NOTE — Telephone Encounter (Signed)
[facility]  [facility] Triage Call Report Triage Record Num: [id number] Operator: Hyman Bower Patient Name: Howard Payne Call Date & Time: 10/05/2013 5:26:49PM Patient Phone: [phone] PCP: Gordy Savers Patient Gender: Male PCP Fax : [phone] Patient DOB: 02/18/1920 Practice Name[facility] Reason for Call: Caller: Libby/Other; PCP: Reather Littler; CB#: [phone]; Call regarding Medication Issue; Medication(s): Rx for cough (Dr Lucianne Muss did not clarify dosage); Daughter of patient reports that the facility where the patient stays has not received clarification on dosage of medication. [facility] [address], [phone]. Per Epic orders, called in Albuterol inhaler, 2 puffs into lungs every 6 hours as needed for wheezing or shortness of breath, dispense 1 with 1 refill and called in Mucinex 600mg  12 hr tablet, take 1 po BID, dispense 60 with 1 refill. Protocol(s) Used: Office Note Recommended Outcome per Protocol: Information Noted and Sent to Office Reason for Outcome: Caller information to office Care Advice: ~

## 2013-10-15 ENCOUNTER — Telehealth: Payer: Self-pay | Admitting: Internal Medicine

## 2013-10-15 NOTE — Telephone Encounter (Signed)
FYI

## 2013-10-15 NOTE — Telephone Encounter (Signed)
Spoke to pt Howard Payne told her Albuterol is only to be used every 6 hours as needed. Told her someone should be able to assess his lung sounds every shift and see if he needs it or if he is SOB it should be given. Howard Payne said she will call them and make sure it is done. Told her okay and I will let Dr. Amador Cunas know about Pallative care.

## 2013-10-15 NOTE — Telephone Encounter (Signed)
Caller: Ellen/Child; Phone: [phone]; Reason for Call: Daughter/Ellen states patient is a resident at [facility].  States patient was prescribed Albuterol Inhaler q 6 hours prn, per Dr.  Amador Cunas, on 10/04/13.   States patient did not receive his initial dosage until 10/08/13.  Daughter states the staff is not administering the inhaler unless family is present to request that it be given.  States staff reports that they are unable to assess the patient for wheezing.  Daughter is requesting that the order be changed from prn to Albuterol inhaler q 6 hours routinely.   Daughter also states she contacted Durene Romans NP from [facility], at [phone], on 10/12/13 to initiate a referral for Palliative Care.  States patient qualified for Palliative Care.  Daughter states she calling to inform Dr.  Amador Cunas that patient has been assessed by Palliative [facility] and met qualifications.  Daughter can be reached at [phone].

## 2013-10-19 ENCOUNTER — Ambulatory Visit: Payer: PRIVATE HEALTH INSURANCE | Admitting: Internal Medicine

## 2013-10-20 ENCOUNTER — Encounter: Payer: Self-pay | Admitting: Internal Medicine

## 2013-11-02 ENCOUNTER — Ambulatory Visit (INDEPENDENT_AMBULATORY_CARE_PROVIDER_SITE_OTHER): Payer: Medicare Other | Admitting: Family

## 2013-11-02 DIAGNOSIS — Z7901 Long term (current) use of anticoagulants: Secondary | ICD-10-CM

## 2013-11-02 DIAGNOSIS — I482 Chronic atrial fibrillation, unspecified: Secondary | ICD-10-CM

## 2013-11-02 DIAGNOSIS — I4891 Unspecified atrial fibrillation: Secondary | ICD-10-CM

## 2013-11-02 LAB — POCT INR: INR: 3.3

## 2013-11-02 NOTE — Patient Instructions (Signed)
Anticoagulation Dose Instructions as of 11/02/2013     Howard Payne Tue Wed Thu Fri Sat   New Dose 2.5 mg 2.5 mg 5 mg 2.5 mg 2.5 mg 2.5 mg 5 mg    Description       Hold coumadin x 1 day. Re-check in 3 weeks. Faxed new orders to [facility] @ [address]  [phone]

## 2013-11-23 LAB — PROTIME-INR

## 2013-11-24 ENCOUNTER — Ambulatory Visit: Payer: Medicare Other | Admitting: Family

## 2013-11-24 DIAGNOSIS — Z7901 Long term (current) use of anticoagulants: Secondary | ICD-10-CM

## 2013-11-24 DIAGNOSIS — I4891 Unspecified atrial fibrillation: Secondary | ICD-10-CM

## 2013-11-24 DIAGNOSIS — I482 Chronic atrial fibrillation, unspecified: Secondary | ICD-10-CM

## 2013-11-24 LAB — POCT INR: INR: 2.2

## 2013-11-24 NOTE — Patient Instructions (Signed)
Continue current dosage. Re-check in 4 weeks. Faxed new orders to [facility] @ [facility]  [phone]  Anticoagulation Dose Instructions as of 11/24/2013     Dorene Grebe Tue Wed Thu Fri Sat   New Dose 2.5 mg 2.5 mg 5 mg 2.5 mg 2.5 mg 2.5 mg 5 mg    Description       Continue current dosage. Re-check in 4 weeks. Faxed new orders to [facility] @ [facility]  [phone]

## 2013-11-29 ENCOUNTER — Ambulatory Visit (INDEPENDENT_AMBULATORY_CARE_PROVIDER_SITE_OTHER): Payer: Medicare Other | Admitting: Internal Medicine

## 2013-11-29 ENCOUNTER — Encounter: Payer: Self-pay | Admitting: Internal Medicine

## 2013-11-29 VITALS — BP 120/70 | HR 65 | Temp 98.5°F | Resp 20 | Wt 190.0 lb

## 2013-11-29 DIAGNOSIS — I482 Chronic atrial fibrillation, unspecified: Secondary | ICD-10-CM

## 2013-11-29 DIAGNOSIS — Z7901 Long term (current) use of anticoagulants: Secondary | ICD-10-CM

## 2013-11-29 DIAGNOSIS — I4891 Unspecified atrial fibrillation: Secondary | ICD-10-CM

## 2013-11-29 DIAGNOSIS — R4189 Other symptoms and signs involving cognitive functions and awareness: Secondary | ICD-10-CM

## 2013-11-29 DIAGNOSIS — F09 Unspecified mental disorder due to known physiological condition: Secondary | ICD-10-CM

## 2013-11-29 NOTE — Patient Instructions (Signed)
Limit your sodium (Salt) intake  Call or return to clinic prn if these symptoms worsen or fail to improve as anticipated.  Return in 6 months for follow-up

## 2013-11-29 NOTE — Progress Notes (Signed)
Pre-visit discussion using our clinic review tool. No additional management support is needed unless otherwise documented below in the visit note.  

## 2013-11-29 NOTE — Progress Notes (Signed)
Subjective:    Patient ID: Howard Payne, male    DOB: 05/02/1920, 80 y.o.   MRN: [id number]  HPI  80 year old patient who has a history of cognitive impairment chronic atrial fibrillation maintained on chronic Coumadin anticoagulation. For the past several days he has had increasing cough and mild chest congestion with wheezing. He is a resident at [facility] and has required occasional nebulizer treatments with albuterol. There's been no fever. In general doing quite well Seems to be eating well.  Risks and benefits of Coumadin anticoagulation discussed at length  Past Medical History  Diagnosis Date  . Irregular heart rate   . Macular degeneration   . Cognitive impairment   . Hypotension   . 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 Glaucoma   . Atrial fibrillation     History   Social History  . Marital Status: Widowed    Spouse Name: N/A    Number of Children: N/A  . Years of Education: N/A   Occupational History  . Not on file.   Social History Main Topics  . Smoking status: Never Smoker   . Smokeless tobacco: Never Used  . Alcohol Use: No  . Drug Use: No  . Sexual Activity: Not on file   Other Topics Concern  . Not on file   Social History Narrative  . No narrative on file    Past Surgical History  Procedure Laterality Date  . Mohs surgery  2006, 07/06/12    Left Lower Eyelid: Invasive Squamous Cell Carcinoma  . Shave biopsy  05/14/12    Left Lower Forehead  . Skin punch biopsy  05/14/12    Mid Left Lower Eyelid    Family History  Problem Relation Age of Onset  . Skin cancer Sister     Non-melanoma  . Stroke Mother   . Stroke Father   . Diabetes Father     No Known Allergies  Current Outpatient Prescriptions on File Prior to Visit  Medication Sig Dispense Refill  . acetaminophen (TYLENOL) 325 MG tablet Take 650 mg by mouth 3 (three) times  daily.      Marland Kitchen albuterol (PROVENTIL HFA;VENTOLIN HFA) 108 (90 BASE) MCG/ACT inhaler Inhale 2 puffs into the lungs every 6 (six) hours as needed for wheezing or shortness of breath.  1 Inhaler  1  . bacitracin ophthalmic ointment Place 1 application into the left eye 3 (three) times daily. apply to eye      . digoxin (LANOXIN) 0.25 MG tablet Take 0.25 mg by mouth daily.      . diphenhydrAMINE (SOMINEX) 25 MG tablet Take 25 mg by mouth at bedtime as needed. itching      . Eyelid Cleansers (OCUSOFT LID SCRUB EX) Apply 1 application topically daily.      Marland Kitchen HYDROcodone-acetaminophen (NORCO/VICODIN) 5-325 MG per tablet Take 1 tablet by mouth every 6 (six) hours as needed for pain.  60 tablet  1  . latanoprost (XALATAN) 0.005 % ophthalmic solution Place 1 drop into both eyes at bedtime.      . Memantine HCl ER (NAMENDA XR) 28 MG CP24 Take 28 mg by mouth daily.      Marland Kitchen omeprazole (PRILOSEC) 20 MG capsule Take 1 capsule (20 mg total) by mouth daily.  90 capsule  3  . salsalate (DISALCID) 750 MG tablet Take 1 tablet (750 mg  total) by mouth 2 (two) times daily.  28 tablet  0  . sertraline (ZOLOFT) 25 MG tablet Take 1 tablet (25 mg total) by mouth daily.  30 tablet  11  . warfarin (COUMADIN) 2.5 MG tablet 2.5 mg 5 times per week (5 mg each Tuesday and Sunday)  90 tablet  0  . guaiFENesin (MUCINEX) 600 MG 12 hr tablet Take 1 tablet (600 mg total) by mouth 2 (two) times daily.  60 tablet  1   No current facility-administered medications on file prior to visit.    BP 120/70  Pulse 65  Temp(Src) 98.5 F (36.9 C) (Oral)  Resp 20  Wt 190 lb (86.183 kg)  SpO2 95%       Review of Systems  Constitutional: Negative for fever, chills, appetite change and fatigue.  HENT: Negative for congestion, dental problem, ear pain, hearing loss, sore throat, tinnitus, trouble swallowing and voice change.   Eyes: Negative for pain, discharge and visual disturbance.  Respiratory: Positive for cough and wheezing.  Negative for chest tightness and stridor.   Cardiovascular: Negative for chest pain, palpitations and leg swelling.  Gastrointestinal: Negative for nausea, vomiting, abdominal pain, diarrhea, constipation, blood in stool and abdominal distention.  Genitourinary: Negative for urgency, hematuria, flank pain, discharge, difficulty urinating and genital sores.  Musculoskeletal: Negative for arthralgias, back pain, gait problem, joint swelling, myalgias and neck stiffness.  Skin: Negative for rash.  Neurological: Negative for dizziness, syncope, speech difficulty, weakness, numbness and headaches.  Hematological: Negative for adenopathy. Does not bruise/bleed easily.  Psychiatric/Behavioral: Negative for behavioral problems and dysphoric mood. The patient is not nervous/anxious.        Objective:   Physical Exam  Constitutional: He is oriented to person, place, and time. He appears well-developed.  HENT:  Head: Normocephalic.  Right Ear: External ear normal.  Left Ear: External ear normal.  Eyes: Conjunctivae and EOM are normal.  Neck: Normal range of motion.  Cardiovascular: Normal rate and normal heart sounds.   Slow ventricular response  Pulmonary/Chest: Effort normal. He has no wheezes.  Bronchial breath sounds but no audible wheezing. No cough. Rare rhonchi  Abdominal: Bowel sounds are normal.  Musculoskeletal: Normal range of motion. He exhibits no edema and no tenderness.  Neurological: He is alert and oriented to person, place, and time.  Psychiatric: He has a normal mood and affect. His behavior is normal.          Assessment & Plan:   Viral URI. Will continue the nebulizer treatments with albuterol when necessary as well as expectorants. No indications for antibiotic therapy Atrial fibrillation. Decrease Lanoxin to 0.125 mg daily Chronic Coumadin anticoagulation discussed at length risk of anticoagulation has increased with age but stroke risk also has increased with age  if off anticoagulation.

## 2013-12-23 ENCOUNTER — Ambulatory Visit (INDEPENDENT_AMBULATORY_CARE_PROVIDER_SITE_OTHER): Payer: Self-pay | Admitting: General Practice

## 2013-12-23 DIAGNOSIS — Z7901 Long term (current) use of anticoagulants: Secondary | ICD-10-CM

## 2013-12-23 DIAGNOSIS — I482 Chronic atrial fibrillation, unspecified: Secondary | ICD-10-CM

## 2013-12-23 DIAGNOSIS — I4891 Unspecified atrial fibrillation: Secondary | ICD-10-CM

## 2013-12-23 DIAGNOSIS — Z5181 Encounter for therapeutic drug level monitoring: Secondary | ICD-10-CM | POA: Insufficient documentation

## 2013-12-23 LAB — PROTIME-INR: INR: 2.8 — AB (ref <=1.1)

## 2013-12-23 NOTE — Progress Notes (Signed)
Pre-visit discussion using our clinic review tool. No additional management support is needed unless otherwise documented below in the visit note.  

## 2014-01-03 ENCOUNTER — Ambulatory Visit: Payer: PRIVATE HEALTH INSURANCE | Admitting: Internal Medicine

## 2014-01-20 LAB — PROTIME-INR: INR: 2.1 — AB (ref 0.9–1.1)

## 2014-01-26 ENCOUNTER — Ambulatory Visit (INDEPENDENT_AMBULATORY_CARE_PROVIDER_SITE_OTHER): Payer: PRIVATE HEALTH INSURANCE | Admitting: General Practice

## 2014-01-26 DIAGNOSIS — Z5181 Encounter for therapeutic drug level monitoring: Secondary | ICD-10-CM

## 2014-01-26 DIAGNOSIS — Z7901 Long term (current) use of anticoagulants: Secondary | ICD-10-CM

## 2014-01-26 DIAGNOSIS — I4891 Unspecified atrial fibrillation: Secondary | ICD-10-CM

## 2014-01-26 NOTE — Progress Notes (Signed)
Pre visit review using our clinic review tool, if applicable. No additional management support is needed unless otherwise documented below in the visit note. 

## 2014-02-17 LAB — PROTIME-INR: INR: 1.9 — AB (ref <=1.1)

## 2014-02-22 ENCOUNTER — Ambulatory Visit (INDEPENDENT_AMBULATORY_CARE_PROVIDER_SITE_OTHER): Payer: PRIVATE HEALTH INSURANCE | Admitting: General Practice

## 2014-02-22 NOTE — Progress Notes (Signed)
Pre visit review using our clinic review tool, if applicable. No additional management support is needed unless otherwise documented below in the visit note. 

## 2014-03-01 ENCOUNTER — Telehealth: Payer: Self-pay | Admitting: Internal Medicine

## 2014-03-01 NOTE — Telephone Encounter (Signed)
Please see message below

## 2014-03-01 NOTE — Telephone Encounter (Signed)
Has Dr. Burnice Logan changed our dad's blood thinner Rx? I just read the report and his report came back 1.9 while the usual range only goes up to 1.1. Daddy gets his blood checked where he lives at [facility] on [address]. I'm assuming they contacted Dr. Raliegh Ip to let him know? Thank you for checking on this! Dorian Pod Linus Mako) Para March again / [email or web address] / [phone]

## 2014-03-02 ENCOUNTER — Telehealth: Payer: Self-pay | Admitting: General Practice

## 2014-03-02 NOTE — Telephone Encounter (Signed)
I called and spoke with patient's daughter this morning in regards to her prior message.

## 2014-03-04 ENCOUNTER — Observation Stay (HOSPITAL_COMMUNITY)
Admission: EM | Admit: 2014-03-04 | Discharge: 2014-03-05 | Disposition: A | Discharge status: Home / Self Care | Payer: Medicare Other | Attending: Internal Medicine | Admitting: Internal Medicine

## 2014-03-04 ENCOUNTER — Encounter (HOSPITAL_COMMUNITY): Payer: Self-pay | Admitting: Emergency Medicine

## 2014-03-04 ENCOUNTER — Emergency Department (HOSPITAL_COMMUNITY): Payer: Medicare Other

## 2014-03-04 DIAGNOSIS — H3530 Unspecified macular degeneration: Secondary | ICD-10-CM | POA: Insufficient documentation

## 2014-03-04 DIAGNOSIS — D369 Benign neoplasm, unspecified site: Secondary | ICD-10-CM | POA: Insufficient documentation

## 2014-03-04 DIAGNOSIS — I4891 Unspecified atrial fibrillation: Secondary | ICD-10-CM | POA: Diagnosis present

## 2014-03-04 DIAGNOSIS — I499 Cardiac arrhythmia, unspecified: Secondary | ICD-10-CM | POA: Insufficient documentation

## 2014-03-04 DIAGNOSIS — Z79899 Other long term (current) drug therapy: Secondary | ICD-10-CM | POA: Insufficient documentation

## 2014-03-04 DIAGNOSIS — I959 Hypotension, unspecified: Secondary | ICD-10-CM | POA: Insufficient documentation

## 2014-03-04 DIAGNOSIS — F09 Unspecified mental disorder due to known physiological condition: Secondary | ICD-10-CM

## 2014-03-04 DIAGNOSIS — H409 Unspecified glaucoma: Secondary | ICD-10-CM | POA: Insufficient documentation

## 2014-03-04 DIAGNOSIS — R0789 Other chest pain: Principal | ICD-10-CM | POA: Insufficient documentation

## 2014-03-04 DIAGNOSIS — R5381 Other malaise: Secondary | ICD-10-CM | POA: Insufficient documentation

## 2014-03-04 DIAGNOSIS — R079 Chest pain, unspecified: Secondary | ICD-10-CM | POA: Diagnosis present

## 2014-03-04 DIAGNOSIS — R4189 Other symptoms and signs involving cognitive functions and awareness: Secondary | ICD-10-CM

## 2014-03-04 DIAGNOSIS — Z7901 Long term (current) use of anticoagulants: Secondary | ICD-10-CM

## 2014-03-04 DIAGNOSIS — Z85828 Personal history of other malignant neoplasm of skin: Secondary | ICD-10-CM | POA: Insufficient documentation

## 2014-03-04 DIAGNOSIS — R5383 Other fatigue: Secondary | ICD-10-CM

## 2014-03-04 HISTORY — DX: Gastro-esophageal reflux disease without esophagitis: K21.9

## 2014-03-04 HISTORY — DX: Cardiac arrhythmia, unspecified: I49.9

## 2014-03-04 LAB — CBC WITH DIFFERENTIAL/PLATELET
BASOS ABS: 0 10*3/uL (ref 0.0–0.1)
Basophils Relative: 0 % (ref 0–1)
EOS PCT: 4 % (ref 0–5)
Eosinophils Absolute: 0.3 10*3/uL (ref 0.0–0.7)
HCT: 40.3 % (ref 39.0–52.0)
Hemoglobin: 13.5 g/dL (ref 13.0–17.0)
LYMPHS ABS: 1.4 10*3/uL (ref 0.7–4.0)
Lymphocytes Relative: 21 % (ref 12–46)
MCH: 32.1 pg (ref 26.0–34.0)
MCHC: 33.5 g/dL (ref 30.0–36.0)
MCV: 95.7 fL (ref 78.0–100.0)
Monocytes Absolute: 0.7 10*3/uL (ref 0.1–1.0)
Monocytes Relative: 10 % (ref 3–12)
NEUTROS ABS: 4.4 10*3/uL (ref 1.7–7.7)
Neutrophils Relative %: 65 % (ref 43–77)
PLATELETS: 182 10*3/uL (ref 150–400)
RBC: 4.21 MIL/uL — AB (ref 4.22–5.81)
RDW: 14.4 % (ref 11.5–15.5)
WBC: 6.8 10*3/uL (ref 4.0–10.5)

## 2014-03-04 LAB — CREATININE, SERUM
Creatinine, Ser: 0.79 mg/dL (ref 0.50–1.35)
GFR calc Af Amer: 88 mL/min — ABNORMAL LOW (ref >90)
GFR calc non Af Amer: 76 mL/min — ABNORMAL LOW (ref >90)

## 2014-03-04 LAB — BASIC METABOLIC PANEL
BUN: 18 mg/dL (ref 6–23)
CO2: 23 meq/L (ref 19–32)
CREATININE: 0.81 mg/dL (ref 0.50–1.35)
Calcium: 8.9 mg/dL (ref 8.4–10.5)
Chloride: 105 mEq/L (ref 96–112)
GFR, EST AFRICAN AMERICAN: 87 mL/min — AB (ref >90)
GFR, EST NON AFRICAN AMERICAN: 75 mL/min — AB (ref >90)
Glucose, Bld: 156 mg/dL — ABNORMAL HIGH (ref 70–99)
Potassium: 4.1 mEq/L (ref 3.7–5.3)
SODIUM: 141 meq/L (ref 137–147)

## 2014-03-04 LAB — TSH: TSH: 1.86 u[IU]/mL (ref 0.350–4.500)

## 2014-03-04 LAB — CBC
HEMATOCRIT: 40.4 % (ref 39.0–52.0)
HEMOGLOBIN: 13.5 g/dL (ref 13.0–17.0)
MCH: 32.1 pg (ref 26.0–34.0)
MCHC: 33.4 g/dL (ref 30.0–36.0)
MCV: 96.2 fL (ref 78.0–100.0)
Platelets: 190 10*3/uL (ref 150–400)
RBC: 4.2 MIL/uL — AB (ref 4.22–5.81)
RDW: 14.4 % (ref 11.5–15.5)
WBC: 7.8 10*3/uL (ref 4.0–10.5)

## 2014-03-04 LAB — HEMOGLOBIN A1C
HEMOGLOBIN A1C: 6.1 % — AB (ref <5.7)
Mean Plasma Glucose: 128 mg/dL — ABNORMAL HIGH (ref <117)

## 2014-03-04 LAB — PROTIME-INR
INR: 1.85 — ABNORMAL HIGH (ref 0.00–1.49)
Prothrombin Time: 20.8 seconds — ABNORMAL HIGH (ref 11.6–15.2)

## 2014-03-04 LAB — TROPONIN I

## 2014-03-04 LAB — DIGOXIN LEVEL: Digoxin Level: 0.4 ng/mL — ABNORMAL LOW (ref 0.8–2.0)

## 2014-03-04 LAB — I-STAT TROPONIN, ED: TROPONIN I, POC: 0.01 ng/mL (ref 0.00–0.08)

## 2014-03-04 MED ORDER — DIPHENHYDRAMINE HCL 25 MG PO CAPS
25.0000 mg | ORAL_CAPSULE | Freq: Every evening | ORAL | Status: DC | PRN
  Administered 2014-03-04: 25 mg via ORAL
  Filled 2014-03-04: qty 1

## 2014-03-04 MED ORDER — DIPHENHYDRAMINE HCL (SLEEP) 25 MG PO TABS: 25.0000 mg | ORAL_TABLET | Freq: Every evening | ORAL | Status: DC | PRN

## 2014-03-04 MED ORDER — ACETAMINOPHEN 325 MG PO TABS
650.0000 mg | ORAL_TABLET | Freq: Three times a day (TID) | ORAL | Status: DC
  Administered 2014-03-04: 650 mg via ORAL
  Filled 2014-03-04: qty 2

## 2014-03-04 MED ORDER — MEMANTINE HCL ER 28 MG PO CP24
28.0000 mg | ORAL_CAPSULE | Freq: Every day | ORAL | Status: DC
  Filled 2014-03-04: qty 28

## 2014-03-04 MED ORDER — ONDANSETRON HCL 4 MG/2ML IJ SOLN: 4.0000 mg | Freq: Four times a day (QID) | INTRAMUSCULAR | Status: DC | PRN

## 2014-03-04 MED ORDER — HYDROCODONE-ACETAMINOPHEN 5-325 MG PO TABS: 1.0000 | ORAL_TABLET | ORAL | Status: DC | PRN

## 2014-03-04 MED ORDER — SODIUM CHLORIDE 0.9 % IV SOLN: 250.0000 mL | INTRAVENOUS | Status: DC | PRN

## 2014-03-04 MED ORDER — ONDANSETRON HCL 4 MG PO TABS: 4.0000 mg | ORAL_TABLET | Freq: Four times a day (QID) | ORAL | Status: DC | PRN

## 2014-03-04 MED ORDER — LATANOPROST 0.005 % OP SOLN
1.0000 [drp] | Freq: Every day | OPHTHALMIC | Status: DC
  Administered 2014-03-04: 1 [drp] via OPHTHALMIC
  Filled 2014-03-04: qty 2.5

## 2014-03-04 MED ORDER — WARFARIN - PHYSICIAN DOSING INPATIENT: Freq: Every day | Status: DC

## 2014-03-04 MED ORDER — WARFARIN SODIUM 1 MG PO TABS: 1.0000 mg | ORAL_TABLET | ORAL | Status: DC

## 2014-03-04 MED ORDER — SODIUM CHLORIDE 0.9 % IJ SOLN: 3.0000 mL | Freq: Two times a day (BID) | INTRAMUSCULAR | Status: DC

## 2014-03-04 MED ORDER — POLYETHYLENE GLYCOL 3350 17 G PO PACK
17.0000 g | PACK | Freq: Every day | ORAL | Status: DC | PRN
  Filled 2014-03-04: qty 1

## 2014-03-04 MED ORDER — WARFARIN SODIUM 2.5 MG PO TABS
2.5000 mg | ORAL_TABLET | ORAL | Status: DC
  Filled 2014-03-04: qty 1

## 2014-03-04 MED ORDER — SERTRALINE HCL 25 MG PO TABS
25.0000 mg | ORAL_TABLET | Freq: Every day | ORAL | Status: DC
  Filled 2014-03-04: qty 1

## 2014-03-04 MED ORDER — SALSALATE 750 MG PO TABS
750.0000 mg | ORAL_TABLET | Freq: Two times a day (BID) | ORAL | Status: DC
  Administered 2014-03-04: 750 mg via ORAL
  Filled 2014-03-04 (×3): qty 1

## 2014-03-04 MED ORDER — ALBUTEROL SULFATE (2.5 MG/3ML) 0.083% IN NEBU: 3.0000 mL | INHALATION_SOLUTION | Freq: Four times a day (QID) | RESPIRATORY_TRACT | Status: DC | PRN

## 2014-03-04 MED ORDER — HEPARIN SODIUM (PORCINE) 5000 UNIT/ML IJ SOLN
5000.0000 [IU] | Freq: Three times a day (TID) | INTRAMUSCULAR | Status: DC
  Filled 2014-03-04 (×3): qty 1

## 2014-03-04 MED ORDER — WARFARIN SODIUM 3 MG PO TABS
3.0000 mg | ORAL_TABLET | Freq: Once | ORAL | Status: AC
  Administered 2014-03-04: 3 mg via ORAL
  Filled 2014-03-04: qty 1

## 2014-03-04 MED ORDER — POLYVINYL ALCOHOL 1.4 % OP SOLN
1.0000 [drp] | Freq: Every day | OPHTHALMIC | Status: DC
  Administered 2014-03-04: 1 [drp] via OPHTHALMIC
  Filled 2014-03-04: qty 15

## 2014-03-04 MED ORDER — POLYETHYL GLYCOL-PROPYL GLYCOL 0.4-0.3 % OP GEL: 1.0000 "application " | Freq: Every day | OPHTHALMIC | Status: DC

## 2014-03-04 MED ORDER — PANTOPRAZOLE SODIUM 40 MG PO TBEC
40.0000 mg | DELAYED_RELEASE_TABLET | Freq: Every day | ORAL | Status: DC
  Filled 2014-03-04: qty 1

## 2014-03-04 MED ORDER — HALOPERIDOL LACTATE 5 MG/ML IJ SOLN: 1.0000 mg | Freq: Four times a day (QID) | INTRAMUSCULAR | Status: DC | PRN

## 2014-03-04 MED ORDER — INSULIN ASPART 100 UNIT/ML ~~LOC~~ SOLN: 0.0000 [IU] | Freq: Three times a day (TID) | SUBCUTANEOUS | Status: DC

## 2014-03-04 MED ORDER — WARFARIN - PHARMACIST DOSING INPATIENT
Freq: Every day | Status: DC
  Administered 2014-03-04: 22:00:00

## 2014-03-04 MED ORDER — DIGOXIN 250 MCG PO TABS
0.2500 mg | ORAL_TABLET | Freq: Every day | ORAL | Status: DC
  Administered 2014-03-05: 0.25 mg via ORAL
  Filled 2014-03-04: qty 1

## 2014-03-04 MED ORDER — GUAIFENESIN-CODEINE 100-10 MG/5ML PO SOLN: 5.0000 mL | Freq: Four times a day (QID) | ORAL | Status: DC | PRN

## 2014-03-04 MED ORDER — HYDROCODONE-ACETAMINOPHEN 5-325 MG PO TABS: 1.0000 | ORAL_TABLET | Freq: Four times a day (QID) | ORAL | Status: DC | PRN

## 2014-03-04 MED ORDER — RAMELTEON 8 MG PO TABS
8.0000 mg | ORAL_TABLET | Freq: Every day | ORAL | Status: DC
  Administered 2014-03-04: 8 mg via ORAL
  Filled 2014-03-04 (×2): qty 1

## 2014-03-04 MED ORDER — SODIUM CHLORIDE 0.9 % IJ SOLN
3.0000 mL | INTRAMUSCULAR | Status: DC | PRN
Start: 2014-03-04 — End: 2014-03-05

## 2014-03-04 NOTE — ED Notes (Signed)
Pt wanted to sit in the chair. Placed in chair with grand-daughter at the bedside. TV on and pt feeling a little more comfortable at this time.

## 2014-03-04 NOTE — ED Notes (Signed)
Phlebotomy at the bedside  

## 2014-03-04 NOTE — ED Provider Notes (Signed)
CSN: [id number]     Arrival date & time 03/04/14  1042 History   First MD Initiated Contact with Patient 03/04/14 1047     Chief Complaint  Patient presents with  . Chest Pain      Patient is a 80 y.o. male presenting with chest pain. The history is provided by the patient, the nursing home and a relative.  Chest Pain Pain location:  L chest Pain quality: aching   Pain radiates to:  Does not radiate Pain severity:  Moderate Duration: unable to determine duration. Timing:  Constant Progression:  Resolved Chronicity:  New Relieved by:  Nothing Worsened by:  Nothing tried Associated symptoms: fatigue   Associated symptoms: no abdominal pain, no dizziness, no fever, no shortness of breath, no syncope and not vomiting   Pt reports earlier this morning he had episode of Left CP. No SOB/vomting/dizziness reported He reports recent fatigue over past several days  Pt lives at [facility].  I spoke to his caregiver tiffany, and she reports he mentioned left CP today.  She reports he did not appear SOB.  He was not vomiting.  No recent falls.  He was otherwise at his baseline.  Pt reports he is improved at this time Daughter present and she helps verify story Pt is a [employer], clinical.    He has been given ASA prior to arrival   Past Medical History  Diagnosis Date  . Irregular heart rate   . Macular degeneration   . Cognitive impairment   . Hypotension   . 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 Glaucoma   . Atrial fibrillation    Past Surgical History  Procedure Laterality Date  . Mohs surgery  2006, 07/06/12    Left Lower Eyelid: Invasive Squamous Cell Carcinoma  . Shave biopsy  05/14/12    Left Lower Forehead  . Skin punch biopsy  05/14/12    Mid Left Lower Eyelid   Family History  Problem Relation Age of Onset  . Skin cancer Sister     Non-melanoma  . Stroke Mother   .  Stroke Father   . Diabetes Father    History  Substance Use Topics  . Smoking status: Never Smoker   . Smokeless tobacco: Never Used  . Alcohol Use: No    Review of Systems  Constitutional: Positive for fatigue. Negative for fever.  Respiratory: Negative for shortness of breath.   Cardiovascular: Positive for chest pain. Negative for syncope.  Gastrointestinal: Negative for vomiting and abdominal pain.  Neurological: Negative for dizziness and syncope.  All other systems reviewed and are negative.     Allergies  Review of patient's allergies indicates no known allergies.  Home Medications   Prior to Admission medications   Medication Sig Start Date End Date Taking? Authorizing Provider  acetaminophen (TYLENOL) 325 MG tablet Take 650 mg by mouth 3 (three) times daily.   Yes Historical Provider, MD  albuterol (PROVENTIL HFA;VENTOLIN HFA) 108 (90 BASE) MCG/ACT inhaler Inhale 2 puffs into the lungs every 6 (six) hours as needed for wheezing or shortness of breath. 10/05/13  Yes Marletta Lor, MD  diclofenac sodium (VOLTAREN) 1 % GEL Apply 2 g topically 4 (four) times daily as needed.   Yes Historical Provider, MD  digoxin (LANOXIN) 0.25 MG tablet Take 0.25 mg by mouth daily.   Yes Historical Provider, MD  diphenhydrAMINE (SOMINEX) 25 MG tablet Take 25 mg by mouth at bedtime as needed. itching   Yes Historical Provider, MD  Eyelid Cleansers (OCUSOFT LID SCRUB EX) Apply 1 application topically daily.   Yes Historical Provider, MD  guaiFENesin (MUCINEX) 600 MG 12 hr tablet Take 600 mg by mouth 2 (two) times daily as needed for cough or to loosen phlegm. 10/05/13  Yes Marletta Lor, MD  guaiFENesin-codeine [facility]) 100-10 MG/5ML syrup Take 5 mLs by mouth 4 (four) times daily as needed for cough.   Yes Historical Provider, MD  HYDROcodone-acetaminophen (NORCO/VICODIN) 5-325 MG per tablet Take 1 tablet by mouth every 6 (six) hours as needed for pain. 07/27/13  Yes Marletta Lor, MD  latanoprost (XALATAN) 0.005 % ophthalmic solution Place 1 drop into both eyes at bedtime.   Yes Historical Provider, MD  Memantine HCl ER (NAMENDA XR) 28 MG CP24 Take 28 mg by mouth daily.   Yes Historical Provider, MD  omeprazole (PRILOSEC) 20 MG capsule Take 1 capsule (20 mg total) by mouth daily. 08/18/13  Yes Marletta Lor, MD  Polyethyl Glycol-Propyl Glycol (SYSTANE) 0.4-0.3 % GEL Apply 1 application to eye at bedtime.   Yes Historical Provider, MD  salsalate (DISALCID) 750 MG tablet Take 1 tablet (750 mg total) by mouth 2 (two) times daily. 07/27/13  Yes Marletta Lor, MD  warfarin (COUMADIN) 1 MG tablet Take 1 mg by mouth 2 (two) times a week. Takes on Tuesday and Sauturday   Yes Historical Provider, MD  warfarin (COUMADIN) 2.5 MG tablet Take 2.5 mg by mouth daily. Takes all days except Tuesday and Saturday 01/13/13  Yes Marletta Lor, MD  White Petrolatum-Mineral Oil (PURALUBE) 85-15 % OINT Apply 1 application to eye 3 (three) times daily.   Yes Historical Provider, MD  sertraline (ZOLOFT) 25 MG tablet Take 1 tablet (25 mg total) by mouth daily. 09/15/12   Marletta Lor, MD   BP 155/77  Pulse 73  Temp(Src) 98.2 F (36.8 C) (Oral)  Resp 19  SpO2 95% Physical Exam CONSTITUTIONAL: elderly, frail HEAD: Normocephalic/atraumatic EYES: EOMI/PERRL ENMT: Mucous membranes moist NECK: supple no meningeal signs SPINE:entire spine nontender CV: irregular, no murmurs noted LUNGS: Lungs are clear to auscultation bilaterally, no apparent distress ABDOMEN: soft, nontender, no rebound or guarding GU:no cva tenderness NEURO: Pt is awake/alert, moves all extremitiesx4, no facial droop, no arm/leg drift EXTREMITIES: pulses normal, full ROM SKIN: warm, color normal PSYCH: no abnormalities of mood noted  ED Course  Procedures  11:36 AM Pt with episode of CP earlier, now resolved He feels well/improved and at baseline Currently in rate controlled  afib Labs/imaging pending at this time 2:03 PM Pt without recurrent pain, however given his history of CP and risk factors will admit D/w dr Venetia Constable, will admit Family reports pt is a DNR but would appreciate medical management  Labs Review Labs Reviewed  CBC WITH DIFFERENTIAL - Abnormal; Notable for the following:    RBC 4.21 (*)    All other components within normal limits  BASIC METABOLIC PANEL  PROTIME-INR  DIGOXIN LEVEL  I-STAT TROPOININ, ED    Imaging Review Dg Chest Portable 1 View  03/04/2014   CLINICAL DATA:  Left lower lateral chest discomfort  EXAM: PORTABLE CHEST - 1 VIEW  COMPARISON:  DG RIBS UNILATERAL W/CHEST*R* dated 08/09/2013  FINDINGS: The lungs are adequately inflated. There is no alveolar infiltrate. The interstitial markings are mildly increased as compared to the previous study. The cardiopericardial  silhouette is mildly enlarged. The pulmonary vascularity is not engorged. There is no pleural effusion or pneumothorax. The observed portions of the bony thorax exhibit diffuse osteopenia. There are degenerative changes of the right shoulder.  IMPRESSION: There is no evidence of pneumonia. I cannot exclude low-grade compensated CHF in the appropriate clinical setting.   Electronically Signed   By: David  Martinique   On: 03/04/2014 12:33     EKG Interpretation   Date/Time:  Friday March 04 2014 10:50:04 EDT Ventricular Rate:  72 PR Interval:  120 QRS Duration: 87 QT Interval:  490 QTC Calculation: 536 R Axis:   57 Text Interpretation:  ?atrial fibrillation Atrial premature complexes  Sinus pause Nonspecific repol abnormality, diffuse leads artfact noted  Confirmed by Christy Gentles  MD, Elenore Rota ([id number]) on 03/04/2014 11:01:19 AM      MDM   Final diagnoses:  Chest pain  Atrial fibrillation    Nursing notes including past medical history and social history reviewed and considered in documentation xrays reviewed and considered Labs/vital reviewed and  considered     Sharyon Cable, MD 03/04/14 1404

## 2014-03-04 NOTE — H&P (Signed)
[facility] History and Physical  Howard Payne SEG:[id number] DOB: November 06, 1920 DOA: 03/04/2014  Referring physician: Dr. Christy Gentles PCP: Nyoka Cowden, MD   Chief Complaint: chest pain  HPI: Howard Payne is a 80 y.o. male  Past medical history of skin cancer and atrial fibrillation on Coumadin that comes in for chest pain is started on the day of admission. It's on the left. The patient not only details about what makes it better or worse. He relates has no relationship with fluids. He denies any shortness of breath nausea vomiting or diarrhea with this chest pain. He relates no fever cough or disease spells.  In the ED: Basic metabolic panel was done that was within normal limits were set of cardiac enzymes are negative, his INR was 1.8 this level 0.4. X-ray has been no EKG is below.   Review of Systems:  Constitutional:  No weight loss, night sweats, Fevers, chills, fatigue.  HEENT:  No headaches, Difficulty swallowing,Tooth/dental problems,Sore throat,  No sneezing, itching, ear ache, nasal congestion, post nasal drip,  Cardio-vascular:  No chest pain, Orthopnea, PND, swelling in lower extremities, anasarca, dizziness, palpitations  GI:  No heartburn, indigestion, abdominal pain, nausea, vomiting, diarrhea, change in bowel habits, loss of appetite  Resp:  No shortness of breath with exertion or at rest. No excess mucus, no productive cough, No non-productive cough, No coughing up of blood.No change in color of mucus.No wheezing.No chest wall deformity  Skin:  no rash or lesions.  GU:  no dysuria, change in color of urine, no urgency or frequency. No flank pain.  Musculoskeletal:  No joint pain or swelling. No decreased range of motion. No back pain.  Psych:  No change in mood or affect. No depression or anxiety. No memory loss.   Past Medical History  Diagnosis Date  . Irregular heart rate   . Macular degeneration   . Cognitive impairment   . Hypotension    . 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 Glaucoma   . Atrial fibrillation    Past Surgical History  Procedure Laterality Date  . Mohs surgery  2006, 07/06/12    Left Lower Eyelid: Invasive Squamous Cell Carcinoma  . Shave biopsy  05/14/12    Left Lower Forehead  . Skin punch biopsy  05/14/12    Mid Left Lower Eyelid   Social History:  reports that he has never smoked. He has never used smokeless tobacco. He reports that he does not drink alcohol or use illicit drugs.  No Known Allergies  Family History  Problem Relation Age of Onset  . Skin cancer Sister     Non-melanoma  . Stroke Mother   . Stroke Father   . Diabetes Father      Prior to Admission medications   Medication Sig Start Date End Date Taking? Authorizing Provider  acetaminophen (TYLENOL) 325 MG tablet Take 650 mg by mouth 3 (three) times daily.   Yes Historical Provider, MD  albuterol (PROVENTIL HFA;VENTOLIN HFA) 108 (90 BASE) MCG/ACT inhaler Inhale 2 puffs into the lungs every 6 (six) hours as needed for wheezing or shortness of breath. 10/05/13  Yes Marletta Lor, MD  diclofenac sodium (VOLTAREN) 1 % GEL Apply 2 g topically 4 (four) times daily as needed.   Yes Historical Provider, MD  digoxin (LANOXIN) 0.25 MG tablet Take 0.25 mg by mouth daily.  Yes Historical Provider, MD  diphenhydrAMINE (SOMINEX) 25 MG tablet Take 25 mg by mouth at bedtime as needed. itching   Yes Historical Provider, MD  Eyelid Cleansers (OCUSOFT LID SCRUB EX) Apply 1 application topically daily.   Yes Historical Provider, MD  guaiFENesin (MUCINEX) 600 MG 12 hr tablet Take 600 mg by mouth 2 (two) times daily as needed for cough or to loosen phlegm. 10/05/13  Yes Marletta Lor, MD  guaiFENesin-codeine [facility]) 100-10 MG/5ML syrup Take 5 mLs by mouth 4 (four) times daily as needed for cough.   Yes Historical Provider, MD    HYDROcodone-acetaminophen (NORCO/VICODIN) 5-325 MG per tablet Take 1 tablet by mouth every 6 (six) hours as needed for pain. 07/27/13  Yes Marletta Lor, MD  latanoprost (XALATAN) 0.005 % ophthalmic solution Place 1 drop into both eyes at bedtime.   Yes Historical Provider, MD  Memantine HCl ER (NAMENDA XR) 28 MG CP24 Take 28 mg by mouth daily.   Yes Historical Provider, MD  omeprazole (PRILOSEC) 20 MG capsule Take 1 capsule (20 mg total) by mouth daily. 08/18/13  Yes Marletta Lor, MD  Polyethyl Glycol-Propyl Glycol (SYSTANE) 0.4-0.3 % GEL Apply 1 application to eye at bedtime.   Yes Historical Provider, MD  salsalate (DISALCID) 750 MG tablet Take 1 tablet (750 mg total) by mouth 2 (two) times daily. 07/27/13  Yes Marletta Lor, MD  warfarin (COUMADIN) 1 MG tablet Take 1 mg by mouth 2 (two) times a week. Takes on Tuesday and Sauturday   Yes Historical Provider, MD  warfarin (COUMADIN) 2.5 MG tablet Take 2.5 mg by mouth daily. Takes all days except Tuesday and Saturday 01/13/13  Yes Marletta Lor, MD  White Petrolatum-Mineral Oil (PURALUBE) 85-15 % OINT Apply 1 application to eye 3 (three) times daily.   Yes Historical Provider, MD  sertraline (ZOLOFT) 25 MG tablet Take 1 tablet (25 mg total) by mouth daily. 09/15/12   Marletta Lor, MD   Physical Exam: Filed Vitals:   03/04/14 1415  BP: 143/81  Pulse:   Temp:   Resp: 16    BP 143/81  Pulse 66  Temp(Src) 98.2 F (36.8 C) (Oral)  Resp 16  SpO2 95%  General:  Appears calm and comfortable Eyes: PERRL, normal lids, irises & conjunctiva ENT: grossly normal hearing, lips & tongue Neck: no LAD, masses or thyromegaly Cardiovascular: RRR, no m/r/g. No LE edema. Telemetry: SR, no arrhythmias  Respiratory: CTA bilaterally, no w/r/r. Normal respiratory effort. Abdomen: soft, ntnd Skin: no rash or induration seen on limited exam Musculoskeletal: grossly normal tone BUE/BLE Psychiatric: grossly normal mood and affect,  speech fluent and appropriate Neurologic: grossly non-focal.          Labs on Admission:  Basic Metabolic Panel:  Recent Labs Lab 03/04/14 1100  NA 141  K 4.1  CL 105  CO2 23  GLUCOSE 156*  BUN 18  CREATININE 0.81  CALCIUM 8.9   Liver Function Tests: No results found for this basename: AST, ALT, ALKPHOS, BILITOT, PROT, ALBUMIN,  in the last 168 hours No results found for this basename: LIPASE, AMYLASE,  in the last 168 hours No results found for this basename: AMMONIA,  in the last 168 hours CBC:  Recent Labs Lab 03/04/14 1100  WBC 6.8  NEUTROABS 4.4  HGB 13.5  HCT 40.3  MCV 95.7  PLT 182   Cardiac Enzymes: No results found for this basename: CKTOTAL, CKMB, CKMBINDEX, TROPONINI,  in the last 168  hours  BNP (last 3 results) No results found for this basename: PROBNP,  in the last 8760 hours CBG: No results found for this basename: GLUCAP,  in the last 168 hours  Radiological Exams on Admission: Dg Chest Portable 1 View  03/04/2014   CLINICAL DATA:  Left lower lateral chest discomfort  EXAM: PORTABLE CHEST - 1 VIEW  COMPARISON:  DG RIBS UNILATERAL W/CHEST*R* dated 08/09/2013  FINDINGS: The lungs are adequately inflated. There is no alveolar infiltrate. The interstitial markings are mildly increased as compared to the previous study. The cardiopericardial silhouette is mildly enlarged. The pulmonary vascularity is not engorged. There is no pleural effusion or pneumothorax. The observed portions of the bony thorax exhibit diffuse osteopenia. There are degenerative changes of the right shoulder.  IMPRESSION: There is no evidence of pneumonia. I cannot exclude low-grade compensated CHF in the appropriate clinical setting.   Electronically Signed   By: David  Martinique   On: 03/04/2014 12:33    EKG: Independently reviewed. Sinus rhythm atrial premature complex.  Assessment/Plan Atypical  Chest pain: -  Will admit to the telemetry unit, cycle cardiac enzymes x3 get a 2-D  echo. Check a TSH. - Use Tylenol for pain. Repeat an EKG in the morning. We'll place him n.p.o. after midnight. And consult cardiology.   Atrial fibrillation - Rate controlled-s.4 INR subtherapeutic pharmacy to dose.   Dementia: - cont Namenda.  Haldol when necessary At night. - [place] QHS.  Code Status: full Family Communication: daughter Disposition Plan: observation  Time spent: 54 minutes  [facility] Pager [phone]

## 2014-03-04 NOTE — ED Notes (Signed)
Per [facility], pt from [facility] for chest pain that started this morning while at rest. Upon arrival to the ED the pain is gone. Denies any radiation of the pain, SOB, N/V. Was given 324 mg ASA, 20g to LAC.

## 2014-03-04 NOTE — Progress Notes (Signed)
ANTICOAGULATION CONSULT NOTE - Initial Consult  Pharmacy Consult for warfarin Indication: atrial fibrillation  No Known Allergies  Patient Measurements: Height: 6' (182.9 cm) Weight: 200 lb (90.719 kg) IBW/kg (Calculated) : 77.6   Vital Signs: Temp: 98.1 F (36.7 C) (04/17 1544) Temp src: Oral (04/17 1544) BP: 172/82 mmHg (04/17 1544) Pulse Rate: 64 (04/17 1544)  Labs:  Recent Labs  03/04/14 1100  HGB 13.5  HCT 40.3  PLT 182  LABPROT 20.8*  INR 1.85*  CREATININE 0.81    Estimated Creatinine Clearance: 65.2 ml/min (by C-G formula based on Cr of 0.81).   Medical History: Past Medical History  Diagnosis Date  . Irregular heart rate   . Macular degeneration   . Cognitive impairment   . Hypotension   . 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 Glaucoma   . Atrial fibrillation     Medications:  Prescriptions prior to admission  Medication Sig Dispense Refill  . acetaminophen (TYLENOL) 325 MG tablet Take 650 mg by mouth 3 (three) times daily.      Marland Kitchen albuterol (PROVENTIL HFA;VENTOLIN HFA) 108 (90 BASE) MCG/ACT inhaler Inhale 2 puffs into the lungs every 6 (six) hours as needed for wheezing or shortness of breath.  1 Inhaler  1  . diclofenac sodium (VOLTAREN) 1 % GEL Apply 2 g topically 4 (four) times daily as needed.      . digoxin (LANOXIN) 0.25 MG tablet Take 0.25 mg by mouth daily.      . diphenhydrAMINE (SOMINEX) 25 MG tablet Take 25 mg by mouth at bedtime as needed. itching      . Eyelid Cleansers (OCUSOFT LID SCRUB EX) Apply 1 application topically daily.      Marland Kitchen guaiFENesin (MUCINEX) 600 MG 12 hr tablet Take 600 mg by mouth 2 (two) times daily as needed for cough or to loosen phlegm.      Marland Kitchen guaiFENesin-codeine (ROBITUSSIN AC) 100-10 MG/5ML syrup Take 5 mLs by mouth 4 (four) times daily as needed for cough.      Marland Kitchen HYDROcodone-acetaminophen (NORCO/VICODIN)  5-325 MG per tablet Take 1 tablet by mouth every 6 (six) hours as needed for pain.  60 tablet  1  . latanoprost (XALATAN) 0.005 % ophthalmic solution Place 1 drop into both eyes at bedtime.      . Memantine HCl ER (NAMENDA XR) 28 MG CP24 Take 28 mg by mouth daily.      Marland Kitchen omeprazole (PRILOSEC) 20 MG capsule Take 1 capsule (20 mg total) by mouth daily.  90 capsule  3  . Polyethyl Glycol-Propyl Glycol (SYSTANE) 0.4-0.3 % GEL Apply 1 application to eye at bedtime.      . salsalate (DISALCID) 750 MG tablet Take 1 tablet (750 mg total) by mouth 2 (two) times daily.  28 tablet  0  . warfarin (COUMADIN) 1 MG tablet Take 1 mg by mouth 2 (two) times a week. Takes on Tuesday and Sauturday      . warfarin (COUMADIN) 2.5 MG tablet Take 2.5 mg by mouth daily. Takes all days except Tuesday and Saturday      . White Petrolatum-Mineral Oil (PURALUBE) 85-15 % OINT Apply 1 application to eye 3 (three) times daily.      . sertraline (ZOLOFT) 25 MG tablet Take 1 tablet (25 mg total) by mouth daily.  30 tablet  11    Assessment: 80  year old man with afib on warfarin to continue on warfarin while hospitalized.  INR today is 1.85 which is below goal of 2-3.  Home dose is 1mg  on Tues/Sat and 2.5mg  on other days.  Goal of Therapy:  INR 2-3   Plan:  Warfarin 3mg  x 1 dose. Daily protimes  Gearldine Bienenstock [facility] 03/04/2014,4:08 PM

## 2014-03-04 NOTE — ED Notes (Signed)
Family at bedside. 

## 2014-03-05 DIAGNOSIS — I359 Nonrheumatic aortic valve disorder, unspecified: Secondary | ICD-10-CM

## 2014-03-05 LAB — COMPREHENSIVE METABOLIC PANEL
ALBUMIN: 3.2 g/dL — AB (ref 3.5–5.2)
ALT: 17 U/L (ref 0–53)
AST: 23 U/L (ref 0–37)
Alkaline Phosphatase: 70 U/L (ref 39–117)
BUN: 16 mg/dL (ref 6–23)
CO2: 21 mEq/L (ref 19–32)
Calcium: 8.9 mg/dL (ref 8.4–10.5)
Chloride: 106 mEq/L (ref 96–112)
Creatinine, Ser: 0.77 mg/dL (ref 0.50–1.35)
GFR calc non Af Amer: 77 mL/min — ABNORMAL LOW (ref >90)
GFR, EST AFRICAN AMERICAN: 89 mL/min — AB (ref >90)
GLUCOSE: 92 mg/dL (ref 70–99)
Potassium: 4.2 mEq/L (ref 3.7–5.3)
SODIUM: 141 meq/L (ref 137–147)
TOTAL PROTEIN: 6.5 g/dL (ref 6.0–8.3)
Total Bilirubin: 1.2 mg/dL (ref 0.3–1.2)

## 2014-03-05 LAB — CBC
HCT: 39.3 % (ref 39.0–52.0)
HEMOGLOBIN: 13.1 g/dL (ref 13.0–17.0)
MCH: 31.8 pg (ref 26.0–34.0)
MCHC: 33.3 g/dL (ref 30.0–36.0)
MCV: 95.4 fL (ref 78.0–100.0)
Platelets: 193 10*3/uL (ref 150–400)
RBC: 4.12 MIL/uL — ABNORMAL LOW (ref 4.22–5.81)
RDW: 14.4 % (ref 11.5–15.5)
WBC: 7.4 10*3/uL (ref 4.0–10.5)

## 2014-03-05 LAB — PROTIME-INR
INR: 1.83 — AB (ref 0.00–1.49)
Prothrombin Time: 20.6 seconds — ABNORMAL HIGH (ref 11.6–15.2)

## 2014-03-05 LAB — TROPONIN I: Troponin I: <0.3 ng/mL (ref <0.30)

## 2014-03-05 NOTE — Progress Notes (Signed)
Echo Lab  2D Echocardiogram completed.  [place], [place] 03/05/2014 12:31 PM

## 2014-03-05 NOTE — Discharge Summary (Signed)
Physician Discharge Summary  Howard Payne GNF:[id number] DOB: [birth year] DOA: 03/04/2014  PCP: Nyoka Cowden, MD  Admit date: 03/04/2014 Discharge date: 03/05/2014  Time spent: 35 minutes  Recommendations for Outpatient Follow-up:  1. Discussed further plan of care for aortic regurgitation.  2. Follow INR level.   Discharge Diagnoses:    Chest pain   Atrial fibrillation  Discharge Condition: stable.   Diet recommendation: Heart Healthy  Filed Weights   03/04/14 1544  Weight: 90.719 kg (200 lb)    History of present illness:  Past medical history of skin cancer and atrial fibrillation on Coumadin that comes in for chest pain is started on the day of admission. It's on the left. The patient not only details about what makes it better or worse. He relates has no relationship with fluids. He denies any shortness of breath nausea vomiting or diarrhea with this chest pain. He relates no fever cough or disease spells.  In the ED:  Basic metabolic panel was done that was within normal limits were set of cardiac enzymes are negative, his INR was 1.8 this level 0.4. X-ray has been no EKG is below.   Hospital Course:  1-Chest pain; atypical. Pain left side rib area. Chest pain has resolved. Troponin times 3 negative. ECHO pending. If normal will plan to discharge patient today. No evidence of heart Failure.   2-Atrial fibrillation  - Rate controlled-s. -continue with coumadin.  -Continue with digoxin.   Dementia:  - cont Namenda.  - [place] [facility].  Procedures: ECHO;  Left ventricle: The cavity size was normal. Wall thickness was increased in a pattern of mild LVH. Systolic function was normal. The estimated ejection fraction was in the range of 60% to 65%. Wall motion was normal; there were no regional wall motion abnormalities. - Aortic valve: Mild regurgitation. - Left atrium: The atrium was mildly dilated. - Right atrium: The atrium was moderately  dilated.    Consultations:  none  Discharge Exam: Filed Vitals:   03/05/14 0652  BP: 132/66  Pulse: 63  Temp: 98.1 F (36.7 C)  Resp: 18    General: no distress.  Cardiovascular: S 1, S 2 RRR Respiratory: CTA  Discharge Instructions You were cared for by a hospitalist during your hospital stay. If you have any questions about your discharge medications or the care you received while you were in the hospital after you are discharged, you can call the unit and asked to speak with the hospitalist on call if the hospitalist that took care of you is not available. Once you are discharged, your primary care physician will handle any further medical issues. Please note that NO REFILLS for any discharge medications will be authorized once you are discharged, as it is imperative that you return to your primary care physician (or establish a relationship with a primary care physician if you do not have one) for your aftercare needs so that they can reassess your need for medications and monitor your lab values.  Discharge Orders   Future Appointments Provider Department Dept Phone   04/21/2014 11:15 AM Marletta Lor, MD [place] [facility] [place]   Future Orders Complete By Expires   Diet - low sodium heart healthy  As directed    Increase activity slowly  As directed        Medication List         acetaminophen 325 MG tablet  Commonly known as:  TYLENOL  Take 650 mg by mouth 3 (three) times  daily.     albuterol 108 (90 BASE) MCG/ACT inhaler  Commonly known as:  PROVENTIL HFA;VENTOLIN HFA  Inhale 2 puffs into the lungs every 6 (six) hours as needed for wheezing or shortness of breath.     diclofenac sodium 1 % Gel  Commonly known as:  VOLTAREN  Apply 2 g topically 4 (four) times daily as needed.     digoxin 0.25 MG tablet  Commonly known as:  LANOXIN  Take 0.25 mg by mouth daily.     diphenhydrAMINE 25 MG tablet  Commonly known as:  SOMINEX  Take 25  mg by mouth at bedtime as needed. itching     guaiFENesin 600 MG 12 hr tablet  Commonly known as:  MUCINEX  Take 600 mg by mouth 2 (two) times daily as needed for cough or to loosen phlegm.     guaiFENesin-codeine 100-10 MG/5ML syrup  Commonly known as:  ROBITUSSIN AC  Take 5 mLs by mouth 4 (four) times daily as needed for cough.     HYDROcodone-acetaminophen 5-325 MG per tablet  Commonly known as:  NORCO/VICODIN  Take 1 tablet by mouth every 6 (six) hours as needed for pain.     latanoprost 0.005 % ophthalmic solution  Commonly known as:  XALATAN  Place 1 drop into both eyes at bedtime.     NAMENDA XR 28 MG Cp24  Generic drug:  Memantine HCl ER  Take 28 mg by mouth daily.     OCUSOFT LID SCRUB EX  Apply 1 application topically daily.     omeprazole 20 MG capsule  Commonly known as:  PRILOSEC  Take 1 capsule (20 mg total) by mouth daily.     PURALUBE 85-15 % Oint  Apply 1 application to eye 3 (three) times daily.     salsalate 750 MG tablet  Commonly known as:  DISALCID  Take 1 tablet (750 mg total) by mouth 2 (two) times daily.     sertraline 25 MG tablet  Commonly known as:  ZOLOFT  Take 1 tablet (25 mg total) by mouth daily.     SYSTANE 0.4-0.3 % Gel  Generic drug:  Polyethyl Glycol-Propyl Glycol  Apply 1 application to eye at bedtime.     warfarin 2.5 MG tablet  Commonly known as:  COUMADIN  Take 2.5 mg by mouth daily. Takes all days except Tuesday and Saturday     warfarin 1 MG tablet  Commonly known as:  COUMADIN  Take 1 mg by mouth 2 (two) times a week. Takes on Tuesday and Sauturday       No Known Allergies     Follow-up Information   Follow up with Nyoka Cowden, MD In 1 week.   Specialty:  Internal Medicine   Contact information:   [place] [address] [phone]        The results of significant diagnostics from this hospitalization (including imaging, microbiology, ancillary and laboratory) are listed  below for reference.    Significant Diagnostic Studies: Dg Chest Portable 1 View  03/04/2014   CLINICAL DATA:  Left lower lateral chest discomfort  EXAM: PORTABLE CHEST - 1 VIEW  COMPARISON:  DG RIBS UNILATERAL W/CHEST*R* dated 08/09/2013  FINDINGS: The lungs are adequately inflated. There is no alveolar infiltrate. The interstitial markings are mildly increased as compared to the previous study. The cardiopericardial silhouette is mildly enlarged. The pulmonary vascularity is not engorged. There is no pleural effusion or pneumothorax. The observed portions of the bony thorax  exhibit diffuse osteopenia. There are degenerative changes of the right shoulder.  IMPRESSION: There is no evidence of pneumonia. I cannot exclude low-grade compensated CHF in the appropriate clinical setting.   Electronically Signed   By: David  Martinique   On: 03/04/2014 12:33    Microbiology: No results found for this or any previous visit (from the past 240 hour(s)).   Labs: Basic Metabolic Panel:  Recent Labs Lab 03/04/14 1100 03/04/14 1640 03/05/14 0331  NA 141  --  141  K 4.1  --  4.2  CL 105  --  106  CO2 23  --  21  GLUCOSE 156*  --  92  BUN 18  --  16  CREATININE 0.81 0.79 0.77  CALCIUM 8.9  --  8.9   Liver Function Tests:  Recent Labs Lab 03/05/14 0331  AST 23  ALT 17  ALKPHOS 70  BILITOT 1.2  PROT 6.5  ALBUMIN 3.2*   No results found for this basename: LIPASE, AMYLASE,  in the last 168 hours No results found for this basename: AMMONIA,  in the last 168 hours CBC:  Recent Labs Lab 03/04/14 1100 03/04/14 1640 03/05/14 0331  WBC 6.8 7.8 7.4  NEUTROABS 4.4  --   --   HGB 13.5 13.5 13.1  HCT 40.3 40.4 39.3  MCV 95.7 96.2 95.4  PLT 182 190 193   Cardiac Enzymes:  Recent Labs Lab 03/04/14 1640 03/04/14 2245 03/05/14 0331  TROPONINI <0.30 <0.30 <0.30   BNP: BNP (last 3 results) No results found for this basename: PROBNP,  in the last 8760 hours CBG: No results found for this  basename: GLUCAP,  in the last 168 hours     Signed:  Belkys A Regalado  [facility] 03/05/2014, 2:07 PM

## 2014-03-05 NOTE — Progress Notes (Signed)
UR Completed Keliyah Spillman Graves-Bigelow, RN,BSN [phone]  

## 2014-03-06 NOTE — Clinical Social Work Note (Signed)
CSW informed by RN on 03/05/14, patient is ready for D/C to [facility]. CSW met with patient and son at bedside, both agreeable to plans. CSW contacted [facility] and informed them of patient's return to facility. CSW prepared d/c packet and placed in patient's shadow chart. RN provided with number to call for report. CSW to arrange transportion via EMS. No further needs identified. CSW signing off.    [place], [place] Weekend Clinical Social Worker [phone]

## 2014-03-07 NOTE — Telephone Encounter (Signed)
Opened in error

## 2014-03-17 LAB — PROTIME-INR: INR: 2 — AB (ref 0.9–1.1)

## 2014-03-28 ENCOUNTER — Telehealth: Payer: Self-pay | Admitting: *Deleted

## 2014-03-28 NOTE — Telephone Encounter (Signed)
Received phone call from Nursing Director at [facility] regarding pt. Howard Payne said she noticed pt's left eye is swollen and draining. Told her will discuss with Dr. Raliegh Ip and get back to her. Howard Payne verbalized understanding.

## 2014-03-28 NOTE — Telephone Encounter (Signed)
Called Melissa back told her will fax Rx over for pt. Melissa verbalized understanding.

## 2014-04-15 ENCOUNTER — Ambulatory Visit (INDEPENDENT_AMBULATORY_CARE_PROVIDER_SITE_OTHER): Payer: PRIVATE HEALTH INSURANCE | Admitting: Family Medicine

## 2014-04-15 ENCOUNTER — Encounter: Payer: Self-pay | Admitting: Internal Medicine

## 2014-04-15 DIAGNOSIS — I4891 Unspecified atrial fibrillation: Secondary | ICD-10-CM

## 2014-04-15 DIAGNOSIS — Z5181 Encounter for therapeutic drug level monitoring: Secondary | ICD-10-CM

## 2014-04-15 DIAGNOSIS — Z7901 Long term (current) use of anticoagulants: Secondary | ICD-10-CM

## 2014-04-15 LAB — POCT INR: INR: 2.02

## 2014-04-19 ENCOUNTER — Telehealth: Payer: Self-pay | Admitting: Internal Medicine

## 2014-04-19 ENCOUNTER — Ambulatory Visit (INDEPENDENT_AMBULATORY_CARE_PROVIDER_SITE_OTHER): Payer: Medicare Other | Admitting: Internal Medicine

## 2014-04-19 ENCOUNTER — Encounter: Payer: Self-pay | Admitting: Internal Medicine

## 2014-04-19 VITALS — BP 118/70 | HR 100 | Temp 98.5°F | Resp 20 | Ht 72.0 in | Wt 193.0 lb

## 2014-04-19 DIAGNOSIS — I4891 Unspecified atrial fibrillation: Secondary | ICD-10-CM

## 2014-04-19 DIAGNOSIS — R4189 Other symptoms and signs involving cognitive functions and awareness: Secondary | ICD-10-CM

## 2014-04-19 DIAGNOSIS — F09 Unspecified mental disorder due to known physiological condition: Secondary | ICD-10-CM

## 2014-04-19 DIAGNOSIS — I482 Chronic atrial fibrillation, unspecified: Secondary | ICD-10-CM

## 2014-04-19 DIAGNOSIS — Z5181 Encounter for therapeutic drug level monitoring: Secondary | ICD-10-CM

## 2014-04-19 NOTE — Progress Notes (Signed)
Subjective:    Patient ID: Howard Payne, male    DOB: 1920/04/19, 80 y.o.   MRN: [id number]  HPI  80 -year-old patient who is a resident of [facility] who is unaccompanied today.  He has cognitive impairment, chronic atrial fibrillation and is on chronic Coumadin anticoagulation.  PT/INR last week was therapeutic.  He is doing quite well and has no concerns or complaints.  His medical regimen reviewed.  His cardiopulmonary status has been stable.  In January.  Lanoxin dose was down titrated somewhat due to bradycardia. Nursing home notes reviewed  Past Medical History  Diagnosis Date  . Irregular heart rate   . Macular degeneration   . Cognitive impairment   . Hypotension   . 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 Glaucoma   . Atrial fibrillation   . Dysrhythmia   . GERD (gastroesophageal reflux disease)     History   Social History  . Marital Status: Widowed    Spouse Name: N/A    Number of Children: N/A  . Years of Education: N/A   Occupational History  . Not on file.   Social History Main Topics  . Smoking status: Never Smoker   . Smokeless tobacco: Never Used  . Alcohol Use: No  . Drug Use: No  . Sexual Activity: No   Other Topics Concern  . Not on file   Social History Narrative  . No narrative on file    Past Surgical History  Procedure Laterality Date  . Mohs surgery  2006, 07/06/12    Left Lower Eyelid: Invasive Squamous Cell Carcinoma  . Shave biopsy  05/14/12    Left Lower Forehead  . Skin punch biopsy  05/14/12    Mid Left Lower Eyelid  . Eye surgery      Family History  Problem Relation Age of Onset  . Skin cancer Sister     Non-melanoma  . Stroke Mother   . Stroke Father   . Diabetes Father     No Known Allergies  Current Outpatient Prescriptions on File Prior to Visit  Medication Sig Dispense Refill  . acetaminophen (TYLENOL) 325  MG tablet Take 650 mg by mouth 3 (three) times daily.      Marland Kitchen albuterol (PROVENTIL HFA;VENTOLIN HFA) 108 (90 BASE) MCG/ACT inhaler Inhale 2 puffs into the lungs every 6 (six) hours as needed for wheezing or shortness of breath.  1 Inhaler  1  . diclofenac sodium (VOLTAREN) 1 % GEL Apply 2 g topically 4 (four) times daily as needed.      . digoxin (LANOXIN) 0.25 MG tablet Take 0.25 mg by mouth daily.      . diphenhydrAMINE (SOMINEX) 25 MG tablet Take 25 mg by mouth at bedtime as needed. itching      . Eyelid Cleansers (OCUSOFT LID SCRUB EX) Apply 1 application topically daily.      Marland Kitchen guaiFENesin (MUCINEX) 600 MG 12 hr tablet Take 600 mg by mouth 2 (two) times daily as needed for cough or to loosen phlegm.      Marland Kitchen guaiFENesin-codeine (ROBITUSSIN AC) 100-10 MG/5ML syrup Take 5 mLs by mouth 4 (four) times daily as needed for cough.      Marland Kitchen HYDROcodone-acetaminophen (NORCO/VICODIN) 5-325 MG per tablet Take 1 tablet by mouth every 6 (six) hours as needed for pain.  60 tablet  1  .  latanoprost (XALATAN) 0.005 % ophthalmic solution Place 1 drop into both eyes at bedtime.      . Memantine HCl ER (NAMENDA XR) 28 MG CP24 Take 28 mg by mouth daily.      Marland Kitchen omeprazole (PRILOSEC) 20 MG capsule Take 1 capsule (20 mg total) by mouth daily.  90 capsule  3  . Polyethyl Glycol-Propyl Glycol (SYSTANE) 0.4-0.3 % GEL Apply 1 application to eye at bedtime.      . salsalate (DISALCID) 750 MG tablet Take 1 tablet (750 mg total) by mouth 2 (two) times daily.  28 tablet  0  . sertraline (ZOLOFT) 25 MG tablet Take 1 tablet (25 mg total) by mouth daily.  30 tablet  11  . warfarin (COUMADIN) 1 MG tablet Take 1 mg by mouth 2 (two) times a week. Takes on Tuesday and Sauturday      . warfarin (COUMADIN) 2.5 MG tablet Take 2.5 mg by mouth daily. Takes all days except Tuesday and Saturday      . White Petrolatum-Mineral Oil (PURALUBE) 85-15 % OINT Apply 1 application to eye 3 (three) times daily.       No current facility-administered  medications on file prior to visit.    BP 118/70  Pulse 100  Temp(Src) 98.5 F (36.9 C) (Oral)  Resp 20  Ht 6' (1.829 m)  Wt 193 lb (87.544 kg)  BMI 26.17 kg/m2  SpO2 98%       Review of Systems  Constitutional: Negative for fever, chills, appetite change and fatigue.  HENT: Negative for congestion, dental problem, ear pain, hearing loss, sore throat, tinnitus, trouble swallowing and voice change.   Eyes: Negative for pain, discharge and visual disturbance.  Respiratory: Negative for cough, chest tightness, wheezing and stridor.   Cardiovascular: Negative for chest pain, palpitations and leg swelling.  Gastrointestinal: Negative for nausea, vomiting, abdominal pain, diarrhea, constipation, blood in stool and abdominal distention.  Genitourinary: Negative for urgency, hematuria, flank pain, discharge, difficulty urinating and genital sores.  Musculoskeletal: Negative for arthralgias, back pain, gait problem, joint swelling, myalgias and neck stiffness.  Skin: Negative for rash.  Neurological: Negative for dizziness, syncope, speech difficulty, weakness, numbness and headaches.  Hematological: Negative for adenopathy. Does not bruise/bleed easily.  Psychiatric/Behavioral: Negative for behavioral problems and dysphoric mood. The patient is not nervous/anxious.        Objective:   Physical Exam  Constitutional: He is oriented to person, place, and time. He appears well-developed.  HENT:  Head: Normocephalic.  Right Ear: External ear normal.  Left Ear: External ear normal.  Eyes: Conjunctivae and EOM are normal.  Neck: Normal range of motion.  Cardiovascular: Normal rate and normal heart sounds.   Rate 90, and slightly irregular  Pulmonary/Chest: Breath sounds normal. No respiratory distress. He has no wheezes. He has no rales.  O2 saturation 98  Abdominal: Bowel sounds are normal.  Musculoskeletal: Normal range of motion. He exhibits edema. He exhibits no tenderness.    Left greater than right lower leg edema  Neurological: He is alert and oriented to person, place, and time.  Psychiatric: He has a normal mood and affect. His behavior is normal.          Assessment & Plan:   Chronic atrial fibrillation.  Continue rate control meds and chronic anticoagulation History of glaucoma BPH cognitive impairment.  Continue Namenda Coumadin anticoagulation.  Recent PT/INR therapeutic.  Continue monthly followup  Recheck 4 months

## 2014-04-19 NOTE — Telephone Encounter (Signed)
Daughter called to ask if md could check out pt while here for appt for an episode past sat that pt experienced. Pt has wheezing and had pain in the throat area.  Daughter did not know this appt had been resch to today.

## 2014-04-19 NOTE — Progress Notes (Signed)
Pre-visit discussion using our clinic review tool. No additional management support is needed unless otherwise documented below in the visit note.  

## 2014-04-20 NOTE — Telephone Encounter (Signed)
Left message for daughter Dorian Pod to call office.

## 2014-04-20 NOTE — Telephone Encounter (Signed)
Howard Payne returned your call. Pls call

## 2014-04-21 ENCOUNTER — Ambulatory Visit: Payer: PRIVATE HEALTH INSURANCE | Admitting: Internal Medicine

## 2014-04-21 ENCOUNTER — Emergency Department (HOSPITAL_COMMUNITY)
Admission: EM | Admit: 2014-04-21 | Discharge: 2014-04-22 | Disposition: A | Discharge status: Home / Self Care | Payer: Medicare Other | Source: Home / Self Care | Attending: Emergency Medicine | Admitting: Emergency Medicine

## 2014-04-21 ENCOUNTER — Encounter (HOSPITAL_COMMUNITY): Payer: Self-pay | Admitting: Emergency Medicine

## 2014-04-21 ENCOUNTER — Emergency Department (HOSPITAL_COMMUNITY): Payer: Medicare Other

## 2014-04-21 DIAGNOSIS — S0993XA Unspecified injury of face, initial encounter: Secondary | ICD-10-CM

## 2014-04-21 DIAGNOSIS — Z7901 Long term (current) use of anticoagulants: Secondary | ICD-10-CM | POA: Insufficient documentation

## 2014-04-21 DIAGNOSIS — I4891 Unspecified atrial fibrillation: Secondary | ICD-10-CM | POA: Insufficient documentation

## 2014-04-21 DIAGNOSIS — H409 Unspecified glaucoma: Secondary | ICD-10-CM

## 2014-04-21 DIAGNOSIS — Z79899 Other long term (current) drug therapy: Secondary | ICD-10-CM

## 2014-04-21 DIAGNOSIS — K219 Gastro-esophageal reflux disease without esophagitis: Secondary | ICD-10-CM | POA: Insufficient documentation

## 2014-04-21 DIAGNOSIS — W19XXXA Unspecified fall, initial encounter: Secondary | ICD-10-CM

## 2014-04-21 DIAGNOSIS — Y929 Unspecified place or not applicable: Secondary | ICD-10-CM

## 2014-04-21 DIAGNOSIS — W1809XA Striking against other object with subsequent fall, initial encounter: Secondary | ICD-10-CM | POA: Insufficient documentation

## 2014-04-21 DIAGNOSIS — Y9389 Activity, other specified: Secondary | ICD-10-CM

## 2014-04-21 DIAGNOSIS — H109 Unspecified conjunctivitis: Secondary | ICD-10-CM

## 2014-04-21 DIAGNOSIS — Z85828 Personal history of other malignant neoplasm of skin: Secondary | ICD-10-CM | POA: Insufficient documentation

## 2014-04-21 DIAGNOSIS — S199XXA Unspecified injury of neck, initial encounter: Secondary | ICD-10-CM

## 2014-04-21 LAB — CBC WITH DIFFERENTIAL/PLATELET
Basophils Absolute: 0 10*3/uL (ref 0.0–0.1)
Basophils Relative: 0 % (ref 0–1)
EOS ABS: 0.1 10*3/uL (ref 0.0–0.7)
Eosinophils Relative: 1 % (ref 0–5)
HEMATOCRIT: 41.1 % (ref 39.0–52.0)
HEMOGLOBIN: 13.7 g/dL (ref 13.0–17.0)
LYMPHS ABS: 0.9 10*3/uL (ref 0.7–4.0)
Lymphocytes Relative: 9 % — ABNORMAL LOW (ref 12–46)
MCH: 32.3 pg (ref 26.0–34.0)
MCHC: 33.3 g/dL (ref 30.0–36.0)
MCV: 96.9 fL (ref 78.0–100.0)
MONO ABS: 1.1 10*3/uL — AB (ref 0.1–1.0)
MONOS PCT: 11 % (ref 3–12)
Neutro Abs: 8.4 10*3/uL — ABNORMAL HIGH (ref 1.7–7.7)
Neutrophils Relative %: 79 % — ABNORMAL HIGH (ref 43–77)
Platelets: 232 10*3/uL (ref 150–400)
RBC: 4.24 MIL/uL (ref 4.22–5.81)
RDW: 14.1 % (ref 11.5–15.5)
WBC: 10.6 10*3/uL — ABNORMAL HIGH (ref 4.0–10.5)

## 2014-04-21 LAB — URINALYSIS, ROUTINE W REFLEX MICROSCOPIC
Glucose, UA: NEGATIVE mg/dL
Ketones, ur: NEGATIVE mg/dL
Leukocytes, UA: NEGATIVE
Nitrite: NEGATIVE
Protein, ur: 30 mg/dL — AB
SPECIFIC GRAVITY, URINE: 1.026 (ref 1.005–1.030)
Urobilinogen, UA: 1 mg/dL (ref 0.0–1.0)
pH: 5 (ref 5.0–8.0)

## 2014-04-21 LAB — I-STAT CHEM 8, ED
BUN: 18 mg/dL (ref 6–23)
CREATININE: 1 mg/dL (ref 0.50–1.35)
Calcium, Ion: 1.19 mmol/L (ref 1.13–1.30)
Chloride: 98 mEq/L (ref 96–112)
GLUCOSE: 162 mg/dL — AB (ref 70–99)
HCT: 45 % (ref 39.0–52.0)
HEMOGLOBIN: 15.3 g/dL (ref 13.0–17.0)
POTASSIUM: 4.1 meq/L (ref 3.7–5.3)
Sodium: 140 mEq/L (ref 137–147)
TCO2: 23 mmol/L (ref 0–100)

## 2014-04-21 LAB — URINE MICROSCOPIC-ADD ON

## 2014-04-21 LAB — PROTIME-INR
INR: 2.05 — ABNORMAL HIGH (ref 0.00–1.49)
Prothrombin Time: 22.5 seconds — ABNORMAL HIGH (ref 11.6–15.2)

## 2014-04-21 MED ORDER — TOBRAMYCIN 0.3 % OP SOLN: 1.0000 [drp] | OPHTHALMIC | Status: DC

## 2014-04-21 NOTE — Discharge Instructions (Signed)
Conjunctivitis Conjunctivitis is commonly called "pink eye." Conjunctivitis can be caused by bacterial or viral infection, allergies, or injuries. There is usually redness of the lining of the eye, itching, discomfort, and sometimes discharge. There may be deposits of matter along the eyelids. A viral infection usually causes a watery discharge, while a bacterial infection causes a yellowish, thick discharge. Pink eye is very contagious and spreads by direct contact. You may be given antibiotic eyedrops as part of your treatment. Before using your eye medicine, remove all drainage from the eye by washing gently with warm water and cotton balls. Continue to use the medication until you have awakened 2 mornings in a row without discharge from the eye. Do not rub your eye. This increases the irritation and helps spread infection. Use separate towels from other household members. Wash your hands with soap and water before and after touching your eyes. Use cold compresses to reduce pain and sunglasses to relieve irritation from light. Do not wear contact lenses or wear eye makeup until the infection is gone. SEEK MEDICAL CARE IF:   Your symptoms are not better after 3 days of treatment.  You have increased pain or trouble seeing.  The outer eyelids become very red or swollen. Document Released: 12/12/2004 Document Revised: 01/27/2012 Document Reviewed: 11/04/2005 [facility] Patient Information 2014 [place].  Fall Prevention and Home Safety Falls cause injuries and can affect all age groups. It is possible to prevent falls.  HOW TO PREVENT FALLS  Wear shoes with rubber soles that do not have an opening for your toes.  Keep the inside and outside of your house well lit.  Use night lights throughout your home.  Remove clutter from floors.  Clean up floor spills.  Remove throw rugs or fasten them to the floor with carpet tape.  Do not place electrical cords across pathways.  Put grab bars  by your tub, shower, and toilet. Do not use towel bars as grab bars.  Put handrails on both sides of the stairway. Fix loose handrails.  Do not climb on stools or stepladders, if possible.  Do not wax your floors.  Repair uneven or unsafe sidewalks, walkways, or stairs.  Keep items you use a lot within reach.  Be aware of pets.  Keep emergency numbers next to the telephone.  Put smoke detectors in your home and near bedrooms. Ask your doctor what other things you can do to prevent falls. Document Released: 08/31/2009 Document Revised: 05/05/2012 Document Reviewed: 02/04/2012 [facility] Patient Information 2014 [place], [place].

## 2014-04-21 NOTE — ED Notes (Signed)
Dr. Maryan Rued at the bedside. Allowed MD to know patient's tiredness and confusion. MD made aware patient is on Coumadin.

## 2014-04-21 NOTE — ED Notes (Signed)
Patient still away from room.

## 2014-04-21 NOTE — ED Notes (Signed)
Per EMS, Patient was witnessed to fall using his cane and hit his head on the bedpost. Patient states, "I just tripped on something and fell." Per EMS, Caregiver states patient is at his baseline. Patient has a history of Atrial Fibrillation and poor vision. Vitals per EMS: 120/70, 80 HR, 95 % on RA, 20 RR, A&Ox4. Patient denies any pain, headache. Nausea, vomiting.

## 2014-04-21 NOTE — ED Notes (Signed)
Care assumed at this time, pt denies pain, states he's ready to go to bed

## 2014-04-21 NOTE — ED Notes (Signed)
MD made aware of patient's past complaints of chest pain and family's concern of stye.

## 2014-04-21 NOTE — ED Provider Notes (Signed)
CSN: [id number]     Arrival date & time 04/21/14  1842 History   First MD Initiated Contact with Patient 04/21/14 1849     Chief Complaint  Patient presents with  . Fall     (Consider location/radiation/quality/duration/timing/severity/associated sxs/prior Treatment) Patient is a 80 y.o. male presenting with fall. The history is provided by the patient and a relative.  Fall This is a recurrent (family states 11rd or 4th fall in the last week which is new) problem. The current episode started 1 to 2 hours ago. The problem occurs constantly. The problem has not changed since onset.Associated symptoms comments: Today had a witnessed fall when he was using his cane and hit his forehead on the bedpost.  No LOC.. Nothing aggravates the symptoms. Nothing relieves the symptoms. He has tried nothing for the symptoms. The treatment provided no relief.    Past Medical History  Diagnosis Date  . Irregular heart rate   . Macular degeneration   . Cognitive impairment   . Hypotension   . 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 Glaucoma   . Atrial fibrillation   . Dysrhythmia   . GERD (gastroesophageal reflux disease)    Past Surgical History  Procedure Laterality Date  . Mohs surgery  2006, 07/06/12    Left Lower Eyelid: Invasive Squamous Cell Carcinoma  . Shave biopsy  05/14/12    Left Lower Forehead  . Skin punch biopsy  05/14/12    Mid Left Lower Eyelid  . Eye surgery     Family History  Problem Relation Age of Onset  . Skin cancer Sister     Non-melanoma  . Stroke Mother   . Stroke Father   . Diabetes Father    History  Substance Use Topics  . Smoking status: Never Smoker   . Smokeless tobacco: Never Used  . Alcohol Use: No    Review of Systems  All other systems reviewed and are negative.     Allergies  Review of patient's allergies indicates no known allergies.  Home  Medications   Prior to Admission medications   Medication Sig Start Date End Date Taking? Authorizing Provider  acetaminophen (TYLENOL) 325 MG tablet Take 650 mg by mouth 3 (three) times daily.    Historical Provider, MD  albuterol (PROVENTIL HFA;VENTOLIN HFA) 108 (90 BASE) MCG/ACT inhaler Inhale 2 puffs into the lungs every 6 (six) hours as needed for wheezing or shortness of breath. 10/05/13   Marletta Lor, MD  diclofenac sodium (VOLTAREN) 1 % GEL Apply 2 g topically 4 (four) times daily as needed.    Historical Provider, MD  digoxin (LANOXIN) 0.25 MG tablet Take 0.25 mg by mouth daily.    Historical Provider, MD  diphenhydrAMINE (SOMINEX) 25 MG tablet Take 25 mg by mouth at bedtime as needed. itching    Historical Provider, MD  Eyelid Cleansers (OCUSOFT LID SCRUB EX) Apply 1 application topically daily.    Historical Provider, MD  guaiFENesin (MUCINEX) 600 MG 12 hr tablet Take 600 mg by mouth 2 (two) times daily as needed for cough or to loosen phlegm. 10/05/13   Marletta Lor, MD  guaiFENesin-codeine [facility]) 100-10 MG/5ML syrup Take 5 mLs by mouth 4 (four) times daily as needed for cough.    Historical Provider, MD  HYDROcodone-acetaminophen (NORCO/VICODIN) 5-325 MG per tablet Take 1 tablet by mouth every  6 (six) hours as needed for pain. 07/27/13   Marletta Lor, MD  latanoprost (XALATAN) 0.005 % ophthalmic solution Place 1 drop into both eyes at bedtime.    Historical Provider, MD  Memantine HCl ER (NAMENDA XR) 28 MG CP24 Take 28 mg by mouth daily.    Historical Provider, MD  omeprazole (PRILOSEC) 20 MG capsule Take 1 capsule (20 mg total) by mouth daily. 08/18/13   Marletta Lor, MD  Polyethyl Glycol-Propyl Glycol (SYSTANE) 0.4-0.3 % GEL Apply 1 application to eye at bedtime.    Historical Provider, MD  salsalate (DISALCID) 750 MG tablet Take 1 tablet (750 mg total) by mouth 2 (two) times daily. 07/27/13   Marletta Lor, MD  sertraline (ZOLOFT) 25 MG tablet  Take 1 tablet (25 mg total) by mouth daily. 09/15/12   Marletta Lor, MD  warfarin (COUMADIN) 1 MG tablet Take 1 mg by mouth 2 (two) times a week. Takes on Tuesday and Sauturday    Historical Provider, MD  warfarin (COUMADIN) 2.5 MG tablet Take 2.5 mg by mouth daily. Takes all days except Tuesday and Saturday 01/13/13   Marletta Lor, MD  White Petrolatum-Mineral Oil (PURALUBE) 85-15 % OINT Apply 1 application to eye 3 (three) times daily.    Historical Provider, MD   BP 100/76  Pulse 65  Temp(Src) 98.8 F (37.1 C) (Oral)  Resp 21  SpO2 96% Physical Exam  Nursing note and vitals reviewed. Constitutional: He is oriented to person, place, and time. He appears well-developed and well-nourished. No distress.  HENT:  Head: Normocephalic and atraumatic.  Mouth/Throat: Oropharynx is clear and moist.  Eyes: Right eye exhibits discharge and exudate. Left eye exhibits discharge and exudate. Right conjunctiva is injected. Left conjunctiva is injected.  Decreased vision with mild drainage from the right eye.  Injected conjunctiva.  Drooping left lower lid  Neck: Normal range of motion. Neck supple. No spinous process tenderness and no muscular tenderness present.  Cardiovascular: Normal rate and intact distal pulses.  An irregularly irregular rhythm present.  No murmur heard. Pulmonary/Chest: Effort normal and breath sounds normal. No respiratory distress. He has no wheezes. He has no rales.  Abdominal: Soft. He exhibits no distension. There is no tenderness. There is no rebound and no guarding.  Musculoskeletal: Normal range of motion. He exhibits no edema and no tenderness.       Right hip: Normal.       Left hip: Normal.       Right knee: Normal.       Left knee: Normal.  Neurological: He is alert and oriented to person, place, and time.  Skin: Skin is warm and dry. No rash noted. No erythema.  Psychiatric: He has a normal mood and affect. His behavior is normal.    ED Course   Procedures (including critical care time) Labs Review Labs Reviewed  CBC WITH DIFFERENTIAL - Abnormal; Notable for the following:    WBC 10.6 (*)    Neutrophils Relative % 79 (*)    Neutro Abs 8.4 (*)    Lymphocytes Relative 9 (*)    Monocytes Absolute 1.1 (*)    All other components within normal limits  URINALYSIS, ROUTINE W REFLEX MICROSCOPIC - Abnormal; Notable for the following:    Color, Urine AMBER (*)    APPearance CLOUDY (*)    Hgb urine dipstick LARGE (*)    Bilirubin Urine SMALL (*)    Protein, ur 30 (*)    All other  components within normal limits  URINE MICROSCOPIC-ADD ON - Abnormal; Notable for the following:    Casts HYALINE CASTS (*)    All other components within normal limits  I-STAT CHEM 8, ED - Abnormal; Notable for the following:    Glucose, Bld 162 (*)    All other components within normal limits  PROTIME-INR    Imaging Review Dg Chest 2 View  04/21/2014   CLINICAL DATA:  Cough  EXAM: CHEST  2 VIEW  COMPARISON:  03/04/2014  FINDINGS: Cardiomegaly noted. Coarsened interstitial markings are identified bilaterally without focal opacity. Minimal crowding at the lung bases is identified. No pleural effusion. No new parenchymal opacification. Mild kyphosis without compression deformity.  IMPRESSION: Chronically prominent interstitial markings without focal acute finding. Minimal crowding at the lung bases.   Electronically Signed   By: Conchita Paris M.D.   On: 04/21/2014 19:56   Ct Head Wo Contrast  04/21/2014   CLINICAL DATA:  80 year old male with headache and neck pain following fall.  EXAM: CT HEAD WITHOUT CONTRAST  CT CERVICAL SPINE WITHOUT CONTRAST  TECHNIQUE: Multidetector CT imaging of the head and cervical spine was performed following the standard protocol without intravenous contrast. Multiplanar CT image reconstructions of the cervical spine were also generated.  COMPARISON:  10/31/2012 CT  FINDINGS: CT HEAD FINDINGS  Atrophy and chronic small-vessel white  matter ischemic changes again noted.  No acute intracranial abnormalities are identified, including mass lesion or mass effect, hydrocephalus, extra-axial fluid collection, midline shift, hemorrhage, or acute infarction. The visualized bony calvarium is unremarkable.  CT CERVICAL SPINE FINDINGS  3 mm anterolisthesis of C5 on C6 and 1 mm anterolisthesis of C4 on C5 again noted.  There is no evidence of acute fracture or new subluxation.  No prevertebral soft tissue swelling is present.  Degenerative disc disease and spondylosis from C5-T1 again identified.  Moderate facet arthropathy throughout the cervical spine is unchanged.  No focal bony lesions are present.  IMPRESSION: No evidence of acute abnormality.  Atrophy and chronic small-vessel white matter ischemic changes.  Multilevel degenerative changes within the cervical spine again identified.   Electronically Signed   By: Hassan Rowan M.D.   On: 04/21/2014 20:06   Ct Cervical Spine Wo Contrast  04/21/2014   CLINICAL DATA:  80 year old male with headache and neck pain following fall.  EXAM: CT HEAD WITHOUT CONTRAST  CT CERVICAL SPINE WITHOUT CONTRAST  TECHNIQUE: Multidetector CT imaging of the head and cervical spine was performed following the standard protocol without intravenous contrast. Multiplanar CT image reconstructions of the cervical spine were also generated.  COMPARISON:  10/31/2012 CT  FINDINGS: CT HEAD FINDINGS  Atrophy and chronic small-vessel white matter ischemic changes again noted.  No acute intracranial abnormalities are identified, including mass lesion or mass effect, hydrocephalus, extra-axial fluid collection, midline shift, hemorrhage, or acute infarction. The visualized bony calvarium is unremarkable.  CT CERVICAL SPINE FINDINGS  3 mm anterolisthesis of C5 on C6 and 1 mm anterolisthesis of C4 on C5 again noted.  There is no evidence of acute fracture or new subluxation.  No prevertebral soft tissue swelling is present.  Degenerative disc  disease and spondylosis from C5-T1 again identified.  Moderate facet arthropathy throughout the cervical spine is unchanged.  No focal bony lesions are present.  IMPRESSION: No evidence of acute abnormality.  Atrophy and chronic small-vessel white matter ischemic changes.  Multilevel degenerative changes within the cervical spine again identified.   Electronically Signed   By: Hassan Rowan  M.D.   On: 04/21/2014 20:06     EKG Interpretation None      MDM   Final diagnoses:  None    Patient with a mechanical fall today at his facility his for head hit the bed post. No LOC and patient is at his baseline. He does take Coumadin for chronic A. fib last INR was 2 approximately 2 weeks ago. Family members states that this is his third or fourth fall in the last one week unclear why he is having more falls. Today his feet got tangled in the sheet.  The patient has no complaints at this time. He has no C-spine tenderness however given the most likely hyperflexion that he had when hitting his forehead on the bed post a head CT and C-spine imaging pending. INR, CBC, Chem-8, UA, chest x-ray pending for further evaluation as possible cause for new falls. No recent medication changes.  11:09 PM Labs without acute change.  Imaging neg for acute pathology.  INR Will d/c home.  Blanchie Dessert, MD 04/21/14 2330

## 2014-04-21 NOTE — ED Notes (Signed)
Museum/gallery conservator, Phlebotomist came when Xray was coming to take patient.

## 2014-04-21 NOTE — ED Notes (Signed)
Patient returned from [place]. Phlebotmoy at the bedside obtaining blood. Patient placed back on the monitor.

## 2014-04-22 ENCOUNTER — Telehealth: Payer: Self-pay | Admitting: Internal Medicine

## 2014-04-22 NOTE — Telephone Encounter (Signed)
Left detailed message on Ellen's personal voicemail told her pt's daughter Golden Circle called and had questions about pt's lungs. Left message for Dorian Pod that I could not speak to [facility] due to not on HIP[employer] and that pt's lungs were clear on exam and no wheezing noted in Dr. Marthann Schiller note. If any questions call office.

## 2014-04-22 NOTE — Telephone Encounter (Signed)
Pt daughter called to say she wanted Dr Raliegh Ip to know that her dad was in the ER last night at [facility] he had taken a fall . I ask if she was calling to make a follow up visit for him she said know

## 2014-04-22 NOTE — Telephone Encounter (Signed)
Dr. Raliegh Ip made aware and also received fax from [facility] regarding pt fall and was sent to ER.

## 2014-04-23 ENCOUNTER — Encounter (HOSPITAL_COMMUNITY): Payer: Self-pay | Admitting: Emergency Medicine

## 2014-04-23 ENCOUNTER — Emergency Department (HOSPITAL_COMMUNITY): Payer: Medicare Other

## 2014-04-23 ENCOUNTER — Inpatient Hospital Stay (HOSPITAL_COMMUNITY)
Admission: EM | Admit: 2014-04-23 | Discharge: 2014-04-26 | DRG: 189 | Disposition: A | Discharge status: Nursing Home (Includes ICF) | Payer: Medicare Other | Attending: Internal Medicine | Admitting: Internal Medicine

## 2014-04-23 DIAGNOSIS — J9600 Acute respiratory failure, unspecified whether with hypoxia or hypercapnia: Principal | ICD-10-CM | POA: Diagnosis present

## 2014-04-23 DIAGNOSIS — J449 Chronic obstructive pulmonary disease, unspecified: Secondary | ICD-10-CM

## 2014-04-23 DIAGNOSIS — R4189 Other symptoms and signs involving cognitive functions and awareness: Secondary | ICD-10-CM

## 2014-04-23 DIAGNOSIS — K219 Gastro-esophageal reflux disease without esophagitis: Secondary | ICD-10-CM | POA: Diagnosis present

## 2014-04-23 DIAGNOSIS — Z85828 Personal history of other malignant neoplasm of skin: Secondary | ICD-10-CM

## 2014-04-23 DIAGNOSIS — Z66 Do not resuscitate: Secondary | ICD-10-CM | POA: Diagnosis present

## 2014-04-23 DIAGNOSIS — E871 Hypo-osmolality and hyponatremia: Secondary | ICD-10-CM | POA: Diagnosis present

## 2014-04-23 DIAGNOSIS — J209 Acute bronchitis, unspecified: Secondary | ICD-10-CM

## 2014-04-23 DIAGNOSIS — F0390 Unspecified dementia without behavioral disturbance: Secondary | ICD-10-CM | POA: Diagnosis present

## 2014-04-23 DIAGNOSIS — I4891 Unspecified atrial fibrillation: Secondary | ICD-10-CM | POA: Diagnosis present

## 2014-04-23 DIAGNOSIS — Z7901 Long term (current) use of anticoagulants: Secondary | ICD-10-CM

## 2014-04-23 DIAGNOSIS — H3530 Unspecified macular degeneration: Secondary | ICD-10-CM | POA: Diagnosis present

## 2014-04-23 DIAGNOSIS — Z9181 History of falling: Secondary | ICD-10-CM

## 2014-04-23 DIAGNOSIS — I482 Chronic atrial fibrillation, unspecified: Secondary | ICD-10-CM

## 2014-04-23 DIAGNOSIS — I509 Heart failure, unspecified: Secondary | ICD-10-CM | POA: Insufficient documentation

## 2014-04-23 DIAGNOSIS — R0600 Dyspnea, unspecified: Secondary | ICD-10-CM

## 2014-04-23 DIAGNOSIS — I498 Other specified cardiac arrhythmias: Secondary | ICD-10-CM | POA: Diagnosis present

## 2014-04-23 DIAGNOSIS — I10 Essential (primary) hypertension: Secondary | ICD-10-CM | POA: Diagnosis present

## 2014-04-23 DIAGNOSIS — W19XXXA Unspecified fall, initial encounter: Secondary | ICD-10-CM | POA: Diagnosis present

## 2014-04-23 LAB — CBC
HEMATOCRIT: 43.9 % (ref 39.0–52.0)
Hemoglobin: 14.5 g/dL (ref 13.0–17.0)
MCH: 32.2 pg (ref 26.0–34.0)
MCHC: 33 g/dL (ref 30.0–36.0)
MCV: 97.3 fL (ref 78.0–100.0)
Platelets: 245 10*3/uL (ref 150–400)
RBC: 4.51 MIL/uL (ref 4.22–5.81)
RDW: 14.2 % (ref 11.5–15.5)
WBC: 12.9 10*3/uL — ABNORMAL HIGH (ref 4.0–10.5)

## 2014-04-23 LAB — COMPREHENSIVE METABOLIC PANEL
ALT: 25 U/L (ref 0–53)
AST: 36 U/L (ref 0–37)
Albumin: 3.6 g/dL (ref 3.5–5.2)
Alkaline Phosphatase: 104 U/L (ref 39–117)
BUN: 16 mg/dL (ref 6–23)
CALCIUM: 9.3 mg/dL (ref 8.4–10.5)
CHLORIDE: 98 meq/L (ref 96–112)
CO2: 20 mEq/L (ref 19–32)
CREATININE: 0.81 mg/dL (ref 0.50–1.35)
GFR calc non Af Amer: 75 mL/min — ABNORMAL LOW (ref >90)
GFR, EST AFRICAN AMERICAN: 87 mL/min — AB (ref >90)
GLUCOSE: 129 mg/dL — AB (ref 70–99)
Potassium: 4.2 mEq/L (ref 3.7–5.3)
Sodium: 136 mEq/L — ABNORMAL LOW (ref 137–147)
Total Bilirubin: 2.1 mg/dL — ABNORMAL HIGH (ref 0.3–1.2)
Total Protein: 8.2 g/dL (ref 6.0–8.3)

## 2014-04-23 LAB — PRO B NATRIURETIC PEPTIDE: Pro B Natriuretic peptide (BNP): 841.6 pg/mL — ABNORMAL HIGH (ref 0–450)

## 2014-04-23 LAB — PROTIME-INR
INR: 1.86 — ABNORMAL HIGH (ref 0.00–1.49)
PROTHROMBIN TIME: 20.9 s — AB (ref 11.6–15.2)

## 2014-04-23 LAB — DIGOXIN LEVEL: Digoxin Level: <0.3 ng/mL — ABNORMAL LOW (ref 0.8–2.0)

## 2014-04-23 LAB — TROPONIN I: Troponin I: <0.3 ng/mL (ref <0.30)

## 2014-04-23 MED ORDER — MEMANTINE HCL ER 28 MG PO CP24
28.0000 mg | ORAL_CAPSULE | Freq: Every day | ORAL | Status: DC
  Administered 2014-04-24 – 2014-04-26 (×3): 28 mg via ORAL
  Filled 2014-04-23 (×3): qty 28

## 2014-04-23 MED ORDER — ONDANSETRON HCL 4 MG/2ML IJ SOLN: 4.0000 mg | Freq: Four times a day (QID) | INTRAMUSCULAR | Status: DC | PRN

## 2014-04-23 MED ORDER — POLYETHYLENE GLYCOL 3350 17 G PO PACK
17.0000 g | PACK | Freq: Every day | ORAL | Status: DC | PRN
  Filled 2014-04-23: qty 1

## 2014-04-23 MED ORDER — DIGOXIN 125 MCG PO TABS
0.1250 mg | ORAL_TABLET | Freq: Every day | ORAL | Status: DC
  Administered 2014-04-24 – 2014-04-26 (×3): 0.125 mg via ORAL
  Filled 2014-04-23 (×3): qty 1

## 2014-04-23 MED ORDER — IPRATROPIUM BROMIDE 0.02 % IN SOLN
0.5000 mg | Freq: Four times a day (QID) | RESPIRATORY_TRACT | Status: DC
Start: 2014-04-23 — End: 2014-04-25
  Administered 2014-04-23 – 2014-04-24 (×5): 0.5 mg via RESPIRATORY_TRACT
  Filled 2014-04-23 (×5): qty 2.5

## 2014-04-23 MED ORDER — WARFARIN SODIUM 5 MG PO TABS
5.0000 mg | ORAL_TABLET | Freq: Once | ORAL | Status: AC
  Administered 2014-04-23: 5 mg via ORAL
  Filled 2014-04-23: qty 1

## 2014-04-23 MED ORDER — METHYLPREDNISOLONE SODIUM SUCC 125 MG IJ SOLR
60.0000 mg | Freq: Two times a day (BID) | INTRAMUSCULAR | Status: DC
  Administered 2014-04-23 – 2014-04-25 (×4): 60 mg via INTRAVENOUS
  Filled 2014-04-23 (×3): qty 0.96
  Filled 2014-04-23: qty 2
  Filled 2014-04-23 (×2): qty 0.96

## 2014-04-23 MED ORDER — WARFARIN SODIUM 5 MG PO TABS: 5.0000 mg | ORAL_TABLET | ORAL | Status: DC

## 2014-04-23 MED ORDER — IPRATROPIUM BROMIDE 0.02 % IN SOLN
0.5000 mg | Freq: Once | RESPIRATORY_TRACT | Status: AC
  Administered 2014-04-23: 0.5 mg via RESPIRATORY_TRACT
  Filled 2014-04-23: qty 2.5

## 2014-04-23 MED ORDER — ONDANSETRON HCL 4 MG PO TABS: 4.0000 mg | ORAL_TABLET | Freq: Four times a day (QID) | ORAL | Status: DC | PRN

## 2014-04-23 MED ORDER — SERTRALINE HCL 25 MG PO TABS
25.0000 mg | ORAL_TABLET | Freq: Every day | ORAL | Status: DC
  Administered 2014-04-24 – 2014-04-26 (×3): 25 mg via ORAL
  Filled 2014-04-23 (×3): qty 1

## 2014-04-23 MED ORDER — LATANOPROST 0.005 % OP SOLN
1.0000 [drp] | Freq: Every day | OPHTHALMIC | Status: DC
  Administered 2014-04-23 – 2014-04-25 (×3): 1 [drp] via OPHTHALMIC
  Filled 2014-04-23: qty 2.5

## 2014-04-23 MED ORDER — POLYETHYL GLYCOL-PROPYL GLYCOL 0.4-0.3 % OP GEL: 1.0000 "application " | Freq: Every day | OPHTHALMIC | Status: DC

## 2014-04-23 MED ORDER — WARFARIN - PHARMACIST DOSING INPATIENT: Freq: Every day | Status: DC

## 2014-04-23 MED ORDER — TOBRAMYCIN 0.3 % OP SOLN
1.0000 [drp] | OPHTHALMIC | Status: DC
  Administered 2014-04-23 – 2014-04-26 (×16): 1 [drp] via OPHTHALMIC
  Filled 2014-04-23: qty 5

## 2014-04-23 MED ORDER — DICLOFENAC SODIUM 1 % TD GEL
2.0000 g | Freq: Two times a day (BID) | TRANSDERMAL | Status: DC
  Administered 2014-04-23 – 2014-04-26 (×6): 2 g via TOPICAL
  Filled 2014-04-23: qty 100

## 2014-04-23 MED ORDER — PURALUBE 85-15 % OP OINT: 1.0000 "application " | TOPICAL_OINTMENT | Freq: Three times a day (TID) | OPHTHALMIC | Status: DC

## 2014-04-23 MED ORDER — ACETAMINOPHEN 325 MG PO TABS: 650.0000 mg | ORAL_TABLET | Freq: Four times a day (QID) | ORAL | Status: DC | PRN

## 2014-04-23 MED ORDER — ALBUTEROL SULFATE (2.5 MG/3ML) 0.083% IN NEBU: 5.0000 mg | INHALATION_SOLUTION | Freq: Four times a day (QID) | RESPIRATORY_TRACT | Status: DC | PRN

## 2014-04-23 MED ORDER — CHLORHEXIDINE GLUCONATE 0.12 % MT SOLN
15.0000 mL | Freq: Two times a day (BID) | OROMUCOSAL | Status: DC
  Administered 2014-04-23 – 2014-04-26 (×6): 15 mL via OROMUCOSAL
  Filled 2014-04-23 (×8): qty 15

## 2014-04-23 MED ORDER — FUROSEMIDE 10 MG/ML IJ SOLN
40.0000 mg | Freq: Once | INTRAMUSCULAR | Status: AC
  Administered 2014-04-23: 40 mg via INTRAVENOUS
  Filled 2014-04-23: qty 4

## 2014-04-23 MED ORDER — LEVOFLOXACIN IN D5W 750 MG/150ML IV SOLN
750.0000 mg | INTRAVENOUS | Status: DC
  Administered 2014-04-23 – 2014-04-24 (×2): 750 mg via INTRAVENOUS
  Filled 2014-04-23 (×3): qty 150

## 2014-04-23 MED ORDER — ALBUTEROL SULFATE (2.5 MG/3ML) 0.083% IN NEBU
2.5000 mg | INHALATION_SOLUTION | Freq: Four times a day (QID) | RESPIRATORY_TRACT | Status: DC
  Administered 2014-04-23 – 2014-04-24 (×3): 2.5 mg via RESPIRATORY_TRACT
  Filled 2014-04-23 (×3): qty 3

## 2014-04-23 MED ORDER — POLYVINYL ALCOHOL 1.4 % OP SOLN
1.0000 [drp] | Freq: Every day | OPHTHALMIC | Status: DC | PRN
  Administered 2014-04-23 – 2014-04-26 (×2): 1 [drp] via OPHTHALMIC
  Filled 2014-04-23: qty 15

## 2014-04-23 MED ORDER — SODIUM CHLORIDE 0.9 % IV SOLN: INTRAVENOUS | Status: DC

## 2014-04-23 MED ORDER — PANTOPRAZOLE SODIUM 40 MG PO TBEC
40.0000 mg | DELAYED_RELEASE_TABLET | Freq: Every day | ORAL | Status: DC
  Administered 2014-04-23 – 2014-04-26 (×4): 40 mg via ORAL
  Filled 2014-04-23 (×4): qty 1

## 2014-04-23 MED ORDER — SODIUM CHLORIDE 0.9 % IV SOLN
INTRAVENOUS | Status: DC
  Administered 2014-04-23: 13:00:00 via INTRAVENOUS

## 2014-04-23 MED ORDER — BIOTENE DRY MOUTH MT LIQD
15.0000 mL | Freq: Two times a day (BID) | OROMUCOSAL | Status: DC
  Administered 2014-04-24 – 2014-04-26 (×6): 15 mL via OROMUCOSAL

## 2014-04-23 MED ORDER — SULFACETAMIDE SODIUM 10 % OP SOLN
2.0000 [drp] | Freq: Four times a day (QID) | OPHTHALMIC | Status: DC
  Administered 2014-04-23 – 2014-04-26 (×10): 2 [drp] via OPHTHALMIC
  Filled 2014-04-23: qty 15

## 2014-04-23 MED ORDER — WARFARIN SODIUM 2.5 MG PO TABS: 2.5000 mg | ORAL_TABLET | Freq: Every day | ORAL | Status: DC

## 2014-04-23 MED ORDER — ALBUTEROL SULFATE HFA 108 (90 BASE) MCG/ACT IN AERS: 2.0000 | INHALATION_SPRAY | Freq: Four times a day (QID) | RESPIRATORY_TRACT | Status: DC | PRN

## 2014-04-23 MED ORDER — ALBUTEROL SULFATE (2.5 MG/3ML) 0.083% IN NEBU
5.0000 mg | INHALATION_SOLUTION | Freq: Once | RESPIRATORY_TRACT | Status: AC
  Administered 2014-04-23: 5 mg via RESPIRATORY_TRACT
  Filled 2014-04-23: qty 6

## 2014-04-23 MED ORDER — POLYMYXIN B-TRIMETHOPRIM 10000-0.1 UNIT/ML-% OP SOLN
2.0000 [drp] | Freq: Four times a day (QID) | OPHTHALMIC | Status: DC
  Administered 2014-04-23 – 2014-04-26 (×10): 2 [drp] via OPHTHALMIC
  Filled 2014-04-23: qty 10

## 2014-04-23 MED ORDER — ALBUTEROL SULFATE (2.5 MG/3ML) 0.083% IN NEBU
2.5000 mg | INHALATION_SOLUTION | Freq: Four times a day (QID) | RESPIRATORY_TRACT | Status: DC | PRN
Start: 2014-04-23 — End: 2014-04-24

## 2014-04-23 MED ORDER — ACETAMINOPHEN 650 MG RE SUPP: 650.0000 mg | Freq: Four times a day (QID) | RECTAL | Status: DC | PRN

## 2014-04-23 MED ORDER — ARTIFICIAL TEARS OP OINT
TOPICAL_OINTMENT | Freq: Three times a day (TID) | OPHTHALMIC | Status: DC
  Administered 2014-04-23: 22:00:00 via OPHTHALMIC
  Administered 2014-04-24: 1 via OPHTHALMIC
  Administered 2014-04-24 (×2): via OPHTHALMIC
  Administered 2014-04-25: 1 via OPHTHALMIC
  Administered 2014-04-25 – 2014-04-26 (×3): via OPHTHALMIC
  Administered 2014-04-26: 1 via OPHTHALMIC
  Filled 2014-04-23: qty 3.5

## 2014-04-23 NOTE — ED Notes (Signed)
All personal items given to son

## 2014-04-23 NOTE — Progress Notes (Signed)
ANTICOAGULATION CONSULT NOTE - Initial Consult  Pharmacy Consult for warfarin Indication: atrial fibrillation  No Known Allergies  Patient Measurements:     Vital Signs: Temp: 98 F (36.7 C) (06/06 1606) Temp src: Oral (06/06 1606) BP: 111/75 mmHg (06/06 1606) Pulse Rate: 105 (06/06 1600)  Labs:  Recent Labs  04/21/14 2004 04/21/14 2012 04/21/14 2109 04/23/14 1250  HGB 13.7 15.3  --  14.5  HCT 41.1 45.0  --  43.9  PLT 232  --   --  245  LABPROT  --   --  22.5* 20.9*  INR  --   --  2.05* 1.86*  CREATININE  --  1.00  --  0.81  TROPONINI  --   --   --  <0.30    The CrCl is unknown because both a height and weight (above a minimum accepted value) are required for this calculation.   Medical History: Past Medical History  Diagnosis Date  . Irregular heart rate   . Macular degeneration   . Cognitive impairment   . Hypotension   . 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 Glaucoma   . Atrial fibrillation   . Dysrhythmia   . GERD (gastroesophageal reflux disease)     Assessment: 89 YOM on chronic warfarin for AFib, admitted with SOB and started on levofloxacin which can potentiate INR. Admission INR is 1.86. Baseline hgb 14.5, plts 245- no bleeding noted. Home dose of warfarin 2.5mg  daily except 5mg  on Tuesday and Saturdays.  Goal of Therapy:  INR 2-3 Monitor platelets by anticoagulation protocol: Yes   Plan:  1. Warfarin 5mg  po x1 tonight with subtherapeutic INR 2. Daily PT/INR- follow closely with levofloxacin 3. Follow for s/s bleeding  Candelario Steppe D. Omunique Pederson, PharmD, BCPS Clinical Pharmacist Pager: [phone] 04/23/2014 5:05 PM

## 2014-04-23 NOTE — H&P (Signed)
[facility] History and Physical  Howard Payne NFA:[id number] DOB: [birth year] DOA: 04/23/2014  Referring physician: Dr. Ashok Cordia PCP: Nyoka Cowden, MD   Chief Complaint: SOB  HPI: Howard Payne is a 80 y.o. male  Past medical history of skin cancer and atrial fibrillation on Coumadin, hypertension, mild dementia that comes in for shortness of breath that started about 1 days prior to admission. As per family member he was brought to the ED 2 days prior to admission for fall and was sent back to his assisted living facility. Since then he's been getting mildly short of breath with some cough progressively getting worse. To the point he's not eating and slightly confused. The patient cannot give a history so most of the history was obtained from the chart and from her son Howard Payne. He relates no fevers at home some anorexia.  In the ED: His  mildly tachycardic satting about 90% on room air CBC was done that showed a mild white count chest x-ray was done with chronic changes, and basic metabolic panel shows mild hyponatremia with a current list in the 100s we are consulted for further evaluation and possible admission.   Review of Systems:  Constitutional:  No weight loss, night sweats, Fevers, chills, fatigue.  HEENT:  No headaches, Difficulty swallowing,Tooth/dental problems,Sore throat,  No sneezing, itching, ear ache, nasal congestion, post nasal drip,  Cardio-vascular:  No chest pain, Orthopnea, PND, swelling in lower extremities, anasarca, dizziness, palpitations  GI:  No heartburn, indigestion, abdominal pain, nausea, vomiting, diarrhea, change in bowel habits, loss of appetite  Skin:  no rash or lesions.  GU:  no dysuria, change in color of urine, no urgency or frequency. No flank pain.  Musculoskeletal:  No joint pain or swelling. No decreased range of motion. No back pain.  Psych:  No change in mood or affect. No depression or anxiety. No memory loss.   Past  Medical History  Diagnosis Date  . Irregular heart rate   . Macular degeneration   . Cognitive impairment   . Hypotension   . 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 Glaucoma   . Atrial fibrillation   . Dysrhythmia   . GERD (gastroesophageal reflux disease)    Past Surgical History  Procedure Laterality Date  . Mohs surgery  2006, 07/06/12    Left Lower Eyelid: Invasive Squamous Cell Carcinoma  . Shave biopsy  05/14/12    Left Lower Forehead  . Skin punch biopsy  05/14/12    Mid Left Lower Eyelid  . Eye surgery     Social History:  reports that he has never smoked. He has never used smokeless tobacco. He reports that he does not drink alcohol or use illicit drugs.  No Known Allergies  Family History  Problem Relation Age of Onset  . Skin cancer Sister     Non-melanoma  . Stroke Mother   . Stroke Father   . Diabetes Father      Prior to Admission medications   Medication Sig Start Date End Date Taking? Authorizing Provider  acetaminophen (TYLENOL) 325 MG tablet Take 650 mg by mouth 3 (three) times daily.   Yes Historical Provider, MD  albuterol (PROVENTIL HFA;VENTOLIN HFA) 108 (90 BASE) MCG/ACT inhaler Inhale 2 puffs into the lungs every 6 (six) hours as needed for wheezing or shortness of breath. 10/05/13  Yes Marletta Lor,  MD  albuterol (PROVENTIL) (2.5 MG/3ML) 0.083% nebulizer solution Take 2.5 mg by nebulization every 6 (six) hours as needed for wheezing or shortness of breath.   Yes Historical Provider, MD  diclofenac sodium (VOLTAREN) 1 % GEL Apply 2 g topically 2 (two) times daily. Apply to right knee   Yes Historical Provider, MD  digoxin (LANOXIN) 0.125 MG tablet Take 0.125 mg by mouth daily.   Yes Historical Provider, MD  diphenhydrAMINE (BENADRYL) 25 MG tablet Take 25 mg by mouth every 6 (six) hours as needed for itching.   Yes Historical Provider, MD    Eyelid Cleansers (OCUSOFT LID SCRUB EX) Place 1 application into the left eye daily.    Yes Historical Provider, MD  guaiFENesin (MUCINEX) 600 MG 12 hr tablet Take 600 mg by mouth 2 (two) times daily as needed for cough or to loosen phlegm. 10/05/13  Yes Marletta Lor, MD  guaiFENesin-codeine [facility]) 100-10 MG/5ML syrup Take 5 mLs by mouth every 6 (six) hours as needed for cough.    Yes Historical Provider, MD  latanoprost (XALATAN) 0.005 % ophthalmic solution Place 1 drop into both eyes at bedtime.   Yes Historical Provider, MD  Memantine HCl ER (NAMENDA XR) 28 MG CP24 Take 28 mg by mouth daily.   Yes Historical Provider, MD  omeprazole (PRILOSEC) 20 MG capsule Take 1 capsule (20 mg total) by mouth daily. 08/18/13  Yes Marletta Lor, MD  Polyethyl Glycol-Propyl Glycol (SYSTANE) 0.4-0.3 % GEL Place 1 application into the left eye at bedtime. On to eyelid   Yes Historical Provider, MD  polyvinyl alcohol (LIQUIFILM TEARS) 1.4 % ophthalmic solution Place 1 drop into the left eye daily as needed for dry eyes.   Yes Historical Provider, MD  sertraline (ZOLOFT) 25 MG tablet Take 1 tablet (25 mg total) by mouth daily. 09/15/12  Yes Marletta Lor, MD  sulfacetamide (BLEPH-10) 10 % ophthalmic solution Place 2 drops into the left eye 4 (four) times daily.   Yes Historical Provider, MD  tobramycin (TOBREX) 0.3 % ophthalmic solution Place 1 drop into both eyes every 4 (four) hours. Use drops for 7 days 04/21/14  Yes Blanchie Dessert, MD  warfarin (COUMADIN) 2.5 MG tablet Take 2.5 mg by mouth daily. Takes 2.5 mg by mouth every Sunday, Monday, Wednesday, Thursday, Friday. 01/13/13  Yes Marletta Lor, MD  warfarin (COUMADIN) 5 MG tablet Take 5 mg by mouth See admin instructions. Take 5 mg by mouth every Tuesday and Saturday.   Yes Historical Provider, MD  White Petrolatum-Mineral Oil (PURALUBE) 85-15 % OINT Place 1 application into the left eye 3 (three) times daily.   Yes Historical  Provider, MD  trimethoprim-polymyxin b (POLYTRIM) ophthalmic solution Place 2 drops into the left eye 4 (four) times daily.    Historical Provider, MD   Physical Exam: Filed Vitals:   04/23/14 1500  BP: 133/86  Pulse: 104  Temp:   Resp:     BP 133/86  Pulse 104  Temp(Src) 98.5 F (36.9 C) (Oral)  Resp 19  SpO2 100%  General:  Appears calm and comfortable Eyes: PERRL, normal lids, irises & conjunctiva ENT: grossly normal hearing, lips & tongue Neck: no LAD, masses or thyromegaly Cardiovascular: RRR, no m/r/g.  Respiratory: Good air movement with crackles on the left and wheezing bilaterally.. Abdomen: soft, ntnd Skin: no rash or induration seen on limited exam Musculoskeletal: grossly normal tone BUE/BLE Psychiatric: grossly normal mood and affect, speech fluent and appropriate Neurologic: grossly non-focal.  Labs on Admission:  Basic Metabolic Panel:  Recent Labs Lab 04/21/14 2012 04/23/14 1250  NA 140 136*  K 4.1 4.2  CL 98 98  CO2  --  20  GLUCOSE 162* 129*  BUN 18 16  CREATININE 1.00 0.81  CALCIUM  --  9.3   Liver Function Tests:  Recent Labs Lab 04/23/14 1250  AST 36  ALT 25  ALKPHOS 104  BILITOT 2.1*  PROT 8.2  ALBUMIN 3.6   No results found for this basename: LIPASE, AMYLASE,  in the last 168 hours No results found for this basename: AMMONIA,  in the last 168 hours CBC:  Recent Labs Lab 04/21/14 2004 04/21/14 2012 04/23/14 1250  WBC 10.6*  --  12.9*  NEUTROABS 8.4*  --   --   HGB 13.7 15.3 14.5  HCT 41.1 45.0 43.9  MCV 96.9  --  97.3  PLT 232  --  245   Cardiac Enzymes:  Recent Labs Lab 04/23/14 1250  TROPONINI <0.30    BNP (last 3 results)  Recent Labs  04/23/14 1250  PROBNP 841.6*   CBG: No results found for this basename: GLUCAP,  in the last 168 hours  Radiological Exams on Admission: Dg Chest 2 View  04/23/2014   CLINICAL DATA:  Wheezing and shortness of breath  EXAM: CHEST  2 VIEW  COMPARISON:  April 21, 2014  FINDINGS: There is underlying emphysematous change. There is persistent interstitial prominence, primarily in the lung bases but also present in the mid lung regions. There is no well-defined consolidation. There is cardiomegaly. The pulmonary vascularity suggests mild pulmonary venous hypertension. No adenopathy. There is atherosclerotic change in the aorta. No bone lesions are appreciated. There is degenerative change in the thoracic spine.  IMPRESSION: Findings felt to represent chronic congestive heart failure superimposed on emphysematous change. No consolidation.   Electronically Signed   By: Lowella Grip M.D.   On: 04/23/2014 14:07   Dg Chest 2 View  04/21/2014   CLINICAL DATA:  Cough  EXAM: CHEST  2 VIEW  COMPARISON:  03/04/2014  FINDINGS: Cardiomegaly noted. Coarsened interstitial markings are identified bilaterally without focal opacity. Minimal crowding at the lung bases is identified. No pleural effusion. No new parenchymal opacification. Mild kyphosis without compression deformity.  IMPRESSION: Chronically prominent interstitial markings without focal acute finding. Minimal crowding at the lung bases.   Electronically Signed   By: Conchita Paris M.D.   On: 04/21/2014 19:56   Ct Head Wo Contrast  04/21/2014   CLINICAL DATA:  80 year old male with headache and neck pain following fall.  EXAM: CT HEAD WITHOUT CONTRAST  CT CERVICAL SPINE WITHOUT CONTRAST  TECHNIQUE: Multidetector CT imaging of the head and cervical spine was performed following the standard protocol without intravenous contrast. Multiplanar CT image reconstructions of the cervical spine were also generated.  COMPARISON:  10/31/2012 CT  FINDINGS: CT HEAD FINDINGS  Atrophy and chronic small-vessel white matter ischemic changes again noted.  No acute intracranial abnormalities are identified, including mass lesion or mass effect, hydrocephalus, extra-axial fluid collection, midline shift, hemorrhage, or acute infarction. The  visualized bony calvarium is unremarkable.  CT CERVICAL SPINE FINDINGS  3 mm anterolisthesis of C5 on C6 and 1 mm anterolisthesis of C4 on C5 again noted.  There is no evidence of acute fracture or new subluxation.  No prevertebral soft tissue swelling is present.  Degenerative disc disease and spondylosis from C5-T1 again identified.  Moderate facet arthropathy throughout the cervical spine is  unchanged.  No focal bony lesions are present.  IMPRESSION: No evidence of acute abnormality.  Atrophy and chronic small-vessel white matter ischemic changes.  Multilevel degenerative changes within the cervical spine again identified.   Electronically Signed   By: Hassan Rowan M.D.   On: 04/21/2014 20:06   Ct Cervical Spine Wo Contrast  04/21/2014   CLINICAL DATA:  80 year old male with headache and neck pain following fall.  EXAM: CT HEAD WITHOUT CONTRAST  CT CERVICAL SPINE WITHOUT CONTRAST  TECHNIQUE: Multidetector CT imaging of the head and cervical spine was performed following the standard protocol without intravenous contrast. Multiplanar CT image reconstructions of the cervical spine were also generated.  COMPARISON:  10/31/2012 CT  FINDINGS: CT HEAD FINDINGS  Atrophy and chronic small-vessel white matter ischemic changes again noted.  No acute intracranial abnormalities are identified, including mass lesion or mass effect, hydrocephalus, extra-axial fluid collection, midline shift, hemorrhage, or acute infarction. The visualized bony calvarium is unremarkable.  CT CERVICAL SPINE FINDINGS  3 mm anterolisthesis of C5 on C6 and 1 mm anterolisthesis of C4 on C5 again noted.  There is no evidence of acute fracture or new subluxation.  No prevertebral soft tissue swelling is present.  Degenerative disc disease and spondylosis from C5-T1 again identified.  Moderate facet arthropathy throughout the cervical spine is unchanged.  No focal bony lesions are present.  IMPRESSION: No evidence of acute abnormality.  Atrophy and  chronic small-vessel white matter ischemic changes.  Multilevel degenerative changes within the cervical spine again identified.   Electronically Signed   By: Hassan Rowan M.D.   On: 04/21/2014 20:06    EKG: Independently reviewed. Sinus tachy with non specific T wave changes  Assessment/Plan Acute respiratory failure/  Acute bronchitis: - He was given IV Lasix in the emergency room. His chloride is less than 100 his mild hyponatremia, he is wheezing on physical exam with productive cough, no fevers and a white count with left shift. I am more concerned about an acute pulmonary infectious process, more than in acute decompensated heart failure. He did have a previous echo on his last admission that shows a normal EF with no diastolic dysfunction, no aortic stenosis, the BNP on this admission is less than 900. Chest x-ray did not show any infiltrate, he is having a productive cough for the last shift on his CBC which is more concerning for an infectious process. - Had an admit him to the med surge bed start him on IV fluids to try to correct the hydrophilic traumatic imbalance. Started on IV Solu-Medrol IV antibiotics, albuterol and Atrovent. - We'll try to get a sputum culture. - PT and OT consult. - First set of cardiac enzymes is negative, his EKG showed nonspecific T wave changes. The patient and the son denied any chest pain.  Hyponatremia: This probably due to decreased intravascular volume start on IV fluids check a basic metabolic panel in the morning.  Sinus tachycardia with a history of  Chronic atrial fibrillation on Chronic anticoagulation - Continue current home meds as this level is less than 0.3. We'll continue this current dose.  - Continue Coumadin per pharmacy. INR subtherapeutic.  Dementia: - We'll continue his current home meds. Seems to be stable.   Code Status: [id number]?DNI Family Communication: son Disposition Plan: inpatient  Time spent: 24 minutes  [facility] Pager [phone]  **Disclaimer: This note may have been dictated with voice recognition software. Similar sounding words can inadvertently be transcribed and  this note may contain transcription errors which may not have been corrected upon publication of note.**

## 2014-04-23 NOTE — ED Notes (Addendum)
The pt has dementia. He was brought from assisted living by son for SOB. The pt denies any complaints. Son states he was here recently for a fall. Today he seemed to be sob while the son was visiting. Mildly labored breathing noted while pt at rest

## 2014-04-23 NOTE — ED Notes (Signed)
Report attempted x 1

## 2014-04-23 NOTE — ED Notes (Signed)
Pt wheezing at the time. Nebulizer treatment started. Vital signs stable. Family at bedside.

## 2014-04-23 NOTE — ED Provider Notes (Signed)
CSN: [id number]     Arrival date & time 04/23/14  1208 History   First MD Initiated Contact with Patient 04/23/14 1231     Chief Complaint  Patient presents with  . Shortness of Breath     (Consider location/radiation/quality/duration/timing/severity/associated sxs/prior Treatment) Patient is a 80 y.o. male presenting with shortness of breath. The history is provided by the patient and a relative. The history is limited by the condition of the patient.  Shortness of Breath Associated symptoms: no abdominal pain, no chest pain, no cough, no fever, no headaches, no neck pain, no rash, no sore throat and no vomiting   pt with hx afib, advanced age, presents from [facility], w c/o sob in the past few days. Family too has noticed labored breathing the past couple days. Pt limited historian - level 5 caveat.  No chest pain. No cough. No fever or chills. +increased bilateral leg swelling. ?orthopnea, no pnd.   Compliant w normal meds. w above symptoms, denies specific exacerbating or alleviating factors, pt relatively sedentary at baseline.      Past Medical History  Diagnosis Date  . Irregular heart rate   . Macular degeneration   . Cognitive impairment   . Hypotension   . 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 Glaucoma   . Atrial fibrillation   . Dysrhythmia   . GERD (gastroesophageal reflux disease)    Past Surgical History  Procedure Laterality Date  . Mohs surgery  2006, 07/06/12    Left Lower Eyelid: Invasive Squamous Cell Carcinoma  . Shave biopsy  05/14/12    Left Lower Forehead  . Skin punch biopsy  05/14/12    Mid Left Lower Eyelid  . Eye surgery     Family History  Problem Relation Age of Onset  . Skin cancer Sister     Non-melanoma  . Stroke Mother   . Stroke Father   . Diabetes Father    History  Substance Use Topics  . Smoking status: Never Smoker   . Smokeless  tobacco: Never Used  . Alcohol Use: No    Review of Systems  Constitutional: Negative for fever.  HENT: Negative for sore throat.   Eyes: Negative for redness.  Respiratory: Positive for shortness of breath. Negative for cough.   Cardiovascular: Positive for leg swelling. Negative for chest pain.  Gastrointestinal: Negative for vomiting, abdominal pain and diarrhea.  Genitourinary: Negative for flank pain.  Musculoskeletal: Negative for back pain and neck pain.  Skin: Negative for rash.  Neurological: Negative for headaches.  Hematological: Does not bruise/bleed easily.  Psychiatric/Behavioral: Negative for confusion.      Allergies  Review of patient's allergies indicates no known allergies.  Home Medications   Prior to Admission medications   Medication Sig Start Date End Date Taking? Authorizing Provider  acetaminophen (TYLENOL) 325 MG tablet Take 650 mg by mouth 3 (three) times daily.    Historical Provider, MD  albuterol (PROVENTIL HFA;VENTOLIN HFA) 108 (90 BASE) MCG/ACT inhaler Inhale 2 puffs into the lungs every 6 (six) hours as needed for wheezing or shortness of breath. 10/05/13   Marletta Lor, MD  albuterol (PROVENTIL) (2.5 MG/3ML) 0.083% nebulizer solution Take 2.5 mg by nebulization every 6 (six) hours as needed for wheezing or shortness of breath.    Historical Provider, MD  diclofenac sodium (VOLTAREN) 1 % GEL Apply 2 g  topically 2 (two) times daily. Apply to right knee    Historical Provider, MD  digoxin (LANOXIN) 0.125 MG tablet Take 0.125 mg by mouth daily.    Historical Provider, MD  diphenhydrAMINE (BENADRYL) 25 MG tablet Take 25 mg by mouth every 6 (six) hours as needed for itching.    Historical Provider, MD  Eyelid Cleansers (OCUSOFT LID SCRUB EX) Place 1 application into the left eye daily.     Historical Provider, MD  guaiFENesin (MUCINEX) 600 MG 12 hr tablet Take 600 mg by mouth 2 (two) times daily as needed for cough or to loosen phlegm. 10/05/13    Marletta Lor, MD  guaiFENesin-codeine [facility]) 100-10 MG/5ML syrup Take 5 mLs by mouth every 6 (six) hours as needed for cough.     Historical Provider, MD  latanoprost (XALATAN) 0.005 % ophthalmic solution Place 1 drop into both eyes at bedtime.    Historical Provider, MD  Memantine HCl ER (NAMENDA XR) 28 MG CP24 Take 28 mg by mouth daily.    Historical Provider, MD  omeprazole (PRILOSEC) 20 MG capsule Take 1 capsule (20 mg total) by mouth daily. 08/18/13   Marletta Lor, MD  Polyethyl Glycol-Propyl Glycol (SYSTANE) 0.4-0.3 % GEL Place 1 application into the left eye at bedtime. On to eyelid    Historical Provider, MD  polyvinyl alcohol (LIQUIFILM TEARS) 1.4 % ophthalmic solution Place 1 drop into the left eye daily as needed for dry eyes.    Historical Provider, MD  sertraline (ZOLOFT) 25 MG tablet Take 1 tablet (25 mg total) by mouth daily. 09/15/12   Marletta Lor, MD  sulfacetamide (BLEPH-10) 10 % ophthalmic solution Place 2 drops into the left eye 4 (four) times daily.    Historical Provider, MD  tobramycin (TOBREX) 0.3 % ophthalmic solution Place 1 drop into both eyes every 4 (four) hours. Use drops for 7 days 04/21/14   Blanchie Dessert, MD  trimethoprim-polymyxin b (POLYTRIM) ophthalmic solution Place 2 drops into the left eye 4 (four) times daily.    Historical Provider, MD  warfarin (COUMADIN) 2.5 MG tablet Take 2.5 mg by mouth daily. Takes 2.5 mg by mouth every Sunday, Monday, Wednesday, Thursday, Friday. 01/13/13   Marletta Lor, MD  warfarin (COUMADIN) 5 MG tablet Take 5 mg by mouth See admin instructions. Take 5 mg by mouth every Tuesday and Saturday.    Historical Provider, MD  White Petrolatum-Mineral Oil (PURALUBE) 85-15 % OINT Place 1 application into the left eye 3 (three) times daily.    Historical Provider, MD   BP 133/84  Pulse 105  Temp(Src) 98.5 F (36.9 C) (Oral)  Resp 24  SpO2 97% Physical Exam  Nursing note and vitals  reviewed. Constitutional:  Elderly, frail appearing, dyspneic.   HENT:  Head: Atraumatic.  Eyes: No scleral icterus.  Neck: Neck supple. No tracheal deviation present.  Cardiovascular: Regular rhythm, normal heart sounds and intact distal pulses.   Pulmonary/Chest: Effort normal. No accessory muscle usage.  Rhonchi bases.   Abdominal: Soft. Bowel sounds are normal. He exhibits no distension. There is no tenderness.  Genitourinary:  No cva tenderness  Musculoskeletal: Normal range of motion. He exhibits edema. He exhibits no tenderness.  2+ edema bil lower legs.   Neurological: He is alert.  Awake and alert, responds briefly, yes/no to questions.   Skin: Skin is warm and dry. No rash noted.  Psychiatric: He has a normal mood and affect.    ED Course  Procedures (including  critical care time) Labs Review Labs Reviewed  CBC  PRO B NATRIURETIC PEPTIDE  COMPREHENSIVE METABOLIC PANEL  TROPONIN I  PROTIME-INR  DIGOXIN LEVEL   Results for orders placed during the hospital encounter of 04/23/14  CBC      Result Value Ref Range   WBC 12.9 (*) 4.0 - 10.5 K/uL   RBC 4.51  4.22 - 5.81 MIL/uL   Hemoglobin 14.5  13.0 - 17.0 g/dL   HCT 43.9  39.0 - 52.0 %   MCV 97.3  78.0 - 100.0 fL   MCH 32.2  26.0 - 34.0 pg   MCHC 33.0  30.0 - 36.0 g/dL   RDW 14.2  11.5 - 15.5 %   Platelets 245  150 - 400 K/uL  PRO B NATRIURETIC PEPTIDE      Result Value Ref Range   Pro B Natriuretic peptide (BNP) 841.6 (*) 0 - 450 pg/mL  COMPREHENSIVE METABOLIC PANEL      Result Value Ref Range   Sodium 136 (*) 137 - 147 mEq/L   Potassium 4.2  3.7 - 5.3 mEq/L   Chloride 98  96 - 112 mEq/L   CO2 20  19 - 32 mEq/L   Glucose, Bld 129 (*) 70 - 99 mg/dL   BUN 16  6 - 23 mg/dL   Creatinine, Ser 0.81  0.50 - 1.35 mg/dL   Calcium 9.3  8.4 - 10.5 mg/dL   Total Protein 8.2  6.0 - 8.3 g/dL   Albumin 3.6  3.5 - 5.2 g/dL   AST 36  0 - 37 U/L   ALT 25  0 - 53 U/L   Alkaline Phosphatase 104  39 - 117 U/L   Total  Bilirubin 2.1 (*) 0.3 - 1.2 mg/dL   GFR calc non Af Amer 75 (*) >90 mL/min   GFR calc Af Amer 87 (*) >90 mL/min  TROPONIN I      Result Value Ref Range   Troponin I <0.30  <0.30 ng/mL  PROTIME-INR      Result Value Ref Range   Prothrombin Time 20.9 (*) 11.6 - 15.2 seconds   INR 1.86 (*) 0.00 - 1.49  DIGOXIN LEVEL      Result Value Ref Range   Digoxin Level <0.3 (*) 0.8 - 2.0 ng/mL   Dg Chest 2 View  04/23/2014   CLINICAL DATA:  Wheezing and shortness of breath  EXAM: CHEST  2 VIEW  COMPARISON:  April 21, 2014  FINDINGS: There is underlying emphysematous change. There is persistent interstitial prominence, primarily in the lung bases but also present in the mid lung regions. There is no well-defined consolidation. There is cardiomegaly. The pulmonary vascularity suggests mild pulmonary venous hypertension. No adenopathy. There is atherosclerotic change in the aorta. No bone lesions are appreciated. There is degenerative change in the thoracic spine.  IMPRESSION: Findings felt to represent chronic congestive heart failure superimposed on emphysematous change. No consolidation.   Electronically Signed   By: Lowella Grip M.D.   On: 04/23/2014 14:07   Dg Chest 2 View  04/21/2014   CLINICAL DATA:  Cough  EXAM: CHEST  2 VIEW  COMPARISON:  03/04/2014  FINDINGS: Cardiomegaly noted. Coarsened interstitial markings are identified bilaterally without focal opacity. Minimal crowding at the lung bases is identified. No pleural effusion. No new parenchymal opacification. Mild kyphosis without compression deformity.  IMPRESSION: Chronically prominent interstitial markings without focal acute finding. Minimal crowding at the lung bases.   Electronically Signed   By: Elzie Rings  Green M.D.   On: 04/21/2014 19:56   Ct Head Wo Contrast  04/21/2014   CLINICAL DATA:  80 year old male with headache and neck pain following fall.  EXAM: CT HEAD WITHOUT CONTRAST  CT CERVICAL SPINE WITHOUT CONTRAST  TECHNIQUE: Multidetector  CT imaging of the head and cervical spine was performed following the standard protocol without intravenous contrast. Multiplanar CT image reconstructions of the cervical spine were also generated.  COMPARISON:  10/31/2012 CT  FINDINGS: CT HEAD FINDINGS  Atrophy and chronic small-vessel white matter ischemic changes again noted.  No acute intracranial abnormalities are identified, including mass lesion or mass effect, hydrocephalus, extra-axial fluid collection, midline shift, hemorrhage, or acute infarction. The visualized bony calvarium is unremarkable.  CT CERVICAL SPINE FINDINGS  3 mm anterolisthesis of C5 on C6 and 1 mm anterolisthesis of C4 on C5 again noted.  There is no evidence of acute fracture or new subluxation.  No prevertebral soft tissue swelling is present.  Degenerative disc disease and spondylosis from C5-T1 again identified.  Moderate facet arthropathy throughout the cervical spine is unchanged.  No focal bony lesions are present.  IMPRESSION: No evidence of acute abnormality.  Atrophy and chronic small-vessel white matter ischemic changes.  Multilevel degenerative changes within the cervical spine again identified.   Electronically Signed   By: Hassan Rowan M.D.   On: 04/21/2014 20:06   Ct Cervical Spine Wo Contrast  04/21/2014   CLINICAL DATA:  80 year old male with headache and neck pain following fall.  EXAM: CT HEAD WITHOUT CONTRAST  CT CERVICAL SPINE WITHOUT CONTRAST  TECHNIQUE: Multidetector CT imaging of the head and cervical spine was performed following the standard protocol without intravenous contrast. Multiplanar CT image reconstructions of the cervical spine were also generated.  COMPARISON:  10/31/2012 CT  FINDINGS: CT HEAD FINDINGS  Atrophy and chronic small-vessel white matter ischemic changes again noted.  No acute intracranial abnormalities are identified, including mass lesion or mass effect, hydrocephalus, extra-axial fluid collection, midline shift, hemorrhage, or acute  infarction. The visualized bony calvarium is unremarkable.  CT CERVICAL SPINE FINDINGS  3 mm anterolisthesis of C5 on C6 and 1 mm anterolisthesis of C4 on C5 again noted.  There is no evidence of acute fracture or new subluxation.  No prevertebral soft tissue swelling is present.  Degenerative disc disease and spondylosis from C5-T1 again identified.  Moderate facet arthropathy throughout the cervical spine is unchanged.  No focal bony lesions are present.  IMPRESSION: No evidence of acute abnormality.  Atrophy and chronic small-vessel white matter ischemic changes.  Multilevel degenerative changes within the cervical spine again identified.   Electronically Signed   By: Hassan Rowan M.D.   On: 04/21/2014 20:06     Imaging Review Dg Chest 2 View  04/21/2014   CLINICAL DATA:  Cough  EXAM: CHEST  2 VIEW  COMPARISON:  03/04/2014  FINDINGS: Cardiomegaly noted. Coarsened interstitial markings are identified bilaterally without focal opacity. Minimal crowding at the lung bases is identified. No pleural effusion. No new parenchymal opacification. Mild kyphosis without compression deformity.  IMPRESSION: Chronically prominent interstitial markings without focal acute finding. Minimal crowding at the lung bases.   Electronically Signed   By: Conchita Paris M.D.   On: 04/21/2014 19:56   Ct Head Wo Contrast  04/21/2014   CLINICAL DATA:  80 year old male with headache and neck pain following fall.  EXAM: CT HEAD WITHOUT CONTRAST  CT CERVICAL SPINE WITHOUT CONTRAST  TECHNIQUE: Multidetector CT imaging of the head and cervical spine  was performed following the standard protocol without intravenous contrast. Multiplanar CT image reconstructions of the cervical spine were also generated.  COMPARISON:  10/31/2012 CT  FINDINGS: CT HEAD FINDINGS  Atrophy and chronic small-vessel white matter ischemic changes again noted.  No acute intracranial abnormalities are identified, including mass lesion or mass effect, hydrocephalus,  extra-axial fluid collection, midline shift, hemorrhage, or acute infarction. The visualized bony calvarium is unremarkable.  CT CERVICAL SPINE FINDINGS  3 mm anterolisthesis of C5 on C6 and 1 mm anterolisthesis of C4 on C5 again noted.  There is no evidence of acute fracture or new subluxation.  No prevertebral soft tissue swelling is present.  Degenerative disc disease and spondylosis from C5-T1 again identified.  Moderate facet arthropathy throughout the cervical spine is unchanged.  No focal bony lesions are present.  IMPRESSION: No evidence of acute abnormality.  Atrophy and chronic small-vessel white matter ischemic changes.  Multilevel degenerative changes within the cervical spine again identified.   Electronically Signed   By: Hassan Rowan M.D.   On: 04/21/2014 20:06   Ct Cervical Spine Wo Contrast  04/21/2014   CLINICAL DATA:  80 year old male with headache and neck pain following fall.  EXAM: CT HEAD WITHOUT CONTRAST  CT CERVICAL SPINE WITHOUT CONTRAST  TECHNIQUE: Multidetector CT imaging of the head and cervical spine was performed following the standard protocol without intravenous contrast. Multiplanar CT image reconstructions of the cervical spine were also generated.  COMPARISON:  10/31/2012 CT  FINDINGS: CT HEAD FINDINGS  Atrophy and chronic small-vessel white matter ischemic changes again noted.  No acute intracranial abnormalities are identified, including mass lesion or mass effect, hydrocephalus, extra-axial fluid collection, midline shift, hemorrhage, or acute infarction. The visualized bony calvarium is unremarkable.  CT CERVICAL SPINE FINDINGS  3 mm anterolisthesis of C5 on C6 and 1 mm anterolisthesis of C4 on C5 again noted.  There is no evidence of acute fracture or new subluxation.  No prevertebral soft tissue swelling is present.  Degenerative disc disease and spondylosis from C5-T1 again identified.  Moderate facet arthropathy throughout the cervical spine is unchanged.  No focal bony  lesions are present.  IMPRESSION: No evidence of acute abnormality.  Atrophy and chronic small-vessel white matter ischemic changes.  Multilevel degenerative changes within the cervical spine again identified.   Electronically Signed   By: Hassan Rowan M.D.   On: 04/21/2014 20:06     EKG Interpretation   Date/Time:  Saturday April 23 2014 12:12:59 EDT Ventricular Rate:  105 PR Interval:  176 QRS Duration: 72 QT Interval:  322 QTC Calculation: 425 R Axis:   53 Text Interpretation:  Sinus tachycardia Nonspecific ST and T wave  abnormality Confirmed by Ashok Cordia  MD, Ainsley Deakins ([id number]) on 04/23/2014 12:38:49 PM      MDM  Iv ns. Continuous pulse ox and monitor. o2 [place]. Labs. Cxr.  Reviewed nursing notes and prior charts for additional history.   Lasix iv. Albuterol and atrovent neb.  Med service contacted for admission.  Updated pt.  Went to update family, not in room x 3.  hospitalists admitting.     Mirna Mires, MD 04/23/14 [phone]

## 2014-04-24 ENCOUNTER — Inpatient Hospital Stay (HOSPITAL_COMMUNITY): Payer: Medicare Other

## 2014-04-24 DIAGNOSIS — F09 Unspecified mental disorder due to known physiological condition: Secondary | ICD-10-CM

## 2014-04-24 LAB — CBC
HCT: 41.7 % (ref 39.0–52.0)
Hemoglobin: 13.9 g/dL (ref 13.0–17.0)
MCH: 32.1 pg (ref 26.0–34.0)
MCHC: 33.3 g/dL (ref 30.0–36.0)
MCV: 96.3 fL (ref 78.0–100.0)
Platelets: 258 10*3/uL (ref 150–400)
RBC: 4.33 MIL/uL (ref 4.22–5.81)
RDW: 13.9 % (ref 11.5–15.5)
WBC: 11 10*3/uL — AB (ref 4.0–10.5)

## 2014-04-24 LAB — COMPREHENSIVE METABOLIC PANEL
ALBUMIN: 3.1 g/dL — AB (ref 3.5–5.2)
ALT: 26 U/L (ref 0–53)
AST: 27 U/L (ref 0–37)
Alkaline Phosphatase: 106 U/L (ref 39–117)
BUN: 19 mg/dL (ref 6–23)
CALCIUM: 9.3 mg/dL (ref 8.4–10.5)
CO2: 25 mEq/L (ref 19–32)
Chloride: 100 mEq/L (ref 96–112)
Creatinine, Ser: 0.8 mg/dL (ref 0.50–1.35)
GFR calc non Af Amer: 76 mL/min — ABNORMAL LOW (ref >90)
GFR, EST AFRICAN AMERICAN: 88 mL/min — AB (ref >90)
Glucose, Bld: 151 mg/dL — ABNORMAL HIGH (ref 70–99)
Potassium: 4.1 mEq/L (ref 3.7–5.3)
Sodium: 140 mEq/L (ref 137–147)
Total Bilirubin: 1.7 mg/dL — ABNORMAL HIGH (ref 0.3–1.2)
Total Protein: 7.6 g/dL (ref 6.0–8.3)

## 2014-04-24 LAB — PROTIME-INR
INR: 1.96 — AB (ref 0.00–1.49)
Prothrombin Time: 21.7 seconds — ABNORMAL HIGH (ref 11.6–15.2)

## 2014-04-24 MED ORDER — GUAIFENESIN 100 MG/5ML PO SYRP
200.0000 mg | ORAL_SOLUTION | ORAL | Status: DC | PRN
  Administered 2014-04-24: 200 mg via ORAL
  Filled 2014-04-24 (×2): qty 10

## 2014-04-24 MED ORDER — LEVALBUTEROL HCL 0.63 MG/3ML IN NEBU
0.6300 mg | INHALATION_SOLUTION | Freq: Four times a day (QID) | RESPIRATORY_TRACT | Status: DC
  Administered 2014-04-24 (×2): 0.63 mg via RESPIRATORY_TRACT
  Filled 2014-04-24 (×2): qty 3

## 2014-04-24 MED ORDER — LEVALBUTEROL HCL 0.63 MG/3ML IN NEBU: 0.6300 mg | INHALATION_SOLUTION | Freq: Four times a day (QID) | RESPIRATORY_TRACT | Status: DC | PRN

## 2014-04-24 MED ORDER — WARFARIN SODIUM 5 MG PO TABS
5.0000 mg | ORAL_TABLET | Freq: Once | ORAL | Status: AC
  Administered 2014-04-24: 5 mg via ORAL
  Filled 2014-04-24: qty 1

## 2014-04-24 NOTE — Progress Notes (Signed)
[facility] PROGRESS NOTE  Howard Payne YPP:[id number] DOB: [birth year] DOA: 04/23/2014  PCP: Nyoka Cowden, MD  Brief HPI: Howard Payne is a 80 y.o. male with a past medical history of skin cancer and atrial fibrillation on Coumadin, hypertension, mild dementia who presented with shortness of breath that started about 1 day prior to admission. He was also brought in earlier this week for falls. He was thought to have bronchitis and was admitted to the hospital.   Past medical history:  Past Medical History  Diagnosis Date  . Irregular heart rate   . Macular degeneration   . Cognitive impairment   . Hypotension   . 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 Glaucoma   . Atrial fibrillation   . Dysrhythmia   . GERD (gastroesophageal reflux disease)     Consultants: None  Procedures: None  Antibiotics: Levaquin 6/6-->  Subjective: Patient states he is not much better. Still quite short of breath. Says his nose is stuffed.  Objective: Vital Signs  Filed Vitals:   04/24/14 0213 04/24/14 0515 04/24/14 0935 04/24/14 0950  BP: 158/78 128/85 145/91   Pulse: 99 111 113   Temp: 97.7 F (36.5 C) 98.4 F (36.9 C) 97.8 F (36.6 C)   TempSrc:   Oral   Resp: 22 20 21    Height:      Weight:      SpO2: 98% 97% 99% 98%    Intake/Output Summary (Last 24 hours) at 04/24/14 0959 Last data filed at 04/24/14 0900  Gross per 24 hour  Intake   1200 ml  Output    600 ml  Net    600 ml   Filed Weights   04/23/14 2034  Weight: 82.6 kg (182 lb 1.6 oz)    General appearance: alert, appears stated age and no distress Head: Normocephalic, without obvious abnormality, atraumatic Resp: diffuse end expiratory wheezing bilaterally. Few crackles at bases. Cardio: regular rate and rhythm, S1, S2 normal, no murmur, click, rub or gallop GI: soft, non-tender; bowel sounds  normal; no masses,  no organomegaly Extremities: extremities normal, atraumatic, no cyanosis or edema Neurologic: No focal deficits. Slightly disoriented.  Lab Results:  Basic Metabolic Panel:  Recent Labs Lab 04/21/14 2012 04/23/14 1250 04/24/14 0320  NA 140 136* 140  K 4.1 4.2 4.1  CL 98 98 100  CO2  --  20 25  GLUCOSE 162* 129* 151*  BUN 18 16 19   CREATININE 1.00 0.81 0.80  CALCIUM  --  9.3 9.3   Liver Function Tests:  Recent Labs Lab 04/23/14 1250 04/24/14 0320  AST 36 27  ALT 25 26  ALKPHOS 104 106  BILITOT 2.1* 1.7*  PROT 8.2 7.6  ALBUMIN 3.6 3.1*   CBC:  Recent Labs Lab 04/21/14 2004 04/21/14 2012 04/23/14 1250 04/24/14 0320  WBC 10.6*  --  12.9* 11.0*  NEUTROABS 8.4*  --   --   --   HGB 13.7 15.3 14.5 13.9  HCT 41.1 45.0 43.9 41.7  MCV 96.9  --  97.3 96.3  PLT 232  --  245 258   Cardiac Enzymes:  Recent Labs Lab 04/23/14 1250  TROPONINI <0.30   BNP (last 3 results)  Recent Labs  04/23/14 1250  PROBNP 841.6*    Studies/Results: Dg Chest 2 View  04/23/2014   CLINICAL DATA:  Wheezing and shortness of  breath  EXAM: CHEST  2 VIEW  COMPARISON:  April 21, 2014  FINDINGS: There is underlying emphysematous change. There is persistent interstitial prominence, primarily in the lung bases but also present in the mid lung regions. There is no well-defined consolidation. There is cardiomegaly. The pulmonary vascularity suggests mild pulmonary venous hypertension. No adenopathy. There is atherosclerotic change in the aorta. No bone lesions are appreciated. There is degenerative change in the thoracic spine.  IMPRESSION: Findings felt to represent chronic congestive heart failure superimposed on emphysematous change. No consolidation.   Electronically Signed   By: Lowella Grip M.D.   On: 04/23/2014 14:07    Medications:  Scheduled: . albuterol  2.5 mg Nebulization Q6H  . antiseptic oral rinse  15 mL Mouth Rinse q12n4p  . artificial tears   Left Eye  3 times per day  . chlorhexidine  15 mL Mouth Rinse BID  . diclofenac sodium  2 g Topical BID  . digoxin  0.125 mg Oral Daily  . ipratropium  0.5 mg Nebulization Q6H  . latanoprost  1 drop Both Eyes QHS  . levofloxacin (LEVAQUIN) IV  750 mg Intravenous Q24H  . Memantine HCl ER  28 mg Oral Daily  . methylPREDNISolone (SOLU-MEDROL) injection  60 mg Intravenous Q12H  . pantoprazole  40 mg Oral Daily  . sertraline  25 mg Oral Daily  . sulfacetamide  2 drop Left Eye QID  . tobramycin  1 drop Both Eyes 6 times per day  . trimethoprim-polymyxin b  2 drop Left Eye QID  . warfarin  5 mg Oral ONCE-1800  . Warfarin - Pharmacist Dosing Inpatient   Does not apply q1800   Continuous:  NTZ:[id number], acetaminophen, albuterol, ondansetron (ZOFRAN) IV, ondansetron, polyethylene glycol, polyvinyl alcohol  Assessment/Plan:  Principal Problem:   Acute respiratory failure Active Problems:   Chronic atrial fibrillation   Chronic anticoagulation   Acute bronchitis    Acute respiratory failure/ Acute bronchitis:  Appears to be predominantly reactive airway disease but cannot rule out some degree of volume overload. IVF stopped. Will repeat CXR today. Continue nebs, steroids, Abx. Add antitussive agent. [facility] report from April reviewed. No LV dysfunction noted. Could still have diastolic dysfunction.   Mild Hyponatremia Resolved.   Chronic atrial fibrillation on Chronic anticoagulation  Continue current home meds including Digoxin. Change to Xopenex nebs to prevent excessive tachycardia. Pharmacy managing warfarin.  Frequent Falls this week PT/OT eval is pending. No neurological deficits. Concerning as he is on warfarin.   Dementia We'll continue his current home meds. Seems to be stable.   Code Status: DNR  DVT Prophylaxis: On warfarin    Family Communication: No family at bedside.  Disposition Plan: Apparently from ALF. PT/OT to see. Not ready for discharge.    LOS: 1 day   [facility] Pager [phone] 04/24/2014, 9:59 AM  If 8PM-8AM, please contact night-coverage at [email or web address], password [email or web address]   Disclaimer: This note was dictated with voice recognition software. Similar sounding words can inadvertently be transcribed and may not be corrected upon review.

## 2014-04-24 NOTE — Progress Notes (Signed)
ANTICOAGULATION CONSULT NOTE - Follow-Up  Consult  Pharmacy Consult for warfarin Indication: atrial fibrillation  No Known Allergies  Patient Measurements: Height: 6\' 3"  (190.5 cm) Weight: 182 lb 1.6 oz (82.6 kg) IBW/kg (Calculated) : 84.5   Vital Signs: Temp: 97.8 F (36.6 C) (06/07 0935) Temp src: Oral (06/07 0935) BP: 145/Howard mmHg (06/07 0935) Pulse Rate: 113 (06/07 0935)  Labs:  Recent Labs  04/21/14 2004 04/21/14 2012 04/21/14 2109 04/23/14 1250 04/24/14 0320  HGB 13.7 15.3  --  14.5 13.9  HCT 41.1 45.0  --  43.9 41.7  PLT 232  --   --  245 258  LABPROT  --   --  22.5* 20.9* 21.7*  INR  --   --  2.05* 1.86* 1.96*  CREATININE  --  1.00  --  0.81 0.80  TROPONINI  --   --   --  <0.30  --     Estimated Creatinine Clearance: 70.3 ml/min (by C-G formula based on Cr of 0.8).   Medical History: Past Medical History  Diagnosis Date  . Irregular heart rate   . Macular degeneration   . Cognitive impairment   . Hypotension   . 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 Glaucoma   . Atrial fibrillation   . Dysrhythmia   . GERD (gastroesophageal reflux disease)     Assessment: Howard Payne on chronic warfarin for AFib, admitted with SOB and started on levofloxacin which can potentiate INR. Today's  INR is 1.96. CBC stable - hgb 13.9, plts 245- no bleeding noted. Home dose of warfarin 2.5mg  daily except 5mg  on Tuesday and Saturdays.  Goal of Therapy:  INR 2-3 Monitor platelets by anticoagulation protocol: Yes   Plan:  1. Warfarin 5mg  po x1 tonight with subtherapeutic INR.  Will likely be able to resume home dose tomorrow. 2. Daily PT/INR- follow closely with levofloxacin 3. Follow for s/s bleeding  Uvaldo Rising, BCPS  Clinical Pharmacist Pager [phone]  04/24/2014 10:09 AM

## 2014-04-24 NOTE — Evaluation (Signed)
Physical Therapy Evaluation Patient Details Name: Howard Payne MRN: [id number] DOB: 1920-01-13 Today's Date: 04/24/2014   History of Present Illness  pt is a 80 yo male with a Past medical history of skin cancer and atrial fibrillation on Coumadin, hypertension, mild dementia that comes in for shortness of breath that started about 1 days prior to admission. As per family member he was brought to the ED 2 days prior to admission for fall and was sent back to his assisted living facility. Since then he's been getting mildly short of breath with some cough progressively getting worse. To the point he's not eating and slightly confused.   Clinical Impression   Pt admitted with above. Pt currently with functional limitations due to the deficits listed below (see PT Problem List).  Pt will benefit from skilled PT to increase their independence and safety with mobility to allow discharge to the venue listed below.   Pt isn't the best historian, and we need to get a better picture of his prior level of function, and the amount of assist available to him at his ALF; If his needs can be met at his ALF, I tend to favor dc'ing back to his familiar environment; If not, we must consider postacute rehab to maximize independence and safety with mobility       Follow Up Recommendations Other (comment) (We must consider postacute rehab)    Equipment Recommendations  Rolling walker with 5" wheels;3in1 (PT)    Recommendations for Other Services OT consult     Precautions / Restrictions Precautions Precautions: Fall Precaution Comments: decr vision Restrictions Weight Bearing Restrictions: No      Mobility  Bed Mobility Overal bed mobility: Needs Assistance Bed Mobility: Supine to Sit     Supine to sit: Mod assist     General bed mobility comments: Mod assist to pull to sit; pt able to clear LEs off side of bed with cues, but no need for LE assist  Transfers Overall transfer level: Needs  assistance Equipment used: 2 person hand held assist Transfers: Sit to/from Stand;Stand Pivot Transfers Sit to Stand: +2 physical assistance;Mod assist Stand pivot transfers: +2 physical assistance;Mod assist       General transfer comment: Cues for technique and bilateral steadying assist given for sit to stand; Good contro of stand to sit with cues for safety; Able to take small steps to chair on pt's R  Ambulation/Gait             General Gait Details: Did not walk a long enough distance for appreciable gait analysis  Stairs            Wheelchair Mobility    Modified Rankin (Stroke Patients Only)       Balance Overall balance assessment: Needs assistance Sitting-balance support: Feet supported;Single extremity supported;Bilateral upper extremity supported Sitting balance-Leahy Scale: Good Sitting balance - Comments: Able to reciprocally scoot hips to spuare off at EOB without physical assist   Standing balance support: Bilateral upper extremity supported;During functional activity Standing balance-Leahy Scale: Poor                               Pertinent Vitals/Pain no apparent distress     Home Living Family/patient expects to be discharged to:: Assisted living               Home Equipment: Other (comment) (difficult to ascertain from pt) Additional Comments: Need mroe reliable info re:  pt's PLOF and level of assist available to him at Assisted living    Prior Function Level of Independence: Needs assistance         Comments: Inferred need for assist, but unable to ascertain PLOF from pt     Hand Dominance        Extremity/Trunk Assessment   Upper Extremity Assessment: Defer to OT evaluation           Lower Extremity Assessment: Generalized weakness         Communication   Communication: HOH;Expressive difficulties;Other (comment) (hoarse voice, at times difficult to understand)  Cognition Arousal/Alertness:  Lethargic Behavior During Therapy: WFL for tasks assessed/performed Overall Cognitive Status: No family/caregiver present to determine baseline cognitive functioning                      General Comments      Exercises        Assessment/Plan    PT Assessment Patient needs continued PT services  PT Diagnosis Difficulty walking;Generalized weakness   PT Problem List Decreased strength;Decreased activity tolerance;Decreased balance;Decreased mobility;Decreased coordination;Decreased cognition;Decreased knowledge of use of DME;Decreased safety awareness  PT Treatment Interventions DME instruction;Gait training;Functional mobility training;Therapeutic activities;Therapeutic exercise;Balance training;Neuromuscular re-education;Patient/family education   PT Goals (Current goals can be found in the Care Plan section) Acute Rehab PT Goals Patient Stated Goal: Did not state, but agreeable to getting up and OOB PT Goal Formulation: Patient unable to participate in goal setting Time For Goal Achievement: 05/08/14 Potential to Achieve Goals: Good    Frequency Min 3X/week   Barriers to discharge Decreased caregiver support Need more info re: level of assist available to pt at his ALF; if unable to provide necessary assist, we must consider  postacute rehab    Co-evaluation               End of Session Equipment Utilized During Treatment: Gait belt Activity Tolerance: Patient tolerated treatment well Patient left: in chair;with call bell/phone within reach;with chair alarm set Nurse Communication: Mobility status         Time: 0921-0940 PT Time Calculation (min): 19 min   Charges:   PT Evaluation $Initial PT Evaluation Tier I: 1 Procedure PT Treatments $Therapeutic Activity: 8-22 mins   PT G Codes:          [facility] [place] 04/24/2014, 1:31 PM  Roney Marion, [place] Pager [phone] Office [phone]

## 2014-04-25 ENCOUNTER — Encounter: Payer: Self-pay | Admitting: Internal Medicine

## 2014-04-25 LAB — BASIC METABOLIC PANEL
BUN: 33 mg/dL — AB (ref 6–23)
CO2: 24 meq/L (ref 19–32)
Calcium: 9.9 mg/dL (ref 8.4–10.5)
Chloride: 99 mEq/L (ref 96–112)
Creatinine, Ser: 0.98 mg/dL (ref 0.50–1.35)
GFR calc Af Amer: 81 mL/min — ABNORMAL LOW (ref >90)
GFR, EST NON AFRICAN AMERICAN: 70 mL/min — AB (ref >90)
GLUCOSE: 127 mg/dL — AB (ref 70–99)
Potassium: 5.1 mEq/L (ref 3.7–5.3)
Sodium: 140 mEq/L (ref 137–147)

## 2014-04-25 LAB — CBC
HEMATOCRIT: 42.3 % (ref 39.0–52.0)
HEMOGLOBIN: 14.2 g/dL (ref 13.0–17.0)
MCH: 32.3 pg (ref 26.0–34.0)
MCHC: 33.6 g/dL (ref 30.0–36.0)
MCV: 96.4 fL (ref 78.0–100.0)
Platelets: 287 10*3/uL (ref 150–400)
RBC: 4.39 MIL/uL (ref 4.22–5.81)
RDW: 13.9 % (ref 11.5–15.5)
WBC: 18.8 10*3/uL — ABNORMAL HIGH (ref 4.0–10.5)

## 2014-04-25 LAB — PROTIME-INR
INR: 3.06 — ABNORMAL HIGH (ref 0.00–1.49)
Prothrombin Time: 30.5 seconds — ABNORMAL HIGH (ref 11.6–15.2)

## 2014-04-25 MED ORDER — QUETIAPINE 12.5 MG HALF TABLET
12.5000 mg | ORAL_TABLET | Freq: Every day | ORAL | Status: DC
  Administered 2014-04-25: 12.5 mg via ORAL
  Filled 2014-04-25 (×3): qty 1

## 2014-04-25 MED ORDER — IPRATROPIUM BROMIDE 0.02 % IN SOLN: 0.5000 mg | Freq: Four times a day (QID) | RESPIRATORY_TRACT | Status: DC | PRN

## 2014-04-25 MED ORDER — PREDNISONE 50 MG PO TABS
60.0000 mg | ORAL_TABLET | Freq: Every day | ORAL | Status: DC
  Administered 2014-04-26: 60 mg via ORAL
  Filled 2014-04-25 (×2): qty 1

## 2014-04-25 MED ORDER — HALOPERIDOL LACTATE 5 MG/ML IJ SOLN
2.5000 mg | Freq: Four times a day (QID) | INTRAMUSCULAR | Status: DC | PRN
Start: 2014-04-25 — End: 2014-04-26
  Administered 2014-04-25: 2.5 mg via INTRAVENOUS
  Filled 2014-04-25: qty 1

## 2014-04-25 MED ORDER — LEVOFLOXACIN 750 MG PO TABS
750.0000 mg | ORAL_TABLET | Freq: Every day | ORAL | Status: DC
  Administered 2014-04-25 – 2014-04-26 (×2): 750 mg via ORAL
  Filled 2014-04-25 (×2): qty 1

## 2014-04-25 MED ORDER — GLUCERNA SHAKE PO LIQD
237.0000 mL | Freq: Three times a day (TID) | ORAL | Status: DC
  Administered 2014-04-25 – 2014-04-26 (×3): 237 mL via ORAL

## 2014-04-25 NOTE — Progress Notes (Signed)
[facility] PROGRESS NOTE  Howard Payne ELF:[id number] DOB: 1920-10-08 DOA: 04/23/2014  PCP: Nyoka Cowden, MD  Brief HPI: Howard Payne is a 80 y.o. male with a past medical history of skin cancer and atrial fibrillation on Coumadin, hypertension, mild dementia who presented with shortness of breath that started about 1 day prior to admission. He was also brought in earlier this week for falls. He was thought to have bronchitis and was admitted to the hospital.   Past medical history:  Past Medical History  Diagnosis Date  . Irregular heart rate   . Macular degeneration   . Cognitive impairment   . Hypotension   . 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 Glaucoma   . Atrial fibrillation   . Dysrhythmia   . GERD (gastroesophageal reflux disease)     Consultants: None  Procedures: None  Antibiotics: Levaquin 6/6-->  Subjective: Patient feels better today. Was agitated overnight. Daughter at bedside today.   Objective: Vital Signs  Filed Vitals:   04/24/14 2012 04/24/14 2103 04/25/14 0605 04/25/14 1015  BP:  120/66 139/83 123/78  Pulse:  120 114 119  Temp:  98.5 F (36.9 C) 98.3 F (36.8 C) 98.2 F (36.8 C)  TempSrc:  Oral Oral Oral  Resp:  20 18 22   Height:      Weight:  82 kg (180 lb 12.4 oz)    SpO2: 99% 97% 96% 97%    Intake/Output Summary (Last 24 hours) at 04/25/14 1249 Last data filed at 04/25/14 1117  Gross per 24 hour  Intake     60 ml  Output    375 ml  Net   -315 ml   Filed Weights   04/23/14 2034 04/24/14 2103  Weight: 82.6 kg (182 lb 1.6 oz) 82 kg (180 lb 12.4 oz)    General appearance: alert, appears stated age and no distress Resp: Much improved air entry bilaterally. Few crackles at bases. Cardio: regular rate and rhythm, S1, S2 normal, no murmur, click, rub or gallop GI: soft, non-tender; bowel sounds normal; no  masses,  no organomegaly Extremities: extremities normal, atraumatic, no cyanosis or edema Neurologic: No focal deficits. Disoriented.   Lab Results:  Basic Metabolic Panel:  Recent Labs Lab 04/21/14 2012 04/23/14 1250 04/24/14 0320 04/25/14 0532  NA 140 136* 140 140  K 4.1 4.2 4.1 5.1  CL 98 98 100 99  CO2  --  20 25 24   GLUCOSE 162* 129* 151* 127*  BUN 18 16 19  33*  CREATININE 1.00 0.81 0.80 0.98  CALCIUM  --  9.3 9.3 9.9   Liver Function Tests:  Recent Labs Lab 04/23/14 1250 04/24/14 0320  AST 36 27  ALT 25 26  ALKPHOS 104 106  BILITOT 2.1* 1.7*  PROT 8.2 7.6  ALBUMIN 3.6 3.1*   CBC:  Recent Labs Lab 04/21/14 2004 04/21/14 2012 04/23/14 1250 04/24/14 0320 04/25/14 0532  WBC 10.6*  --  12.9* 11.0* 18.8*  NEUTROABS 8.4*  --   --   --   --   HGB 13.7 15.3 14.5 13.9 14.2  HCT 41.1 45.0 43.9 41.7 42.3  MCV 96.9  --  97.3 96.3 96.4  PLT 232  --  245 258 287   Cardiac Enzymes:  Recent Labs Lab 04/23/14 1250  TROPONINI <0.30   BNP (last 3 results)  Recent Labs  04/23/14  1250  PROBNP 841.6*    Studies/Results: Dg Chest 2 View  04/23/2014   CLINICAL DATA:  Wheezing and shortness of breath  EXAM: CHEST  2 VIEW  COMPARISON:  April 21, 2014  FINDINGS: There is underlying emphysematous change. There is persistent interstitial prominence, primarily in the lung bases but also present in the mid lung regions. There is no well-defined consolidation. There is cardiomegaly. The pulmonary vascularity suggests mild pulmonary venous hypertension. No adenopathy. There is atherosclerotic change in the aorta. No bone lesions are appreciated. There is degenerative change in the thoracic spine.  IMPRESSION: Findings felt to represent chronic congestive heart failure superimposed on emphysematous change. No consolidation.   Electronically Signed   By: Lowella Grip M.D.   On: 04/23/2014 14:07   Dg Chest Port 1 View  04/24/2014   CLINICAL DATA:  Shortness of breath.   EXAM: PORTABLE CHEST - 1 VIEW  COMPARISON:  Chest x-ray 04/23/2014.  FINDINGS: Compared with yesterday's examination, the degree of cephalization of the pulmonary vasculature, and indistinctness of interstitial markings appear slightly improved, likely to reflect some resolving interstitial pulmonary edema. No confluent consolidative airspace disease. No pleural effusions. Heart size remains mildly enlarged. The patient is rotated to the right on today's exam, resulting in distortion of the mediastinal contours and reduced diagnostic sensitivity and specificity for mediastinal pathology. Atherosclerosis in the thoracic aorta.  IMPRESSION: 1. Overall, there is improvement compared with yesterday's examination suggesting resolving congestive heart failure. 2. Atherosclerosis.   Electronically Signed   By: Vinnie Langton M.D.   On: 04/24/2014 13:41    Medications:  Scheduled: . antiseptic oral rinse  15 mL Mouth Rinse q12n4p  . artificial tears   Left Eye 3 times per day  . chlorhexidine  15 mL Mouth Rinse BID  . diclofenac sodium  2 g Topical BID  . digoxin  0.125 mg Oral Daily  . latanoprost  1 drop Both Eyes QHS  . levofloxacin  750 mg Oral Daily  . Memantine HCl ER  28 mg Oral Daily  . pantoprazole  40 mg Oral Daily  . [START ON 04/26/2014] predniSONE  60 mg Oral Q breakfast  . sertraline  25 mg Oral Daily  . sulfacetamide  2 drop Left Eye QID  . tobramycin  1 drop Both Eyes 6 times per day  . trimethoprim-polymyxin b  2 drop Left Eye QID  . Warfarin - Pharmacist Dosing Inpatient   Does not apply q1800   Continuous:  ZCH:YIFOYDXAJOINO, acetaminophen, guaifenesin, ipratropium, levalbuterol, ondansetron (ZOFRAN) IV, ondansetron, polyethylene glycol, polyvinyl alcohol  Assessment/Plan:  Principal Problem:   Acute respiratory failure Active Problems:   Chronic atrial fibrillation   Chronic anticoagulation   Acute bronchitis    Acute respiratory failure/ Acute bronchitis  Improved.  Appears to be predominantly reactive airway disease with some degree of volume overload. Repeat CXR shows improvement. Taper steroids. PRN Nebs, Change to oral antibiotics. ECHO report from April reviewed. No LV dysfunction noted. Could still have diastolic dysfunction.   Mild Hyponatremia Resolved.   Chronic atrial fibrillation on Chronic anticoagulation  Continue current home meds including Digoxin. Rate was high earlier today. But better now. Pharmacy managing warfarin.  Frequent Falls this week PT/OT recommend CIR. Rehab consult is pending. Concerning as he is on warfarin.   Dementia We'll continue his current home meds. Seems to be stable.   Code Status: DNR  DVT Prophylaxis: On warfarin    Family Communication: Discussed with daughter. Disposition Plan: Apparently from ALF.  Needs CIR or SNF. Possible DC 6/9.    LOS: 2 days   [facility] Pager [phone] 04/25/2014, 12:49 PM  If 8PM-8AM, please contact night-coverage at [email or web address], password [email or web address]   Disclaimer: This note was dictated with voice recognition software. Similar sounding words can inadvertently be transcribed and may not be corrected upon review.

## 2014-04-25 NOTE — Evaluation (Signed)
Occupational Therapy Evaluation Patient Details Name: Howard Payne MRN: [id number] DOB: 27-Feb-1920 Today's Date: 04/25/2014    History of Present Illness pt is a 80 yo male with a Past medical history of skin cancer and atrial fibrillation on Coumadin, hypertension, mild dementia that comes in for shortness of breath that started about 1 days prior to admission. As per family member he was brought to the ED 2 days prior to admission for fall and was sent back to his assisted living facility. Since then he's been getting mildly short of breath with some cough progressively getting worse. To the point he's not eating and slightly confused.    Clinical Impression   Pt demonstrates decline in function with ADLs and ADL mobility safety with decreased strength, balance, endurance and hx of cognitive deficits. Pt would benefit from acute OT services to address impairments to increase level of function and safety. Pt is a poor historian stating that he did no tuse an AD for mobility and that he was independent with ADLs, however pt's daughter able to provide accurate PLOF (pt requires assist with ADLs and uses RW and w/c at ALF). Pt's daughter concerned about pt being on rehab unit or at SNF for rehab and becoming disoriented due to hx of dementia, but understands that the need for more intensive therapies that [facility] OT/PT can provide. She plans to speak with her siblings about d/c plans/venue    Follow Up Recommendations  CIR;Supervision/Assistance - 24 hour (uncertain if ALF can provide current level of care)   Equipment Recommendations   TBD at next venue of care   Recommendations for Other Services       Precautions / Restrictions Precautions Precaution Comments: decreased vision, macular degeneration Restrictions Weight Bearing Restrictions: No      Mobility Bed Mobility Overal bed mobility: Needs Assistance Bed Mobility: Supine to Sit;Sit to Supine     Supine to sit: Min assist Sit to  supine: Min assist      Transfers Overall transfer level: Needs assistance Equipment used: 1 person hand held assist Transfers: Sit to/from Stand Sit to Stand: Mod assist         General transfer comment: cues for safety and technique    Balance Overall balance assessment: Needs assistance Sitting-balance support: No upper extremity supported;Feet supported Sitting balance-Leahy Scale: Good     Standing balance support: Bilateral upper extremity supported;During functional activity;Single extremity supported Standing balance-Leahy Scale: Poor                              ADL Overall ADL's : Needs assistance/impaired     Grooming: Wash/dry hands;Wash/dry face;Minimal assistance;Standing   Upper Body Bathing: Set up;Min guard;Sitting   Lower Body Bathing: Maximal assistance;Sitting/lateral leans;Sit to/from stand Lower Body Bathing Details (indicate cue type and reason): Poor standing balance Upper Body Dressing : Min guard;Set up;Sitting   Lower Body Dressing: Maximal assistance;Total assistance;Sitting/lateral leans;Sit to/from stand Lower Body Dressing Details (indicate cue type and reason): Poor standing balance Toilet Transfer: Moderate assistance;Comfort height toilet;Ambulation;Cueing for safety Toilet Transfer Details (indicate cue type and reason): cues for safety and technique Toileting- Clothing Manipulation and Hygiene: Moderate assistance;Sit to/from stand       Functional mobility during ADLs: Moderate assistance;Cueing for safety General ADL Comments: Poor standing balance for LB ADLs and ADL mobility, cues for safety     Vision  wears glasses, hx vision deficits (macular degeneration)  Perception Perception Perception Tested?: No   Praxis Praxis Praxis tested?: Not tested    Pertinent Vitals/Pain No c/o pain, VSS     Hand Dominance Left   Extremity/Trunk Assessment Upper Extremity Assessment Upper  Extremity Assessment: Generalized weakness   Lower Extremity Assessment Lower Extremity Assessment: Defer to PT evaluation       Communication Communication Communication: HOH   Cognition Arousal/Alertness: Awake/alert Behavior During Therapy: Anxious Overall Cognitive Status: History of cognitive impairments - at baseline       Memory: Decreased short-term memory             General Comments   Pt pleasant and cooperative, anxious to leave hospital                 Home Living Family/patient expects to be discharged to:: Assisted living                             Home Equipment: Walker - 2 wheels   Additional Comments: Pt states that he does not use an AD for walking and is independnet with ADLs at ALF, however pt's daughter states that he did and sometimes a w/c lately due to weakness      Prior Functioning/Environment Level of Independence: Needs assistance  Gait / Transfers Assistance Needed: RW, w/c per pt's daughter ADL's / Homemaking Assistance Needed: Pt's daughter states that ALF staff assist pt with min A forr bathing and dressing        OT Diagnosis: Generalized weakness   OT Problem List: Decreased strength;Decreased knowledge of use of DME or AE;Impaired vision/perception;Decreased knowledge of precautions;Decreased activity tolerance;Decreased cognition;Decreased safety awareness;Impaired balance (sitting and/or standing)   OT Treatment/Interventions: Self-care/ADL training;Therapeutic exercise;Patient/family education;Neuromuscular education;Balance training;Therapeutic activities;DME and/or AE instruction    OT Goals(Current goals can be found in the care plan section) Acute Rehab OT Goals Patient Stated Goal: to go back to ALF OT Goal Formulation: With patient/family Time For Goal Achievement: 05/02/14 Potential to Achieve Goals: Good ADL Goals Pt Will Perform Grooming: with min guard assist;with supervision;standing Pt Will  Perform Upper Body Bathing: with set-up;with modified independence;sitting Pt Will Perform Lower Body Bathing: with mod assist;sit to/from stand;sitting/lateral leans Pt Will Perform Upper Body Dressing: with set-up;with supervision;sitting Pt Will Transfer to Toilet: with min assist;with min guard assist;grab bars;ambulating Pt Will Perform Toileting - Clothing Manipulation and hygiene: with min assist;sit to/from stand  OT Frequency: Min 2X/week   Barriers to D/C: Decreased caregiver support  uncertain if ALF can provide current level of care                     End of Session Equipment Utilized During Treatment: Gait belt  Activity Tolerance: Patient tolerated treatment well Patient left: in bed;with call bell/phone within reach;with bed alarm set;with family/visitor present   Time: 8101-7510 OT Time Calculation (min): 32 min Charges:  OT General Charges $OT Visit: 1 Procedure OT Evaluation $Initial OT Evaluation Tier I: 1 Procedure OT Treatments $Therapeutic Activity: 8-22 mins G-Codes:    Mosetta Putt 2014/05/07, 12:42 PM

## 2014-04-25 NOTE — Progress Notes (Signed)
INITIAL NUTRITION ASSESSMENT  DOCUMENTATION CODES Per approved criteria  -Not Applicable   INTERVENTION: - Glucerna Shakes TID, each provides 220 kcal, 10 g protein - Please assist patient with all meals to improve PO intake.   NUTRITION DIAGNOSIS: Inadequate oral intake related to decreased appetite as evidenced by PO intake <50% of meals.   Goal: Patient will meet >/=90% of estimated nutrition needs  Monitor:  PO intake, weight, labs, I/Os  Reason for Assessment: Malnutrition screening tool  80 y.o. male  Admitting Dx: Acute respiratory failure  ASSESSMENT: 80 year old male patient with history of skin cancer, HTN, mild dementia, admitted with shortness of breath.   -Patient has not been eating well since 2 days prior to admission. Per daughter who is present in the room, patient typically eats well at SNF, but requires assistance due to macular degeneration.  -Patient has lost about 5% of his usual weight in 5 months. However, no signs of significant muscle and subcutaneous fat wasting.   Height: Ht Readings from Last 1 Encounters:  04/23/14 6\' 3"  (1.905 m)    Weight: Wt Readings from Last 1 Encounters:  04/24/14 180 lb 12.4 oz (82 kg)    Ideal Body Weight: 196 pounds  % Ideal Body Weight: 92@  Wt Readings from Last 10 Encounters:  04/24/14 180 lb 12.4 oz (82 kg)  04/19/14 193 lb (87.544 kg)  03/04/14 200 lb (90.719 kg)  11/29/13 190 lb (86.183 kg)  07/01/13 192 lb (87.091 kg)  04/15/13 186 lb (84.369 kg)  01/13/13 185 lb (83.915 kg)  01/08/13 183 lb (83.008 kg)  09/15/12 188 lb (85.276 kg)  05/18/12 188 lb (85.276 kg)    Usual Body Weight: 190 pounds  % Usual Body Weight: 95%  BMI:  Body mass index is 22.6 kg/(m^2). Patient is normal weight.   Estimated Nutritional Needs: Kcal: 1850-2050 kcal Protein: 100-115 g Fluid: >2.5 L/day  Skin: Intact  Diet Order:  Heart healthy, carbohydrate modified  EDUCATION NEEDS: -No education needs  identified at this time   Intake/Output Summary (Last 24 hours) at 04/25/14 1523 Last data filed at 04/25/14 1506  Gross per 24 hour  Intake    240 ml  Output    525 ml  Net   -285 ml    Last BM: PTA   Labs:   Recent Labs Lab 04/23/14 1250 04/24/14 0320 04/25/14 0532  NA 136* 140 140  K 4.2 4.1 5.1  CL 98 100 99  CO2 20 25 24   BUN 16 19 33*  CREATININE 0.81 0.80 0.98  CALCIUM 9.3 9.3 9.9  GLUCOSE 129* 151* 127*    CBG (last 3)  No results found for this basename: GLUCAP,  in the last 72 hours  Scheduled Meds: . antiseptic oral rinse  15 mL Mouth Rinse q12n4p  . artificial tears   Left Eye 3 times per day  . chlorhexidine  15 mL Mouth Rinse BID  . diclofenac sodium  2 g Topical BID  . digoxin  0.125 mg Oral Daily  . latanoprost  1 drop Both Eyes QHS  . levofloxacin  750 mg Oral Daily  . Memantine HCl ER  28 mg Oral Daily  . pantoprazole  40 mg Oral Daily  . [START ON 04/26/2014] predniSONE  60 mg Oral Q breakfast  . sertraline  25 mg Oral Daily  . sulfacetamide  2 drop Left Eye QID  . tobramycin  1 drop Both Eyes 6 times per day  . trimethoprim-polymyxin  b  2 drop Left Eye QID  . Warfarin - Pharmacist Dosing Inpatient   Does not apply q1800    Continuous Infusions:   Past Medical History  Diagnosis Date  . Irregular heart rate   . Macular degeneration   . Cognitive impairment   . Hypotension   . 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 Glaucoma   . Atrial fibrillation   . Dysrhythmia   . GERD (gastroesophageal reflux disease)     Past Surgical History  Procedure Laterality Date  . Mohs surgery  2006, 07/06/12    Left Lower Eyelid: Invasive Squamous Cell Carcinoma  . Shave biopsy  05/14/12    Left Lower Forehead  . Skin punch biopsy  05/14/12    Mid Left Lower Eyelid  . Eye surgery      Larey Seat, RD, LDN Pager #: [phone] After-Hours  Pager #: [phone]

## 2014-04-25 NOTE — Progress Notes (Signed)
Rehab Admissions Coordinator Note:  Patient was screened by Quentin Mulling Alekxander Isola for appropriateness for an Inpatient Acute Rehab Consult.  At this time, we are recommending Inpatient Rehab consult.  Ginna Schuur L Kirkland Figg, PT 04/25/2014, 8:34 AM  I can be reached at [phone].

## 2014-04-25 NOTE — Progress Notes (Signed)
Pt most appropriate for SNF level rehab at d/c when medically ready. Not an inpt rehab candidate. SW is aware. [phone]

## 2014-04-25 NOTE — Clinical Social Work Psychosocial (Signed)
Clinical Social Work Department BRIEF PSYCHOSOCIAL ASSESSMENT 04/25/2014  Patient:  Howard Payne,Howard Payne     Account Number:  [id number]     [place] date:  04/23/2014  Clinical Social Worker:  Frederico Hamman  Date/Time:  04/25/2014 05:12 AM  Referred by:  Physician  Date Referred:  04/25/2014 Referred for  SNF Placement   Other Referral:   Interview type:  Family Other interview type:    PSYCHOSOCIAL DATA Living Status:  FACILITY Admitted from facility:  [facility] Level of care:  Assisted Living Primary support name:  Zacary Bauer Primary support relationship to patient:  CHILD, ADULT Degree of support available:   Good support. Patient's son Ollin Hochmuth is patient's HCPOA and POA. All of his children, including daughters Arthor Captain and Maddoxx Burkitt are concerned and involved.    CURRENT CONCERNS Current Concerns  Post-Acute Placement   Other Concerns:    SOCIAL WORK ASSESSMENT / PLAN CSW talked by phone with daughter, Ms. Para March regarding discharge planning. Daughter informed that patient evaluated by inpatient rehab and determined not appropriate and SNF recommended. Ms. Para March indicated that she and her siblings are in agreement that due to patient's dementia, they do not want to change his environment and request that he return to [facility] at discharge. CSW offered support and advised daughter that she will be contacted when patient ready for discharge. Ms. Para March reported that she will communicate conversation to her siblings.   Assessment/plan status:  Psychosocial Support/Ongoing Assessment of Needs Other assessment/ plan:   Information/referral to community resources:   None needed or requested at this time.    PATIENT'S/FAMILY'S RESPONSE TO PLAN OF CARE: Daughter receptive to talking with CSW about discharge planning. She was pleasantly firm about the family's request for patient to return to his ALF at discharge. CSW offered support and  informed daughter that she will be contacted when patient ready for discharge.

## 2014-04-25 NOTE — Consult Note (Signed)
Physical Medicine and Rehabilitation Consult Reason for Consult: Respiratory failure Referring Physician: [facility]   HPI: Howard Payne is a 80 y.o. right-handed male with history of atrial fibrillation on chronic Coumadin, hypertension, mild dementia. Patient resides at a local assisted-living [facility]). Patient used a walker prior to admission. Presented 04/23/2014 with increasing shortness of breath. Noted recent fall 2 days prior was seen in the ED discharge back to assisted living facility. Recent cranial CT scan negative for acute changes. CT cervical spine negative. Chest x-ray 04/23/2014 findings represent chronic congestive heart failure superimposed on emphysematous change. Patient maintained on nebulizer treatments as well as prednisone taper. She remains on chronic Coumadin therapy as directed. Recent echocardiogram in April showed no LV dysfunction noted. Remains on Levaquin for empiric coverage. Ongoing bouts of confusion and restlessness noted. Physical therapy evaluation completed 04/24/2014 with recommendations for physical medicine rehabilitation consult.   Review of Systems  Respiratory: Positive for cough and shortness of breath.   Cardiovascular: Positive for palpitations and leg swelling.  Gastrointestinal:       GERD  Psychiatric/Behavioral: Positive for memory loss.  All other systems reviewed and are negative.  Past Medical History  Diagnosis Date  . Irregular heart rate   . Macular degeneration   . Cognitive impairment   . Hypotension   . 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 Glaucoma   . Atrial fibrillation   . Dysrhythmia   . GERD (gastroesophageal reflux disease)    Past Surgical History  Procedure Laterality Date  . Mohs surgery  2006, 07/06/12    Left Lower Eyelid: Invasive Squamous Cell Carcinoma  . Shave biopsy  05/14/12    Left  Lower Forehead  . Skin punch biopsy  05/14/12    Mid Left Lower Eyelid  . Eye surgery     Family History  Problem Relation Age of Onset  . Skin cancer Sister     Non-melanoma  . Stroke Mother   . Stroke Father   . Diabetes Father    Social History:  reports that he has never smoked. He has never used smokeless tobacco. He reports that he does not drink alcohol or use illicit drugs. Allergies: No Known Allergies Medications Prior to Admission  Medication Sig Dispense Refill  . acetaminophen (TYLENOL) 325 MG tablet Take 650 mg by mouth 3 (three) times daily.      Marland Kitchen albuterol (PROVENTIL HFA;VENTOLIN HFA) 108 (90 BASE) MCG/ACT inhaler Inhale 2 puffs into the lungs every 6 (six) hours as needed for wheezing or shortness of breath.  1 Inhaler  1  . albuterol (PROVENTIL) (2.5 MG/3ML) 0.083% nebulizer solution Take 2.5 mg by nebulization every 6 (six) hours as needed for wheezing or shortness of breath.      . diclofenac sodium (VOLTAREN) 1 % GEL Apply 2 g topically 2 (two) times daily. Apply to right knee      . digoxin (LANOXIN) 0.125 MG tablet Take 0.125 mg by mouth daily.      . diphenhydrAMINE (BENADRYL) 25 MG tablet Take 25 mg by mouth every 6 (six) hours as needed for itching.      . Eyelid Cleansers (OCUSOFT LID SCRUB EX) Place 1 application into the left eye daily.       Marland Kitchen guaiFENesin (MUCINEX) 600 MG 12 hr tablet Take 600 mg by mouth 2 (two) times  daily as needed for cough or to loosen phlegm.      Marland Kitchen guaiFENesin-codeine (ROBITUSSIN AC) 100-10 MG/5ML syrup Take 5 mLs by mouth every 6 (six) hours as needed for cough.       . latanoprost (XALATAN) 0.005 % ophthalmic solution Place 1 drop into both eyes at bedtime.      . Memantine HCl ER (NAMENDA XR) 28 MG CP24 Take 28 mg by mouth daily.      Marland Kitchen omeprazole (PRILOSEC) 20 MG capsule Take 1 capsule (20 mg total) by mouth daily.  90 capsule  3  . Polyethyl Glycol-Propyl Glycol (SYSTANE) 0.4-0.3 % GEL Place 1 application into the left eye at  bedtime. On to eyelid      . polyvinyl alcohol (LIQUIFILM TEARS) 1.4 % ophthalmic solution Place 1 drop into the left eye daily as needed for dry eyes.      Marland Kitchen sertraline (ZOLOFT) 25 MG tablet Take 1 tablet (25 mg total) by mouth daily.  30 tablet  11  . sulfacetamide (BLEPH-10) 10 % ophthalmic solution Place 2 drops into the left eye 4 (four) times daily.      Marland Kitchen tobramycin (TOBREX) 0.3 % ophthalmic solution Place 1 drop into both eyes every 4 (four) hours. Use drops for 7 days  5 mL  0  . warfarin (COUMADIN) 2.5 MG tablet Take 2.5 mg by mouth daily. Takes 2.5 mg by mouth every Sunday, Monday, Wednesday, Thursday, Friday.      . warfarin (COUMADIN) 5 MG tablet Take 5 mg by mouth See admin instructions. Take 5 mg by mouth every Tuesday and Saturday.      Dema Severin Petrolatum-Mineral Oil (PURALUBE) 85-15 % OINT Place 1 application into the left eye 3 (three) times daily.      Marland Kitchen trimethoprim-polymyxin b (POLYTRIM) ophthalmic solution Place 2 drops into the left eye 4 (four) times daily.        Home: Home Living Family/patient expects to be discharged to:: Assisted living Home Equipment: Other (comment) (difficult to ascertain from pt) Additional Comments: Need mroe reliable info re: pt's PLOF and level of assist available to him at Assisted living  Functional History: Prior Function Level of Independence: Needs assistance Comments: Inferred need for assist, but unable to ascertain PLOF from pt Functional Status:  Mobility: Bed Mobility Overal bed mobility: Needs Assistance Bed Mobility: Supine to Sit Supine to sit: Mod assist General bed mobility comments: Mod assist to pull to sit; pt able to clear LEs off side of bed with cues, but no need for LE assist Transfers Overall transfer level: Needs assistance Equipment used: 2 person hand held assist Transfers: Sit to/from Stand;Stand Pivot Transfers Sit to Stand: +2 physical assistance;Mod assist Stand pivot transfers: +2 physical  assistance;Mod assist General transfer comment: Cues for technique and bilateral steadying assist given for sit to stand; Good contro of stand to sit with cues for safety; Able to take small steps to chair on pt's R Ambulation/Gait General Gait Details: Did not walk a long enough distance for appreciable gait analysis    ADL:    Cognition: Cognition Overall Cognitive Status: No family/caregiver present to determine baseline cognitive functioning Orientation Level: Oriented to person;Disoriented to place;Disoriented to situation;Oriented to time Cognition Arousal/Alertness: Lethargic Behavior During Therapy: WFL for tasks assessed/performed Overall Cognitive Status: No family/caregiver present to determine baseline cognitive functioning  Blood pressure 123/78, pulse 119, temperature 98.2 F (36.8 C), temperature source Oral, resp. rate 22, height 6\' 3"  (1.905 m), weight 82 kg (  180 lb 12.4 oz), SpO2 97.00%. Physical Exam  HENT:  Head: Normocephalic.  Eyes:  Left eye is red and injected  Neck: Normal range of motion. Neck supple. No thyromegaly present.  Cardiovascular:  Cardiac rate controlled  Respiratory:  Decreased breath sounds at the bases but clear to auscultation  GI: Soft. Bowel sounds are normal. He exhibits no distension.  Neurological:  Patient is alert makes good eye contact with examiner. He does follow simple commands. Long-term memory is relatively intact. Noted deficits in short-term memory  Skin: Skin is warm and dry.    Results for orders placed during the hospital encounter of 04/23/14 (from the past 24 hour(s))  PROTIME-INR     Status: Abnormal   Collection Time    04/25/14  5:32 AM      Result Value Ref Range   Prothrombin Time 30.5 (*) 11.6 - 15.2 seconds   INR 3.06 (*) 0.00 - 1.49  CBC     Status: Abnormal   Collection Time    04/25/14  5:32 AM      Result Value Ref Range   WBC 18.8 (*) 4.0 - 10.5 K/uL   RBC 4.39  4.22 - 5.81 MIL/uL   Hemoglobin  14.2  13.0 - 17.0 g/dL   HCT 42.3  39.0 - 52.0 %   MCV 96.4  78.0 - 100.0 fL   MCH 32.3  26.0 - 34.0 pg   MCHC 33.6  30.0 - 36.0 g/dL   RDW 13.9  11.5 - 15.5 %   Platelets 287  150 - 400 K/uL  BASIC METABOLIC PANEL     Status: Abnormal   Collection Time    04/25/14  5:32 AM      Result Value Ref Range   Sodium 140  137 - 147 mEq/L   Potassium 5.1  3.7 - 5.3 mEq/L   Chloride 99  96 - 112 mEq/L   CO2 24  19 - 32 mEq/L   Glucose, Bld 127 (*) 70 - 99 mg/dL   BUN 33 (*) 6 - 23 mg/dL   Creatinine, Ser 0.98  0.50 - 1.35 mg/dL   Calcium 9.9  8.4 - 10.5 mg/dL   GFR calc non Af Amer 70 (*) >90 mL/min   GFR calc Af Amer 81 (*) >90 mL/min   Dg Chest 2 View  04/23/2014   CLINICAL DATA:  Wheezing and shortness of breath  EXAM: CHEST  2 VIEW  COMPARISON:  April 21, 2014  FINDINGS: There is underlying emphysematous change. There is persistent interstitial prominence, primarily in the lung bases but also present in the mid lung regions. There is no well-defined consolidation. There is cardiomegaly. The pulmonary vascularity suggests mild pulmonary venous hypertension. No adenopathy. There is atherosclerotic change in the aorta. No bone lesions are appreciated. There is degenerative change in the thoracic spine.  IMPRESSION: Findings felt to represent chronic congestive heart failure superimposed on emphysematous change. No consolidation.   Electronically Signed   By: Lowella Grip M.D.   On: 04/23/2014 14:07   Dg Chest Port 1 View  04/24/2014   CLINICAL DATA:  Shortness of breath.  EXAM: PORTABLE CHEST - 1 VIEW  COMPARISON:  Chest x-ray 04/23/2014.  FINDINGS: Compared with yesterday's examination, the degree of cephalization of the pulmonary vasculature, and indistinctness of interstitial markings appear slightly improved, likely to reflect some resolving interstitial pulmonary edema. No confluent consolidative airspace disease. No pleural effusions. Heart size remains mildly enlarged. The patient is  rotated to  the right on today's exam, resulting in distortion of the mediastinal contours and reduced diagnostic sensitivity and specificity for mediastinal pathology. Atherosclerosis in the thoracic aorta.  IMPRESSION: 1. Overall, there is improvement compared with yesterday's examination suggesting resolving congestive heart failure. 2. Atherosclerosis.   Electronically Signed   By: Vinnie Langton M.D.   On: 04/24/2014 13:41    Assessment/Plan: Diagnosis: deconditioned 80yo male with dementia 1. Does the need for close, 24 hr/day medical supervision in concert with the patient's rehab needs make it unreasonable for this patient to be served in a less intensive setting? No 2. Co-Morbidities requiring supervision/potential complications: see above 3. Due to bladder management, bowel management, safety and skin/wound care, does the patient require 24 hr/day rehab nursing? No 4. Does the patient require coordinated care of a physician, rehab nurse, PT, OT to address physical and functional deficits in the context of the above medical diagnosis(es)? No Addressing deficits in the following areas: balance, endurance, locomotion, transferring and feeding 5. Can the patient actively participate in an intensive therapy program of at least 3 hrs of therapy per day at least 5 days per week? No 6. The potential for patient to make measurable gains while on inpatient rehab is poor 7. Anticipated functional outcomes upon discharge from inpatient rehab are n/a  with PT, n/a with OT, n/a with SLP. 8. Estimated rehab length of stay to reach the above functional goals is: n/a 9. Does the patient have adequate social supports to accommodate these discharge functional goals? No 10. Anticipated D/C setting: Other 11. Anticipated post D/C treatments: N/A 12. Overall Rehab/Functional Prognosis: fair  RECOMMENDATIONS: This patient's condition is appropriate for continued rehabilitative care in the following setting:  SNF Patient has agreed to participate in recommended program. N/A Note that insurance prior authorization may be required for reimbursement for recommended care.  Comment: Pt's ALF has other, lower tiers of care available. Recommend placement in SNF level care when medically ready for D/C  Meredith Staggers, MD, [facility]     04/25/2014

## 2014-04-25 NOTE — Progress Notes (Signed)
ANTICOAGULATION CONSULT NOTE - Follow-Up  Consult  Pharmacy Consult for warfarin Indication: atrial fibrillation  No Known Allergies  Patient Measurements: Height: 6\' 3"  (190.5 cm) Weight: 180 lb 12.4 oz (82 kg) IBW/kg (Calculated) : 84.5   Vital Signs: Temp: 98.2 F (36.8 C) (06/08 1015) Temp src: Oral (06/08 1015) BP: 123/78 mmHg (06/08 1015) Pulse Rate: 119 (06/08 1015)  Labs:  Recent Labs  04/23/14 1250 04/24/14 0320 04/25/14 0532  HGB 14.5 13.9 14.2  HCT 43.9 41.7 42.3  PLT 245 258 287  LABPROT 20.9* 21.7* 30.5*  INR 1.86* 1.96* 3.06*  CREATININE 0.81 0.80 0.98  TROPONINI <0.30  --   --     Estimated Creatinine Clearance: 56.9 ml/min (by C-G formula based on Cr of 0.98).   Medical History: Past Medical History  Diagnosis Date  . Irregular heart rate   . Macular degeneration   . Cognitive impairment   . Hypotension   . 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 Glaucoma   . Atrial fibrillation   . Dysrhythmia   . GERD (gastroesophageal reflux disease)     Assessment: 27 YOM who continues on warfarin from PTA for hx Afib with a slightly SUPRAtherapeutic INR today (INR 3.06 << 1.96). Given the patient's large jump in INR - will plan to hold warfarin today. CBC wnl - no bleeding noted. The patient is noted to be on Levaquin and IV steroids - both of which can increase warfarin sensitivity.  PTA dose was warfarin 2.5mg  daily EXCEPT 5mg  on Tues/Sat.  Goal of Therapy:  INR 2-3   Plan:  1. Hold warfarin today 2. Will continue to monitor for any signs/symptoms of bleeding and will follow up with PT/INR in the a.m.   Alycia Rossetti, PharmD, BCPS Clinical Pharmacist Pager: [phone] 04/25/2014 11:12 AM

## 2014-04-26 ENCOUNTER — Telehealth: Payer: Self-pay | Admitting: Internal Medicine

## 2014-04-26 LAB — CBC
HEMATOCRIT: 41.6 % (ref 39.0–52.0)
Hemoglobin: 13.7 g/dL (ref 13.0–17.0)
MCH: 31.9 pg (ref 26.0–34.0)
MCHC: 32.9 g/dL (ref 30.0–36.0)
MCV: 97 fL (ref 78.0–100.0)
Platelets: 262 10*3/uL (ref 150–400)
RBC: 4.29 MIL/uL (ref 4.22–5.81)
RDW: 13.9 % (ref 11.5–15.5)
WBC: 14.9 10*3/uL — AB (ref 4.0–10.5)

## 2014-04-26 LAB — BASIC METABOLIC PANEL
BUN: 43 mg/dL — AB (ref 6–23)
CHLORIDE: 99 meq/L (ref 96–112)
CO2: 25 mEq/L (ref 19–32)
Calcium: 8.8 mg/dL (ref 8.4–10.5)
Creatinine, Ser: 1.04 mg/dL (ref 0.50–1.35)
GFR calc Af Amer: 70 mL/min — ABNORMAL LOW (ref >90)
GFR calc non Af Amer: 61 mL/min — ABNORMAL LOW (ref >90)
GLUCOSE: 101 mg/dL — AB (ref 70–99)
POTASSIUM: 4 meq/L (ref 3.7–5.3)
Sodium: 139 mEq/L (ref 137–147)

## 2014-04-26 LAB — PROTIME-INR
INR: 4.33 — ABNORMAL HIGH (ref 0.00–1.49)
Prothrombin Time: 39.8 seconds — ABNORMAL HIGH (ref 11.6–15.2)

## 2014-04-26 MED ORDER — LEVOFLOXACIN 750 MG PO TABS: 750.0000 mg | ORAL_TABLET | Freq: Every day | ORAL | Status: DC

## 2014-04-26 MED ORDER — PREDNISONE 20 MG PO TABS: ORAL_TABLET | ORAL | Status: DC

## 2014-04-26 NOTE — Telephone Encounter (Signed)
Is it ok for physical therapy to check pt's coum by finger stick and call in results? If so, will need an order to dc nursing . She will take a verbal if that is ok

## 2014-04-26 NOTE — Progress Notes (Signed)
CARE MANAGEMENT NOTE 04/26/2014  Patient:  Howard Payne,Howard Payne   Account Number:  [id number]  Date Initiated:  04/26/2014  Documentation initiated by:  Lizabeth Leyden  Subjective/Objective Assessment:   admitted from ALF with increased SOB     Action/Plan:   progression of care and discharge planning, return to ALF   Anticipated DC Date:  04/26/2014   Anticipated DC Plan:  ASSISTED LIVING / REST HOME  In-house referral  Clinical Social Worker      [place]  CM consult      Choice offered to / List presented to:             [place]   Status of service:   Medicare Important Message given?   (If response is "NO", the following Medicare IM given date fields will be blank) Date Medicare IM given:   Date Additional Medicare IM given:  04/26/2014  Discharge Disposition:    Per UR Regulation:    If discussed at Long Length of Stay Meetings, dates discussed:    Comments:  04/26/2014 1100 Howard Borquez rn, ccm case manager [phone] met with patient's daughter reviewed IM, copy to chart   04/26/2014  [place], [place] [phone] CM referral for [facility], [place], OT  [facility]   [phone] Howard Payne Nurse coordinator discussed discharge orders for [name], [name], [name] and ned for INR lab draws they use [place] [facility]. NCM will contact Howard Payne with tthe referral

## 2014-04-26 NOTE — Discharge Instructions (Signed)
Acute Bronchitis Bronchitis is inflammation of the airways that extend from the windpipe into the lungs (bronchi). The inflammation often causes mucus to develop. This leads to a cough, which is the most common symptom of bronchitis.  In acute bronchitis, the condition usually develops suddenly and goes away over time, usually in a couple weeks. Smoking, allergies, and asthma can make bronchitis worse. Repeated episodes of bronchitis may cause further lung problems.  CAUSES Acute bronchitis is most often caused by the same virus that causes a cold. The virus can spread from person to person (contagious).  SIGNS AND SYMPTOMS   Cough.   Fever.   Coughing up mucus.   Body aches.   Chest congestion.   Chills.   Shortness of breath.   Sore throat.  DIAGNOSIS  Acute bronchitis is usually diagnosed through a physical exam. Tests, such as chest X-rays, are sometimes done to rule out other conditions.  TREATMENT  Acute bronchitis usually goes away in a couple weeks. Often times, no medical treatment is necessary. Medicines are sometimes given for relief of fever or cough. Antibiotics are usually not needed but may be prescribed in certain situations. In some cases, an inhaler may be recommended to help reduce shortness of breath and control the cough. A cool mist vaporizer may also be used to help thin bronchial secretions and make it easier to clear the chest.  HOME CARE INSTRUCTIONS  Get plenty of rest.   Drink enough fluids to keep your urine clear or pale yellow (unless you have a medical condition that requires fluid restriction). Increasing fluids may help thin your secretions and will prevent dehydration.   Only take over-the-counter or prescription medicines as directed by your health care provider.   Avoid smoking and secondhand smoke. Exposure to cigarette smoke or irritating chemicals will make bronchitis worse. If you are a smoker, consider using nicotine gum or skin  patches to help control withdrawal symptoms. Quitting smoking will help your lungs heal faster.   Reduce the chances of another bout of acute bronchitis by washing your hands frequently, avoiding people with cold symptoms, and trying not to touch your hands to your mouth, nose, or eyes.   Follow up with your health care provider as directed.  SEEK MEDICAL CARE IF: Your symptoms do not improve after 1 week of treatment.  SEEK IMMEDIATE MEDICAL CARE IF:  You develop an increased fever or chills.   You have chest pain.   You have severe shortness of breath.  You have bloody sputum.   You develop dehydration.  You develop fainting.  You develop repeated vomiting.  You develop a severe headache. MAKE SURE YOU:   Understand these instructions.  Will watch your condition.  Will get help right away if you are not doing well or get worse. Document Released: 12/12/2004 Document Revised: 07/07/2013 Document Reviewed: 04/27/2013 Exit[employer] Patient Information 2014 [employer].  

## 2014-04-26 NOTE — Progress Notes (Signed)
ANTICOAGULATION CONSULT NOTE - Follow-Up  Consult  Pharmacy Consult for warfarin Indication: atrial fibrillation  No Known Allergies  Patient Measurements: Height: 6\' 3"  (190.5 cm) Weight: 180 lb 12.4 oz (82 kg) IBW/kg (Calculated) : 84.5   Vital Signs: Temp: 97 F (36.1 C) (06/09 1102) Temp src: Oral (06/09 1102) BP: 153/97 mmHg (06/09 1102) Pulse Rate: 85 (06/09 1102)  Labs:  Recent Labs  04/23/14 1250 04/24/14 0320 04/25/14 0532 04/26/14 0507  HGB 14.5 13.9 14.2 13.7  HCT 43.9 41.7 42.3 41.6  PLT 245 258 287 262  LABPROT 20.9* 21.7* 30.5* 39.8*  INR 1.86* 1.96* 3.06* 4.33*  CREATININE 0.81 0.80 0.98 1.04  TROPONINI <0.30  --   --   --     Estimated Creatinine Clearance: 53.7 ml/min (by C-G formula based on Cr of 1.04).   Medical History: Past Medical History  Diagnosis Date  . Irregular heart rate   . Macular degeneration   . Cognitive impairment   . Hypotension   . 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 Glaucoma   . Atrial fibrillation   . Dysrhythmia   . GERD (gastroesophageal reflux disease)     Assessment: 59 YOM who continues on warfarin from [facility] for hx Afib with a  SUPRAtherapeutic INR today despite holding warfarin yesterday evening (INR 4.33 << 3.06, goal of 2-3). CBC wnl - no bleeding noted. The patient is noted to be on Levaquin and po steroids - both of which can increase warfarin sensitivity.  Discussed with RN being careful when moving/assisting patient today d/t frequent falls and INR being elevated - she is aware.   PTA dose was warfarin 2.5mg  daily EXCEPT 5mg  on Tues/Sat.  Goal of Therapy:  INR 2-3   Plan:  1. Hold warfarin today 2. Will continue to monitor for any signs/symptoms of bleeding and will follow up with PT/INR in the a.m.   Alycia Rossetti, PharmD, BCPS Clinical Pharmacist Pager: [phone] 04/26/2014 11:15 AM

## 2014-04-26 NOTE — Discharge Summary (Addendum)
[facility]  Physician Discharge Summary   Patient ID: Howard Payne MRN: [id number] DOB/AGE: [id number] 78 y.o.  Admit date: 04/23/2014 Discharge date: 04/26/2014  PCP: Nyoka Cowden, MD  DISCHARGE DIAGNOSES:  Principal Problem:   Acute respiratory failure Active Problems:   Chronic atrial fibrillation   Chronic anticoagulation   Acute bronchitis   RECOMMENDATIONS FOR OUTPATIENT FOLLOW UP: 1. PT/INR on 6/10 and 6/11 with results to PCP 2. Hold warfarin till resumed by PCP depending on INR values.  3. PCP to decide if continuing warfarin is reasonable in this patient with history of falls. 4. Home Health RN, PT and OT to be arranged.  DISCHARGE CONDITION: fair  Diet recommendation: Heart Healthy  [facility] Weights   04/23/14 2034 04/24/14 2103  Weight: 82.6 kg (182 lb 1.6 oz) 82 kg (180 lb 12.4 oz)    INITIAL HISTORY: Howard Payne is a 80 y.o. male with a past medical history of skin cancer and atrial fibrillation on Coumadin, hypertension, mild dementia who presented with shortness of breath that started about 1 day prior to admission. He was also brought in earlier this week for falls. He was thought to have bronchitis and was admitted to the hospital.   Consultations:  None  Procedures: None  HOSPITAL COURSE:   Acute respiratory failure/ Acute bronchitis  This has improved. Appears to be predominantly reactive airway disease with some degree of volume overload. Repeat CXR shows improvement. Taper steroids. PRN Nebs. Antibiotics for 3 more days.   Mild Hyponatremia  Resolved.   Chronic atrial fibrillation on Chronic anticoagulation  Continue current home meds including Digoxin. Rate goes up whenever he gets agitated. INR is high likely due to drug interaction. No bleeding. Continue to hold warfarin for now. Repeat INR at ALF and decide on resuming warfarin. Would recommend PCP to look into anticoagulation considering his recent falls. INR is 4.3  today.  Frequent Falls this week  PT/OT recommend CIR. Rehab MD did not feel patient was a good CIR candidate. SNF was recommended. But daughter and Son want him to return to ALF. CSW to find out if that is possible.   Dementia  We'll continue his current home meds. He was agitated here due to new surroundings. Received haldol overnight. Is sleepy this AM but responsive. Might do better in familiar surroundings.  Code Status: DNR   Overall he is stable. SNF is recommended but family declines. CSW to find out if ALF will take him back. [place] for discharge later today.   PERTINENT LABS:  The results of significant diagnostics from this hospitalization (including imaging, microbiology, ancillary and laboratory) are listed below for reference.     Labs: Basic Metabolic Panel:  Recent Labs Lab 04/21/14 2012 04/23/14 1250 04/24/14 0320 04/25/14 0532 04/26/14 0507  NA 140 136* 140 140 139  K 4.1 4.2 4.1 5.1 4.0  CL 98 98 100 99 99  CO2  --  20 25 24 25   GLUCOSE 162* 129* 151* 127* 101*  BUN 18 16 19  33* 43*  CREATININE 1.00 0.81 0.80 0.98 1.04  CALCIUM  --  9.3 9.3 9.9 8.8   Liver Function Tests:  Recent Labs Lab 04/23/14 1250 04/24/14 0320  AST 36 27  ALT 25 26  ALKPHOS 104 106  BILITOT 2.1* 1.7*  PROT 8.2 7.6  ALBUMIN 3.6 3.1*   CBC:  Recent Labs Lab 04/21/14 2004 04/21/14 2012 04/23/14 1250 04/24/14 0320 04/25/14 0532 04/26/14 0507  WBC 10.6*  --  12.9* 11.0* 18.8*  14.9*  NEUTROABS 8.4*  --   --   --   --   --   HGB 13.7 15.3 14.5 13.9 14.2 13.7  HCT 41.1 45.0 43.9 41.7 42.3 41.6  MCV 96.9  --  97.3 96.3 96.4 97.0  PLT 232  --  245 258 287 262   Cardiac Enzymes:  Recent Labs Lab 04/23/14 1250  TROPONINI <0.30   BNP: BNP (last 3 results)  Recent Labs  04/23/14 1250  PROBNP 841.6*    IMAGING STUDIES Dg Chest 2 View  04/23/2014   CLINICAL DATA:  Wheezing and shortness of breath  EXAM: CHEST  2 VIEW  COMPARISON:  April 21, 2014  FINDINGS: There  is underlying emphysematous change. There is persistent interstitial prominence, primarily in the lung bases but also present in the mid lung regions. There is no well-defined consolidation. There is cardiomegaly. The pulmonary vascularity suggests mild pulmonary venous hypertension. No adenopathy. There is atherosclerotic change in the aorta. No bone lesions are appreciated. There is degenerative change in the thoracic spine.  IMPRESSION: Findings felt to represent chronic congestive heart failure superimposed on emphysematous change. No consolidation.   Electronically Signed   By: Lowella Grip M.D.   On: 04/23/2014 14:07   Dg Chest 2 View  04/21/2014   CLINICAL DATA:  Cough  EXAM: CHEST  2 VIEW  COMPARISON:  03/04/2014  FINDINGS: Cardiomegaly noted. Coarsened interstitial markings are identified bilaterally without focal opacity. Minimal crowding at the lung bases is identified. No pleural effusion. No new parenchymal opacification. Mild kyphosis without compression deformity.  IMPRESSION: Chronically prominent interstitial markings without focal acute finding. Minimal crowding at the lung bases.   Electronically Signed   By: Conchita Paris M.D.   On: 04/21/2014 19:56   Ct Head Wo Contrast  04/21/2014   CLINICAL DATA:  80 year old male with headache and neck pain following fall.  EXAM: CT HEAD WITHOUT CONTRAST  CT CERVICAL SPINE WITHOUT CONTRAST  TECHNIQUE: Multidetector CT imaging of the head and cervical spine was performed following the standard protocol without intravenous contrast. Multiplanar CT image reconstructions of the cervical spine were also generated.  COMPARISON:  10/31/2012 CT  FINDINGS: CT HEAD FINDINGS  Atrophy and chronic small-vessel white matter ischemic changes again noted.  No acute intracranial abnormalities are identified, including mass lesion or mass effect, hydrocephalus, extra-axial fluid collection, midline shift, hemorrhage, or acute infarction. The visualized bony  calvarium is unremarkable.  CT CERVICAL SPINE FINDINGS  3 mm anterolisthesis of C5 on C6 and 1 mm anterolisthesis of C4 on C5 again noted.  There is no evidence of acute fracture or new subluxation.  No prevertebral soft tissue swelling is present.  Degenerative disc disease and spondylosis from C5-T1 again identified.  Moderate facet arthropathy throughout the cervical spine is unchanged.  No focal bony lesions are present.  IMPRESSION: No evidence of acute abnormality.  Atrophy and chronic small-vessel white matter ischemic changes.  Multilevel degenerative changes within the cervical spine again identified.   Electronically Signed   By: Hassan Rowan M.D.   On: 04/21/2014 20:06   Ct Cervical Spine Wo Contrast  04/21/2014   CLINICAL DATA:  80 year old male with headache and neck pain following fall.  EXAM: CT HEAD WITHOUT CONTRAST  CT CERVICAL SPINE WITHOUT CONTRAST  TECHNIQUE: Multidetector CT imaging of the head and cervical spine was performed following the standard protocol without intravenous contrast. Multiplanar CT image reconstructions of the cervical spine were also generated.  COMPARISON:  10/31/2012 CT  FINDINGS: CT HEAD FINDINGS  Atrophy and chronic small-vessel white matter ischemic changes again noted.  No acute intracranial abnormalities are identified, including mass lesion or mass effect, hydrocephalus, extra-axial fluid collection, midline shift, hemorrhage, or acute infarction. The visualized bony calvarium is unremarkable.  CT CERVICAL SPINE FINDINGS  3 mm anterolisthesis of C5 on C6 and 1 mm anterolisthesis of C4 on C5 again noted.  There is no evidence of acute fracture or new subluxation.  No prevertebral soft tissue swelling is present.  Degenerative disc disease and spondylosis from C5-T1 again identified.  Moderate facet arthropathy throughout the cervical spine is unchanged.  No focal bony lesions are present.  IMPRESSION: No evidence of acute abnormality.  Atrophy and chronic small-vessel  white matter ischemic changes.  Multilevel degenerative changes within the cervical spine again identified.   Electronically Signed   By: Hassan Rowan M.D.   On: 04/21/2014 20:06   Dg Chest Port 1 View  04/24/2014   CLINICAL DATA:  Shortness of breath.  EXAM: PORTABLE CHEST - 1 VIEW  COMPARISON:  Chest x-ray 04/23/2014.  FINDINGS: Compared with yesterday's examination, the degree of cephalization of the pulmonary vasculature, and indistinctness of interstitial markings appear slightly improved, likely to reflect some resolving interstitial pulmonary edema. No confluent consolidative airspace disease. No pleural effusions. Heart size remains mildly enlarged. The patient is rotated to the right on today's exam, resulting in distortion of the mediastinal contours and reduced diagnostic sensitivity and specificity for mediastinal pathology. Atherosclerosis in the thoracic aorta.  IMPRESSION: 1. Overall, there is improvement compared with yesterday's examination suggesting resolving congestive heart failure. 2. Atherosclerosis.   Electronically Signed   By: Vinnie Langton M.D.   On: 04/24/2014 13:41    DISCHARGE EXAMINATION: Filed Vitals:   04/25/14 1738 04/25/14 2143 04/26/14 0513 04/26/14 1102  BP: 127/91 102/66 140/98 153/97  Pulse: 123 123 113 85  Temp: 98.5 F (36.9 C) 98.7 F (37.1 C) 97.6 F (36.4 C) 97 F (36.1 C)  TempSrc: Oral   Oral  Resp: 16 17 18 18   Height:      Weight:      SpO2: 99% 97% 99% 98%   General appearance: somnolent but arousable. Resp: clear to auscultation bilaterally Cardio: irregularly irregular.  GI: soft, non-tender; bowel sounds normal; no masses,  no organomegaly Extremities: no edema Neurologic: Somnolent but arousable. Follows commands. Confused as in baseline.  DISPOSITION: ALF vs SNF  Discharge Instructions   Diet - low sodium heart healthy    Complete by:  As directed      Discharge instructions    Complete by:  As directed   Please note that we are  holding warfarin for elevated INR. INR to be checked on 6/10 and 6/11 with results to be called to PCP for futher instructions.     Increase activity slowly    Complete by:  As directed            ALLERGIES: No Known Allergies  Current Discharge Medication List    START taking these medications   Details  levofloxacin (LEVAQUIN) 750 MG tablet Take 1 tablet (750 mg total) by mouth daily. For 3 more days    predniSONE (DELTASONE) 20 MG tablet Take 3 tablets once daily for 3 days, then take 2 tablets once daily for 3 days, then take 1 tablet once daily for 3 days, THEN STOP      CONTINUE these medications which have NOT CHANGED   Details  acetaminophen (TYLENOL) 325  MG tablet Take 650 mg by mouth 3 (three) times daily.    albuterol (PROVENTIL HFA;VENTOLIN HFA) 108 (90 BASE) MCG/ACT inhaler Inhale 2 puffs into the lungs every 6 (six) hours as needed for wheezing or shortness of breath. Qty: 1 Inhaler, Refills: 1    albuterol (PROVENTIL) (2.5 MG/3ML) 0.083% nebulizer solution Take 2.5 mg by nebulization every 6 (six) hours as needed for wheezing or shortness of breath.    diclofenac sodium (VOLTAREN) 1 % GEL Apply 2 g topically 2 (two) times daily. Apply to right knee    digoxin (LANOXIN) 0.125 MG tablet Take 0.125 mg by mouth daily.    diphenhydrAMINE (BENADRYL) 25 MG tablet Take 25 mg by mouth every 6 (six) hours as needed for itching.    Eyelid Cleansers (OCUSOFT LID SCRUB EX) Place 1 application into the left eye daily.     guaiFENesin (MUCINEX) 600 MG 12 hr tablet Take 600 mg by mouth 2 (two) times daily as needed for cough or to loosen phlegm.    guaiFENesin-codeine (ROBITUSSIN AC) 100-10 MG/5ML syrup Take 5 mLs by mouth every 6 (six) hours as needed for cough.     latanoprost (XALATAN) 0.005 % ophthalmic solution Place 1 drop into both eyes at bedtime.    Memantine HCl ER (NAMENDA XR) 28 MG CP24 Take 28 mg by mouth daily.    omeprazole (PRILOSEC) 20 MG capsule Take 1  capsule (20 mg total) by mouth daily. Qty: 90 capsule, Refills: 3    Polyethyl Glycol-Propyl Glycol (SYSTANE) 0.4-0.3 % GEL Place 1 application into the left eye at bedtime. On to eyelid    polyvinyl alcohol (LIQUIFILM TEARS) 1.4 % ophthalmic solution Place 1 drop into the left eye daily as needed for dry eyes.    sertraline (ZOLOFT) 25 MG tablet Take 1 tablet (25 mg total) by mouth daily. Qty: 30 tablet, Refills: 11    sulfacetamide (BLEPH-10) 10 % ophthalmic solution Place 2 drops into the left eye 4 (four) times daily.    tobramycin (TOBREX) 0.3 % ophthalmic solution Place 1 drop into both eyes every 4 (four) hours. Use drops for 7 days Qty: 5 mL, Refills: 0    White Petrolatum-Mineral Oil (PURALUBE) 85-15 % OINT Place 1 application into the left eye 3 (three) times daily.    trimethoprim-polymyxin b (POLYTRIM) ophthalmic solution Place 2 drops into the left eye 4 (four) times daily.      STOP taking these medications     warfarin (COUMADIN) 2.5 MG tablet      warfarin (COUMADIN) 5 MG tablet        Follow-up Information   Follow up with Nyoka Cowden, MD. Schedule an appointment as soon as possible for a visit in 1 week. (call results of PT/INR on 6/10 and 6/11 to Dr. Raliegh Ip for instructions regarding resuming warfarin)    Specialty:  Internal Medicine   Contact information:   [place] [id number] [phone]       TOTAL DISCHARGE TIME: 55 mins  Bonnielee Haff  [facility] Pager [phone]  04/26/2014, 11:34 AM  Disclaimer: This note was dictated with voice recognition software. Similar sounding words can inadvertently be transcribed and may not be corrected upon review.

## 2014-04-26 NOTE — Clinical Social Work Note (Addendum)
Patient medically stable for discharge back to [facility] facility today. CSW made contact with Director of Nursing, Dana Allan at facility and clinicals transmitted and reviewed by her. CSW also spoke with patient's daughter Arthor Captain today in person and by phone [phone]) regarding patient's discharge back to ALF and ambulance transport. Daughter called at 6 pm and message left that [facility] had arrived to transport patient to facility.  Brinsley Wence Givens, MSW, LCSW [phone]

## 2014-04-28 ENCOUNTER — Telehealth: Payer: Self-pay | Admitting: *Deleted

## 2014-04-28 NOTE — Telephone Encounter (Signed)
Spoke to Howard Payne with [facility] told her okay for Physical Therapy to check pt's coumadin by finger stick and call in results and discontinue nursing per Dr. Lincoln Brigham verbalized understanding.

## 2014-04-28 NOTE — Telephone Encounter (Signed)
Called [facility] spoke to [facility], told her received fax regarding pt's INR level told her to hold coumadin and repeat PT/INR on Monday per Dr.K and I will fax order over. Alethea verbalized understanding and stated put attention Colletta [place] on Fax. Told her okay. Order faxed.

## 2014-05-02 ENCOUNTER — Ambulatory Visit (INDEPENDENT_AMBULATORY_CARE_PROVIDER_SITE_OTHER): Payer: Medicare Other | Admitting: General Practice

## 2014-05-02 ENCOUNTER — Telehealth: Payer: Self-pay | Admitting: Internal Medicine

## 2014-05-02 DIAGNOSIS — I4891 Unspecified atrial fibrillation: Secondary | ICD-10-CM

## 2014-05-02 DIAGNOSIS — Z7901 Long term (current) use of anticoagulants: Secondary | ICD-10-CM

## 2014-05-02 DIAGNOSIS — Z5181 Encounter for therapeutic drug level monitoring: Secondary | ICD-10-CM

## 2014-05-02 LAB — POCT INR: INR: 1.6

## 2014-05-02 NOTE — Telephone Encounter (Signed)
Howard Payne called from [facility] and his INR is 1.6

## 2014-05-02 NOTE — Progress Notes (Signed)
Pre visit review using our clinic review tool, if applicable. No additional management support is needed unless otherwise documented below in the visit note. 

## 2014-05-03 NOTE — Telephone Encounter (Signed)
Please transfer to [facility]

## 2014-05-03 NOTE — Telephone Encounter (Signed)
Please advise 

## 2014-05-10 ENCOUNTER — Telehealth: Payer: Self-pay | Admitting: Internal Medicine

## 2014-05-10 ENCOUNTER — Ambulatory Visit (INDEPENDENT_AMBULATORY_CARE_PROVIDER_SITE_OTHER): Payer: Medicare Other | Admitting: Family Medicine

## 2014-05-10 DIAGNOSIS — Z7901 Long term (current) use of anticoagulants: Secondary | ICD-10-CM

## 2014-05-10 DIAGNOSIS — I4891 Unspecified atrial fibrillation: Secondary | ICD-10-CM

## 2014-05-10 DIAGNOSIS — Z5181 Encounter for therapeutic drug level monitoring: Secondary | ICD-10-CM

## 2014-05-10 LAB — POCT INR: INR: 2.1

## 2014-05-10 NOTE — Telephone Encounter (Signed)
i & r results 2.1  Took 2.5 mg yesterday     Pt takes 2.5 everyday except tues/sat he take 5mg 

## 2014-05-10 NOTE — Telephone Encounter (Signed)
See anticoag note

## 2014-05-12 DIAGNOSIS — Z5181 Encounter for therapeutic drug level monitoring: Secondary | ICD-10-CM

## 2014-05-12 DIAGNOSIS — I4891 Unspecified atrial fibrillation: Secondary | ICD-10-CM

## 2014-05-12 DIAGNOSIS — F0390 Unspecified dementia without behavioral disturbance: Secondary | ICD-10-CM

## 2014-05-12 DIAGNOSIS — R269 Unspecified abnormalities of gait and mobility: Secondary | ICD-10-CM

## 2014-05-12 DIAGNOSIS — J209 Acute bronchitis, unspecified: Secondary | ICD-10-CM

## 2014-05-12 DIAGNOSIS — Z9181 History of falling: Secondary | ICD-10-CM

## 2014-05-16 ENCOUNTER — Encounter: Payer: Self-pay | Admitting: Internal Medicine

## 2014-05-17 ENCOUNTER — Telehealth: Payer: Self-pay | Admitting: Internal Medicine

## 2014-05-17 NOTE — Telephone Encounter (Signed)
[facility] is needing clarification on wounded care order, nurse request hydrocolloid (verbal) leave msg.

## 2014-05-17 NOTE — Telephone Encounter (Signed)
Dr. Raliegh Ip, see message and advise if type of wound care is okay?

## 2014-05-17 NOTE — Telephone Encounter (Signed)
Hydrocolloid dressing OK

## 2014-05-18 NOTE — Telephone Encounter (Signed)
Left detailed message on personal voicemail for Tammy regarding pt, left verbal order for Hydrocolloid dressing OK per Dr. Burnice Logan.

## 2014-05-27 ENCOUNTER — Telehealth: Payer: Self-pay | Admitting: Internal Medicine

## 2014-05-27 NOTE — Telephone Encounter (Signed)
Howard Payne is calling to report pt has edema in both lower extremity 2 plus edema. Left calf 41.5 cm and right calf 37.6 cm.

## 2014-05-27 NOTE — Telephone Encounter (Signed)
Discussed with Dr. Sarajane Jews, verbal order given for Lasix 20 mg one tablet daily and follow up on Monday with Dr. Raliegh Ip.

## 2014-05-27 NOTE — Telephone Encounter (Signed)
Called [place] with [facility], told him will start pt on Lasix and follow up on Monday. Louie Casa said need to fax order over to [facility]. Told him okay.  Called [facility] and spoke to [place] and told her faxing order over for pt to start Lasix 20 mg one tablet daily and follow up on Monday with Dr. Raliegh Ip, appt given for 2:30 PM. Laqueta verbalized understanding. Order faxed to [facility] [phone].

## 2014-05-27 NOTE — Telephone Encounter (Signed)
They cannot fax the rx for furosemide 20 mg 1 tab by mo daily w/ no refills bc its too dark. Would like you to fax directly to [facility] care  [phone]

## 2014-05-28 MED ORDER — FUROSEMIDE 20 MG PO TABS: 20.0000 mg | ORAL_TABLET | Freq: Every day | ORAL | Status: DC

## 2014-05-28 NOTE — Telephone Encounter (Signed)
Daughter, Howard [facility], rx of furosemide 20mg  1 tab by mouth daily w/no refills is too dark and cannot be filled. Pt's daughter wants to know can she come pick up a written script for her father due to his swollen legs, pcp wants pt to follow up on Monday.  Please advise and call daughter back at [phone] Howard Payne) if this can be done. Thanks

## 2014-05-28 NOTE — Telephone Encounter (Signed)
Called [facility] to verify msg. [facility] stated that they couldn't read the rx for furosemide that was sent by Dr. Raliegh Ip. Father is needing med today wanting to know can we resend to his local pharmacy. Pt has appt on Monday to see md for f/u. OK per Dr. Damita Dunnings ok to [facility].../lmb

## 2014-05-30 ENCOUNTER — Ambulatory Visit (INDEPENDENT_AMBULATORY_CARE_PROVIDER_SITE_OTHER): Payer: Medicare Other | Admitting: Internal Medicine

## 2014-05-30 ENCOUNTER — Encounter: Payer: Self-pay | Admitting: Internal Medicine

## 2014-05-30 VITALS — BP 80/50 | HR 122 | Temp 98.0°F | Resp 20 | Ht 72.0 in | Wt 184.0 lb

## 2014-05-30 DIAGNOSIS — I509 Heart failure, unspecified: Secondary | ICD-10-CM

## 2014-05-30 DIAGNOSIS — I482 Chronic atrial fibrillation, unspecified: Secondary | ICD-10-CM

## 2014-05-30 DIAGNOSIS — I5022 Chronic systolic (congestive) heart failure: Secondary | ICD-10-CM

## 2014-05-30 DIAGNOSIS — Z7901 Long term (current) use of anticoagulants: Secondary | ICD-10-CM

## 2014-05-30 DIAGNOSIS — I4891 Unspecified atrial fibrillation: Secondary | ICD-10-CM

## 2014-05-30 DIAGNOSIS — F09 Unspecified mental disorder due to known physiological condition: Secondary | ICD-10-CM

## 2014-05-30 DIAGNOSIS — R4189 Other symptoms and signs involving cognitive functions and awareness: Secondary | ICD-10-CM

## 2014-05-30 LAB — CBC WITH DIFFERENTIAL/PLATELET
BASOS ABS: 0 10*3/uL (ref 0.0–0.1)
Basophils Relative: 0.1 % (ref 0.0–3.0)
EOS ABS: 0.2 10*3/uL (ref 0.0–0.7)
Eosinophils Relative: 2 % (ref 0.0–5.0)
HEMATOCRIT: 41.5 % (ref 39.0–52.0)
HEMOGLOBIN: 13.8 g/dL (ref 13.0–17.0)
LYMPHS ABS: 1.5 10*3/uL (ref 0.7–4.0)
Lymphocytes Relative: 18.9 % (ref 12.0–46.0)
MCHC: 33.3 g/dL (ref 30.0–36.0)
MCV: 93.8 fl (ref 78.0–100.0)
MONO ABS: 1.1 10*3/uL — AB (ref 0.1–1.0)
MONOS PCT: 13.8 % — AB (ref 3.0–12.0)
NEUTROS ABS: 5.3 10*3/uL (ref 1.4–7.7)
Neutrophils Relative %: 65.2 % (ref 43.0–77.0)
PLATELETS: 342 10*3/uL (ref 150.0–400.0)
RBC: 4.42 Mil/uL (ref 4.22–5.81)
RDW: 15.8 % — AB (ref 11.5–15.5)
WBC: 8.1 10*3/uL (ref 4.0–10.5)

## 2014-05-30 LAB — BASIC METABOLIC PANEL
BUN: 16 mg/dL (ref 6–23)
CHLORIDE: 108 meq/L (ref 96–112)
CO2: 28 meq/L (ref 19–32)
Calcium: 8.7 mg/dL (ref 8.4–10.5)
Creatinine, Ser: 1.2 mg/dL (ref 0.4–1.5)
GFR: 57.98 mL/min — AB (ref >60.00)
GLUCOSE: 102 mg/dL — AB (ref 70–99)
POTASSIUM: 5.4 meq/L — AB (ref 3.5–5.1)
SODIUM: 144 meq/L (ref 135–145)

## 2014-05-30 NOTE — Patient Instructions (Signed)
Discontinue furosemide  No added salt diet  Keep legs elevated as much as possible

## 2014-05-30 NOTE — Progress Notes (Signed)
Subjective:    Patient ID: Howard Payne, male    DOB: 03/10/1920, 80 y.o.   MRN: [id number]  HPI Wt Readings from Last 3 Encounters:  05/30/14 184 lb (83.462 kg)  04/24/14 180 lb 12.4 oz (82 kg)  04/19/14 193 lb (87.40 kg)    80 year old patient who presents with a chief complaint of worsening lower extremity edema.  The patient is a resident of [facility].  He was hospitalized approximately 5 weeks ago for decompensated congestive heart failure in the setting of atrial fibrillation.  He remains on chronic Coumadin anticoagulation.  Hospital course was complicated by acute respiratory failure.  Patient denies any shortness of breath, although he does have significant cognitive impairment.  He is accompanied by his daughter today.  She has noted no real clinical change. Last week.  The patient was placed on low-dose furosemide after the nurse report of worsening lower extremity edema.  Past Medical History  Diagnosis Date  . Irregular heart rate   . Macular degeneration   . Cognitive impairment   . Hypotension   . 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 Glaucoma   . Atrial fibrillation   . Dysrhythmia   . GERD (gastroesophageal reflux disease)     History   Social History  . Marital Status: Widowed    Spouse Name: N/A    Number of Children: N/A  . Years of Education: N/A   Occupational History  . Not on file.   Social History Main Topics  . Smoking status: Never Smoker   . Smokeless tobacco: Never Used  . Alcohol Use: No  . Drug Use: No  . Sexual Activity: No   Other Topics Concern  . Not on file   Social History Narrative  . No narrative on file    Past Surgical History  Procedure Laterality Date  . Mohs surgery  2006, 07/06/12    Left Lower Eyelid: Invasive Squamous Cell Carcinoma  . Shave biopsy  05/14/12    Left Lower Forehead  . Skin punch biopsy   05/14/12    Mid Left Lower Eyelid  . Eye surgery      Family History  Problem Relation Age of Onset  . Skin cancer Sister     Non-melanoma  . Stroke Mother   . Stroke Father   . Diabetes Father     No Known Allergies  Current Outpatient Prescriptions on File Prior to Visit  Medication Sig Dispense Refill  . acetaminophen (TYLENOL) 325 MG tablet Take 650 mg by mouth 3 (three) times daily.      Marland Kitchen albuterol (PROVENTIL HFA;VENTOLIN HFA) 108 (90 BASE) MCG/ACT inhaler Inhale 2 puffs into the lungs every 6 (six) hours as needed for wheezing or shortness of breath.  1 Inhaler  1  . albuterol (PROVENTIL) (2.5 MG/3ML) 0.083% nebulizer solution Take 2.5 mg by nebulization every 6 (six) hours as needed for wheezing or shortness of breath.      . diclofenac sodium (VOLTAREN) 1 % GEL Apply 2 g topically 2 (two) times daily. Apply to right knee      . digoxin (LANOXIN) 0.125 MG tablet Take 0.125 mg by mouth daily.      . diphenhydrAMINE (BENADRYL) 25 MG tablet Take 25 mg by mouth every 6 (six) hours as needed for itching.      . Eyelid Cleansers (OCUSOFT  LID SCRUB EX) Place 1 application into the left eye daily.       Marland Kitchen guaiFENesin (MUCINEX) 600 MG 12 hr tablet Take 600 mg by mouth 2 (two) times daily as needed for cough or to loosen phlegm.      Marland Kitchen guaiFENesin-codeine (ROBITUSSIN AC) 100-10 MG/5ML syrup Take 5 mLs by mouth every 6 (six) hours as needed for cough.       . latanoprost (XALATAN) 0.005 % ophthalmic solution Place 1 drop into both eyes at bedtime.      . Memantine HCl ER (NAMENDA XR) 28 MG CP24 Take 28 mg by mouth daily.      Marland Kitchen omeprazole (PRILOSEC) 20 MG capsule Take 1 capsule (20 mg total) by mouth daily.  90 capsule  3  . Polyethyl Glycol-Propyl Glycol (SYSTANE) 0.4-0.3 % GEL Place 1 application into the left eye at bedtime. On to eyelid      . polyvinyl alcohol (LIQUIFILM TEARS) 1.4 % ophthalmic solution Place 1 drop into the left eye daily as needed for dry eyes.      Marland Kitchen sertraline  (ZOLOFT) 25 MG tablet Take 1 tablet (25 mg total) by mouth daily.  30 tablet  11  . sulfacetamide (BLEPH-10) 10 % ophthalmic solution Place 2 drops into the left eye 4 (four) times daily.      Marland Kitchen tobramycin (TOBREX) 0.3 % ophthalmic solution Place 1 drop into both eyes every 4 (four) hours. Use drops for 7 days  5 mL  0  . trimethoprim-polymyxin b (POLYTRIM) ophthalmic solution Place 2 drops into the left eye 4 (four) times daily.      Dema Severin Petrolatum-Mineral Oil (PURALUBE) 85-15 % OINT Place 1 application into the left eye 3 (three) times daily.       No current facility-administered medications on file prior to visit.    BP 80/50  Pulse 122  Temp(Src) 98 F (36.7 C) (Oral)  Resp 20  Ht 6' (1.829 m)  Wt 184 lb (83.462 kg)  BMI 24.95 kg/m2  SpO2 96%     Review of Systems  Constitutional: Negative for fever, chills, appetite change and fatigue.  HENT: Negative for congestion, dental problem, ear pain, hearing loss, sore throat, tinnitus, trouble swallowing and voice change.   Eyes: Negative for pain, discharge and visual disturbance.  Respiratory: Negative for cough, chest tightness, wheezing and stridor.   Cardiovascular: Positive for leg swelling. Negative for chest pain and palpitations.  Gastrointestinal: Negative for nausea, vomiting, abdominal pain, diarrhea, constipation, blood in stool and abdominal distention.  Genitourinary: Negative for urgency, hematuria, flank pain, discharge, difficulty urinating and genital sores.  Musculoskeletal: Negative for arthralgias, back pain, gait problem, joint swelling, myalgias and neck stiffness.  Skin: Negative for rash.  Neurological: Negative for dizziness, syncope, speech difficulty, weakness, numbness and headaches.  Hematological: Negative for adenopathy. Does not bruise/bleed easily.  Psychiatric/Behavioral: Positive for confusion. Negative for behavioral problems and dysphoric mood. The patient is not nervous/anxious.          Objective:   Physical Exam  Constitutional: He is oriented to person, place, and time. He appears well-developed.  Repeat blood pressure still 80 over 50  HENT:  Head: Normocephalic.  Right Ear: External ear normal.  Left Ear: External ear normal.  Eyes: Conjunctivae and EOM are normal.  Neck: Normal range of motion.  Cardiovascular: Normal rate and normal heart sounds.   Irregular rhythm with a rate of 110  Pulmonary/Chest: Breath sounds normal.  Abdominal: Bowel sounds are  normal.  Musculoskeletal: Normal range of motion. He exhibits no edema and no tenderness.  Pitting ankle and pedal edema  Neurological: He is alert and oriented to person, place, and time.  Psychiatric: He has a normal mood and affect. His behavior is normal.          Assessment & Plan:   Lower extremity edema.  History of congestive heart failure.  Patient has no other overt signs of heart failure.  We'll discontinue furosemide in view of his hypotension Congestive heart failure. Atrial fibrillation Chronic Coumadin anticoagulation   We'll check updated labs Discontinue furosemide  Recheck 2 months

## 2014-05-30 NOTE — Progress Notes (Signed)
Pre visit review using our clinic review tool, if applicable. No additional management support is needed unless otherwise documented below in the visit note. 

## 2014-05-31 LAB — DIGOXIN LEVEL: DIGOXIN LVL: 0.4 ng/mL — AB (ref 0.8–2.0)

## 2014-06-02 LAB — PROTIME-INR: INR: 2.7 — AB (ref <=1.1)

## 2014-06-03 ENCOUNTER — Ambulatory Visit (INDEPENDENT_AMBULATORY_CARE_PROVIDER_SITE_OTHER): Payer: Medicare Other | Admitting: General Practice

## 2014-06-03 DIAGNOSIS — Z7901 Long term (current) use of anticoagulants: Secondary | ICD-10-CM

## 2014-06-03 DIAGNOSIS — Z5181 Encounter for therapeutic drug level monitoring: Secondary | ICD-10-CM

## 2014-06-03 NOTE — Progress Notes (Signed)
Pre visit review using our clinic review tool, if applicable. No additional management support is needed unless otherwise documented below in the visit note. 

## 2014-06-06 ENCOUNTER — Encounter: Payer: Self-pay | Admitting: Internal Medicine

## 2014-07-08 ENCOUNTER — Ambulatory Visit (INDEPENDENT_AMBULATORY_CARE_PROVIDER_SITE_OTHER): Payer: Medicare Other | Admitting: Family Medicine

## 2014-07-08 DIAGNOSIS — Z5181 Encounter for therapeutic drug level monitoring: Secondary | ICD-10-CM

## 2014-07-08 LAB — POCT INR: INR: 2.39

## 2014-08-04 ENCOUNTER — Ambulatory Visit (INDEPENDENT_AMBULATORY_CARE_PROVIDER_SITE_OTHER): Payer: Medicare Other | Admitting: *Deleted

## 2014-08-04 DIAGNOSIS — Z5181 Encounter for therapeutic drug level monitoring: Secondary | ICD-10-CM

## 2014-08-04 LAB — POCT INR: INR: 2.1

## 2014-08-12 IMAGING — CR DG CHEST 2V
2 series · 2 of 2 positions shown · non-contrast
Comparison: 10/31/2012

CLINICAL DATA: Cough, shortness of breath, right-sided chest pain

CHEST - 2 VIEW

[view not recorded (1 of 2)]
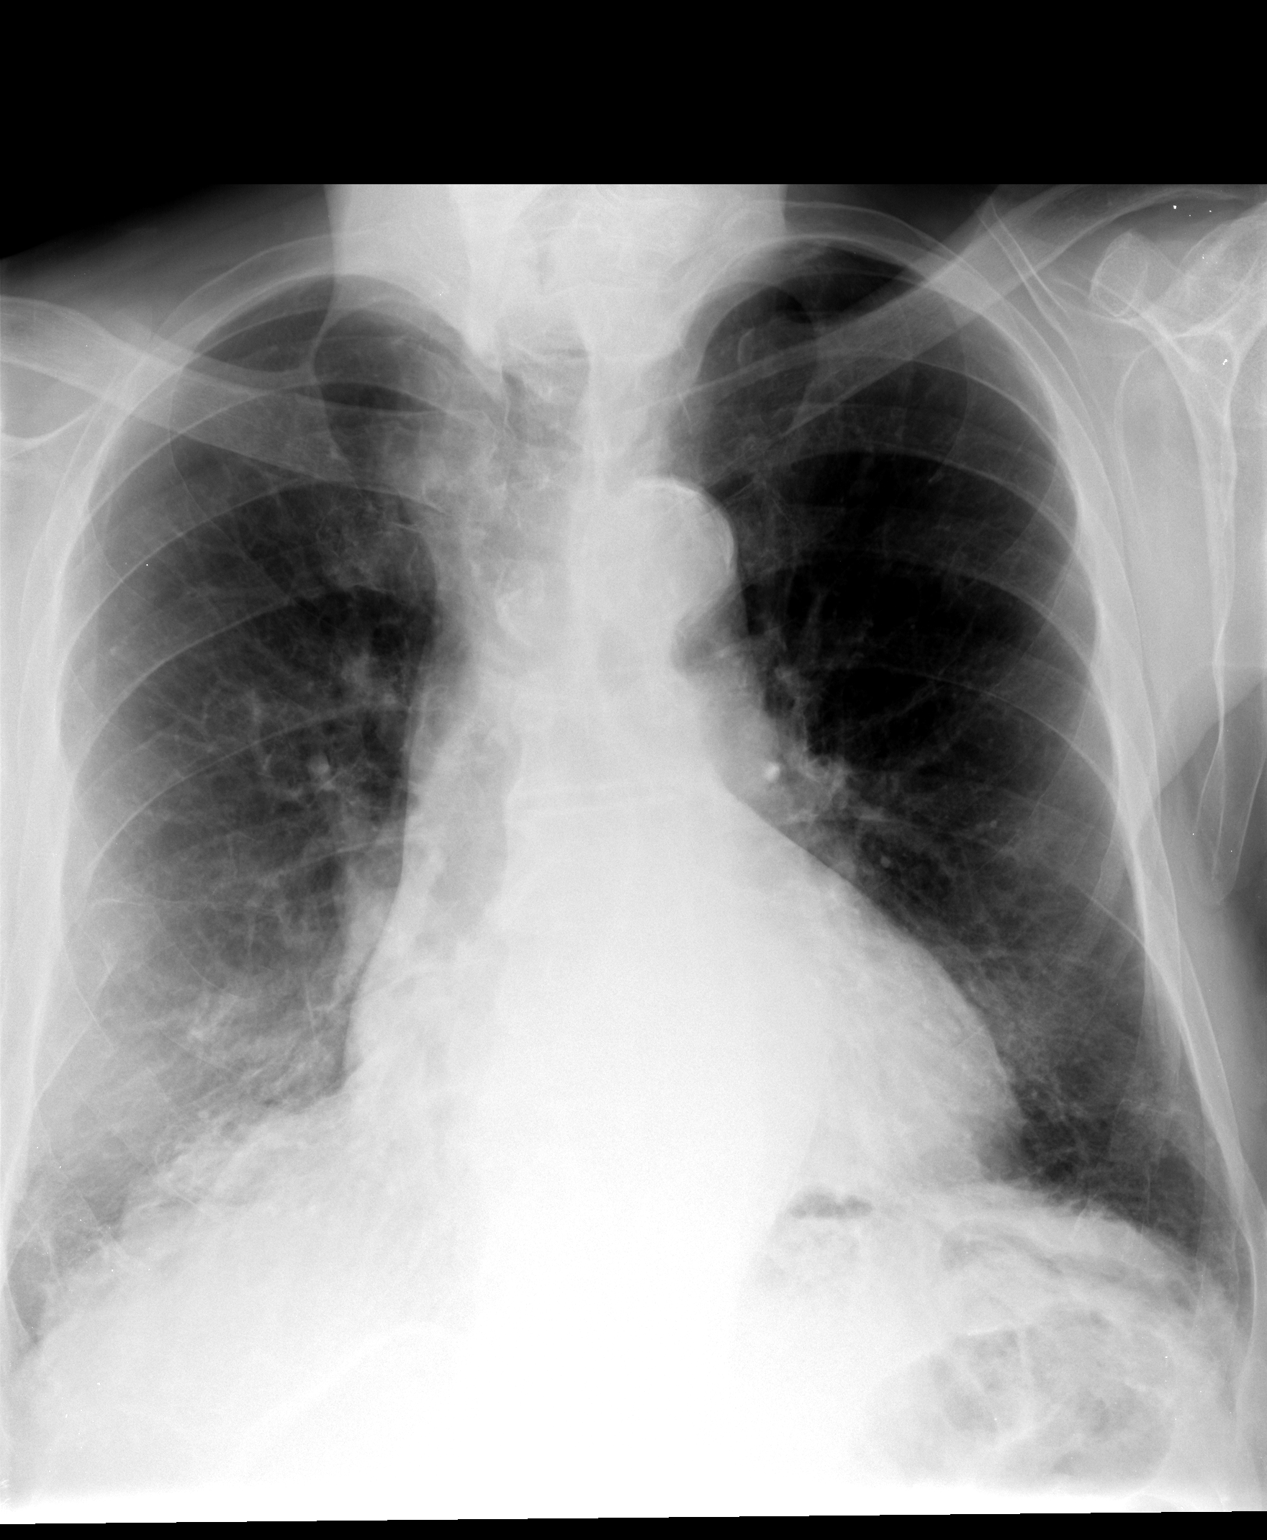

[view not recorded (2 of 2)]
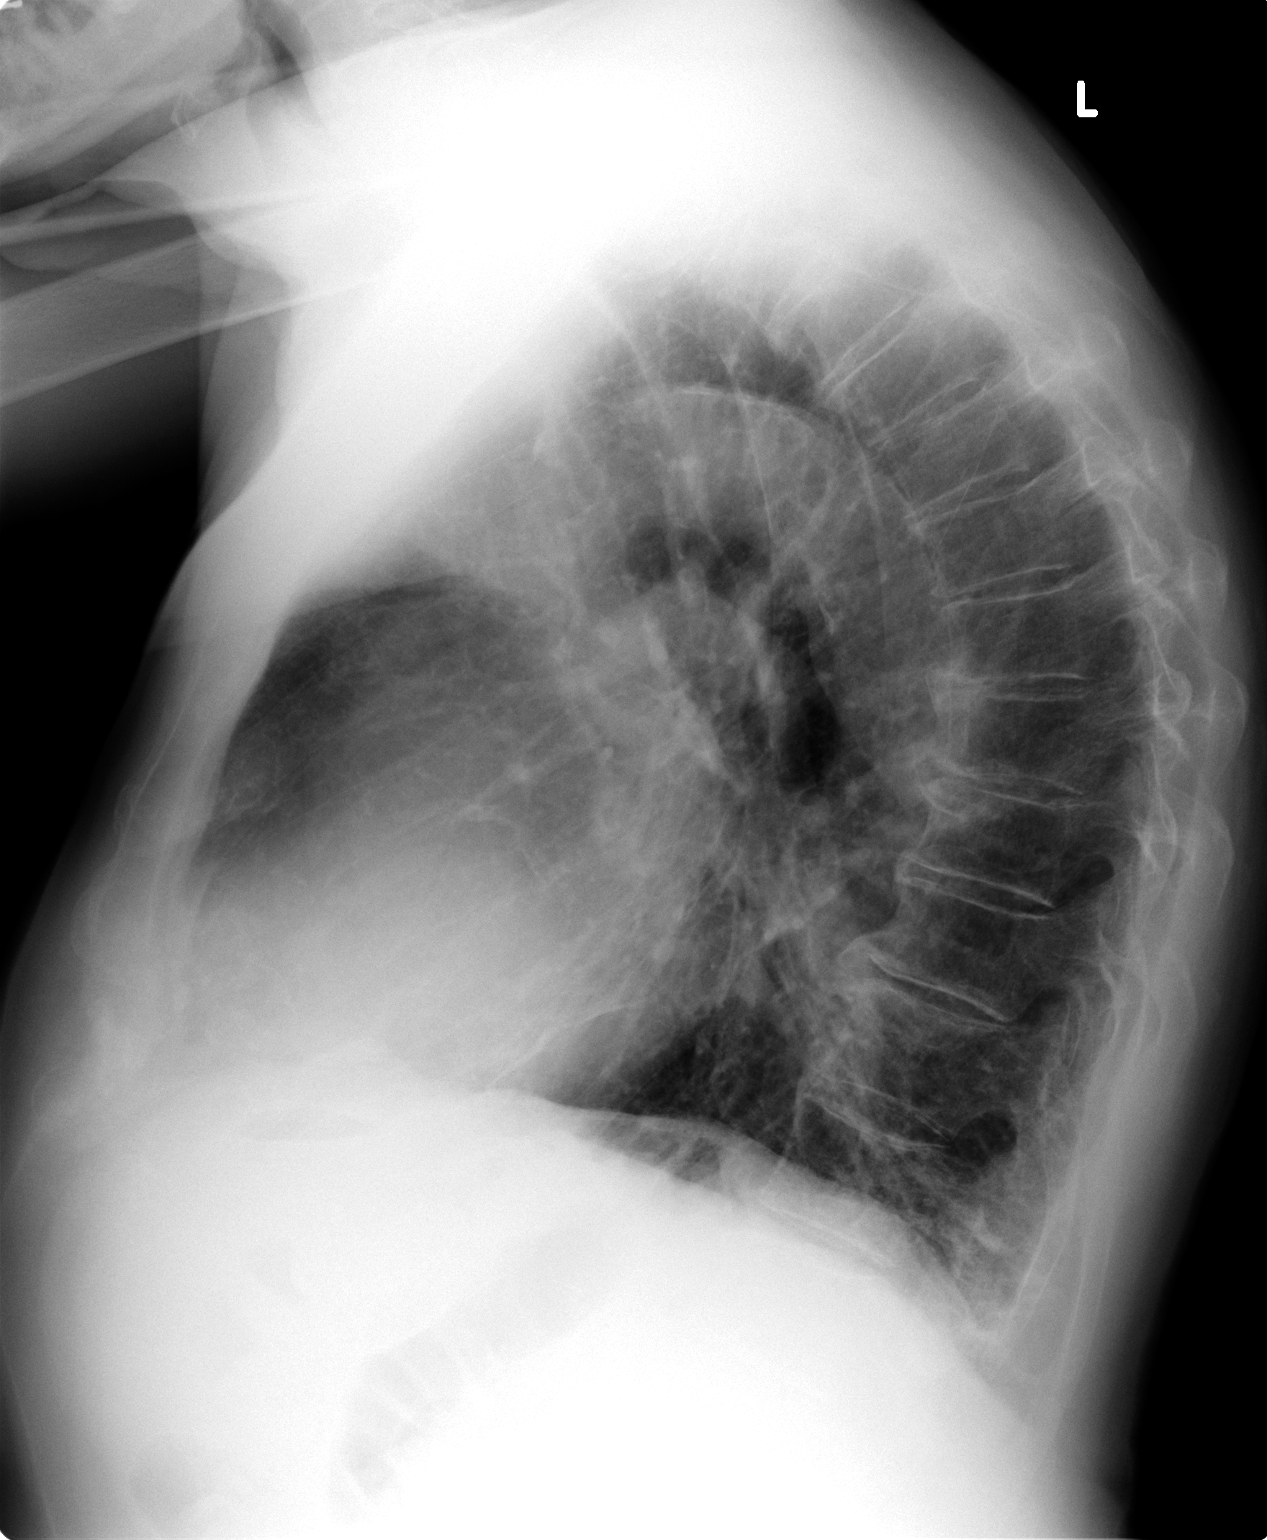

[2 of 2 positions shown; findings below may reference images not displayed]

FINDINGS: Chronic interstitial markings with bibasilar
scarring/atelectasis.  No superimposed opacities suspicious for
pneumonia.  No pleural effusion or pneumothorax.

Cardiomegaly.

Degenerative changes of the visualized thoracolumbar spine.
IMPRESSION: No evidence of acute cardiopulmonary disease.

Chronic interstitial markings with bibasilar scarring/atelectasis.

## 2014-08-24 ENCOUNTER — Telehealth: Payer: Self-pay | Admitting: Internal Medicine

## 2014-08-24 NOTE — Telephone Encounter (Signed)
Melissa states pt is having periods of sleep apnea, snoring, and L leg edema while using TED hoses.  Melissa would like to know a plan of care for pt.

## 2014-08-25 NOTE — Telephone Encounter (Signed)
Will call back tomorrow 08/26/14.  Melissa had left for the day

## 2014-08-25 NOTE — Telephone Encounter (Signed)
Suggest ROV to evaluate

## 2014-08-30 NOTE — Telephone Encounter (Signed)
Left message for pt to call back  °

## 2014-09-01 ENCOUNTER — Ambulatory Visit (INDEPENDENT_AMBULATORY_CARE_PROVIDER_SITE_OTHER): Payer: Medicare Other | Admitting: *Deleted

## 2014-09-01 DIAGNOSIS — Z5181 Encounter for therapeutic drug level monitoring: Secondary | ICD-10-CM

## 2014-09-01 LAB — POCT INR: INR: 2.2

## 2014-09-01 NOTE — Telephone Encounter (Signed)
appt scheduled

## 2014-09-02 ENCOUNTER — Telehealth: Payer: Self-pay | Admitting: Internal Medicine

## 2014-09-02 ENCOUNTER — Emergency Department (HOSPITAL_COMMUNITY): Payer: Medicare Other

## 2014-09-02 ENCOUNTER — Encounter (HOSPITAL_COMMUNITY): Payer: Self-pay | Admitting: Emergency Medicine

## 2014-09-02 ENCOUNTER — Inpatient Hospital Stay (HOSPITAL_COMMUNITY)
Admission: EM | Admit: 2014-09-02 | Discharge: 2014-09-07 | DRG: 177 | Disposition: A | Discharge status: Nursing Home (Includes ICF) | Payer: Medicare Other | Attending: Internal Medicine | Admitting: Internal Medicine

## 2014-09-02 ENCOUNTER — Telehealth: Payer: Self-pay | Admitting: *Deleted

## 2014-09-02 DIAGNOSIS — J181 Lobar pneumonia, unspecified organism: Secondary | ICD-10-CM

## 2014-09-02 DIAGNOSIS — I5033 Acute on chronic diastolic (congestive) heart failure: Secondary | ICD-10-CM | POA: Diagnosis present

## 2014-09-02 DIAGNOSIS — H3530 Unspecified macular degeneration: Secondary | ICD-10-CM | POA: Diagnosis present

## 2014-09-02 DIAGNOSIS — I4891 Unspecified atrial fibrillation: Secondary | ICD-10-CM | POA: Diagnosis present

## 2014-09-02 DIAGNOSIS — J189 Pneumonia, unspecified organism: Secondary | ICD-10-CM

## 2014-09-02 DIAGNOSIS — R0602 Shortness of breath: Secondary | ICD-10-CM | POA: Diagnosis not present

## 2014-09-02 DIAGNOSIS — Z66 Do not resuscitate: Secondary | ICD-10-CM | POA: Diagnosis present

## 2014-09-02 DIAGNOSIS — I5031 Acute diastolic (congestive) heart failure: Secondary | ICD-10-CM

## 2014-09-02 DIAGNOSIS — I482 Chronic atrial fibrillation, unspecified: Secondary | ICD-10-CM

## 2014-09-02 DIAGNOSIS — Z85828 Personal history of other malignant neoplasm of skin: Secondary | ICD-10-CM

## 2014-09-02 DIAGNOSIS — F0390 Unspecified dementia without behavioral disturbance: Secondary | ICD-10-CM | POA: Diagnosis present

## 2014-09-02 DIAGNOSIS — Z7901 Long term (current) use of anticoagulants: Secondary | ICD-10-CM

## 2014-09-02 DIAGNOSIS — Z79899 Other long term (current) drug therapy: Secondary | ICD-10-CM | POA: Diagnosis not present

## 2014-09-02 DIAGNOSIS — J9601 Acute respiratory failure with hypoxia: Secondary | ICD-10-CM | POA: Diagnosis present

## 2014-09-02 DIAGNOSIS — R4189 Other symptoms and signs involving cognitive functions and awareness: Secondary | ICD-10-CM

## 2014-09-02 DIAGNOSIS — H409 Unspecified glaucoma: Secondary | ICD-10-CM | POA: Diagnosis present

## 2014-09-02 DIAGNOSIS — J690 Pneumonitis due to inhalation of food and vomit: Secondary | ICD-10-CM | POA: Diagnosis present

## 2014-09-02 DIAGNOSIS — K219 Gastro-esophageal reflux disease without esophagitis: Secondary | ICD-10-CM | POA: Diagnosis present

## 2014-09-02 HISTORY — DX: Pneumonia, unspecified organism: J18.9

## 2014-09-02 HISTORY — DX: Unspecified dementia, unspecified severity, without behavioral disturbance, psychotic disturbance, mood disturbance, and anxiety: F03.90

## 2014-09-02 HISTORY — DX: Heart failure, unspecified: I50.9

## 2014-09-02 LAB — URINALYSIS, ROUTINE W REFLEX MICROSCOPIC
Bilirubin Urine: NEGATIVE
GLUCOSE, UA: NEGATIVE mg/dL
Ketones, ur: NEGATIVE mg/dL
LEUKOCYTES UA: NEGATIVE
Nitrite: NEGATIVE
PROTEIN: NEGATIVE mg/dL
Specific Gravity, Urine: 1.025 (ref 1.005–1.030)
Urobilinogen, UA: 0.2 mg/dL (ref 0.0–1.0)
pH: 5 (ref 5.0–8.0)

## 2014-09-02 LAB — CBC WITH DIFFERENTIAL/PLATELET
BASOS ABS: 0 10*3/uL (ref 0.0–0.1)
Basophils Relative: 0 % (ref 0–1)
Eosinophils Absolute: 0.4 10*3/uL (ref 0.0–0.7)
Eosinophils Relative: 4 % (ref 0–5)
HCT: 40.6 % (ref 39.0–52.0)
Hemoglobin: 14.1 g/dL (ref 13.0–17.0)
LYMPHS PCT: 24 % (ref 12–46)
Lymphs Abs: 2 10*3/uL (ref 0.7–4.0)
MCH: 32.9 pg (ref 26.0–34.0)
MCHC: 34.7 g/dL (ref 30.0–36.0)
MCV: 94.9 fL (ref 78.0–100.0)
Monocytes Absolute: 1.2 10*3/uL — ABNORMAL HIGH (ref 0.1–1.0)
Monocytes Relative: 15 % — ABNORMAL HIGH (ref 3–12)
NEUTROS PCT: 57 % (ref 43–77)
Neutro Abs: 4.7 10*3/uL (ref 1.7–7.7)
PLATELETS: 195 10*3/uL (ref 150–400)
RBC: 4.28 MIL/uL (ref 4.22–5.81)
RDW: 13.4 % (ref 11.5–15.5)
WBC: 8.3 10*3/uL (ref 4.0–10.5)

## 2014-09-02 LAB — COMPREHENSIVE METABOLIC PANEL
ALK PHOS: 89 U/L (ref 39–117)
ALT: 22 U/L (ref 0–53)
AST: 29 U/L (ref 0–37)
Albumin: 3.6 g/dL (ref 3.5–5.2)
Anion gap: 19 — ABNORMAL HIGH (ref 5–15)
BUN: 17 mg/dL (ref 6–23)
CO2: 20 meq/L (ref 19–32)
Calcium: 8.9 mg/dL (ref 8.4–10.5)
Chloride: 96 mEq/L (ref 96–112)
Creatinine, Ser: 0.92 mg/dL (ref 0.50–1.35)
GFR calc non Af Amer: 71 mL/min — ABNORMAL LOW (ref >90)
GFR, EST AFRICAN AMERICAN: 82 mL/min — AB (ref >90)
GLUCOSE: 150 mg/dL — AB (ref 70–99)
POTASSIUM: 4.3 meq/L (ref 3.7–5.3)
SODIUM: 135 meq/L — AB (ref 137–147)
TOTAL PROTEIN: 7.5 g/dL (ref 6.0–8.3)
Total Bilirubin: 1.1 mg/dL (ref 0.3–1.2)

## 2014-09-02 LAB — TROPONIN I: Troponin I: <0.3 ng/mL (ref <0.30)

## 2014-09-02 LAB — URINE MICROSCOPIC-ADD ON

## 2014-09-02 LAB — PRO B NATRIURETIC PEPTIDE: Pro B Natriuretic peptide (BNP): 363.3 pg/mL (ref 0–450)

## 2014-09-02 LAB — I-STAT CG4 LACTIC ACID, ED: Lactic Acid, Venous: 2.88 mmol/L — ABNORMAL HIGH (ref 0.5–2.2)

## 2014-09-02 LAB — DIGOXIN LEVEL: Digoxin Level: 0.4 ng/mL — ABNORMAL LOW (ref 0.8–2.0)

## 2014-09-02 MED ORDER — IPRATROPIUM-ALBUTEROL 0.5-2.5 (3) MG/3ML IN SOLN
3.0000 mL | Freq: Once | RESPIRATORY_TRACT | Status: AC
  Administered 2014-09-02: 3 mL via RESPIRATORY_TRACT
  Filled 2014-09-02: qty 3

## 2014-09-02 NOTE — H&P (Addendum)
[facility] History and Physical  Awab Abebe PNT:[id number] DOB: 1920-08-21 DOA: 09/02/2014  Referring physician: ED physician PCP: Nyoka Cowden, MD   Chief Complaint: dyspnea   HPI:  Pt is 80 yo male, resident of ALF, who presented to [facility] ED with main concern of several days duration of progressively worsening dyspnea, initially present with exertion and now present at rest, associated with subjective fevers, chills, cough productive of yellow sputum. Pt denies chest pain but says occasional gets discomfort with coughing spells. He denies abd or urinary concerns. oxygen saturations checked at the facility were 93 - 96% on RA.  In ED, pt noted to be hemodynamically stable, CXR findings suggestive of PNA. [facility] asked to admit to medical bed.   Assessment and Plan: Active Problems: Acute hypoxic respiratory failure - initial oxygen saturation on 2 L [place] 92%, with signs and symptoms and CXR findings c/w developing PNA - will admit to medical bed - place on broad spectrum ABX give the fact the pt is resident of ALF - provide BD's scheduled and as needed, anti tussives, oxygen via [place] - follow up on sputum culture, urine legionella and strep pneumo  Atria fibrillation - stable, rate controlled - continue Digoxin  - continue Coumadin per pharmacy  GERD - continue PPI as per home medical regimen   Radiological Exams on Admission: Dg Chest 2 View  09/02/2014   Chronic increased lung markings most notable lung bases. This appears slightly different than on the prior exam and subtle superimposed right lower lobe infiltrate cannot be excluded in the proper clinical setting.  Cardiomegaly.  Code Status: Full Family Communication: Pt at bedside Disposition Plan: Admit for further evaluation     Review of Systems:  Constitutional:Negative for diaphoresis.  HENT: Negative for hearing loss, ear pain, tinnitus and ear discharge.   Eyes: Negative for blurred vision, double  vision, photophobia, pain, discharge and redness.  Respiratory: Per HPI  Cardiovascular: Negative for chest pain, palpitations, orthopnea, claudication and leg swelling.  Gastrointestinal: Negative for nausea, vomiting and abdominal pain. Genitourinary: Negative for dysuria, urgency, frequency, hematuria and flank pain.  Musculoskeletal: Negative for myalgias, back pain, joint pain and falls.  Skin: Negative for itching and rash.  Neurological: Negative for dizziness and weakness.  Endo/Heme/Allergies: Negative for environmental allergies and polydipsia. Does not bruise/bleed easily.  Psychiatric/Behavioral: Negative for suicidal ideas. The patient is not nervous/anxious.      Past Medical History  Diagnosis Date  . Irregular heart rate   . Macular degeneration   . Cognitive impairment   . Hypotension   . 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 Glaucoma   . Atrial fibrillation   . Dysrhythmia   . GERD (gastroesophageal reflux disease)   . Dementia     Past Surgical History  Procedure Laterality Date  . Mohs surgery  2006, 07/06/12    Left Lower Eyelid: Invasive Squamous Cell Carcinoma  . Shave biopsy  05/14/12    Left Lower Forehead  . Skin punch biopsy  05/14/12    Mid Left Lower Eyelid  . Eye surgery      Social History:  reports that he has never smoked. He has never used smokeless tobacco. He reports that he does not drink alcohol or use illicit drugs.  No Known Allergies  Family History  Problem Relation Age of Onset  . Skin cancer Sister  Non-melanoma  . Stroke Mother   . Stroke Father   . Diabetes Father     Prior to Admission medications   Medication Sig Start Date End Date Taking? Authorizing Provider  acetaminophen (TYLENOL) 325 MG tablet Take 650 mg by mouth 3 (three) times daily.   Yes Historical Provider, MD  albuterol (PROVENTIL) (2.5 MG/3ML)  0.083% nebulizer solution Take 2.5 mg by nebulization every 6 (six) hours as needed for wheezing or shortness of breath.   Yes Historical Provider, MD  diclofenac sodium (VOLTAREN) 1 % GEL Apply 2 g topically 2 (two) times daily as needed (for right knee).    Yes Historical Provider, MD  digoxin (LANOXIN) 0.125 MG tablet Take 0.125 mg by mouth daily.   Yes Historical Provider, MD  diphenhydrAMINE (BENADRYL) 25 MG tablet Take 25 mg by mouth every 6 (six) hours as needed for itching.   Yes Historical Provider, MD  guaiFENesin (MUCINEX) 600 MG 12 hr tablet Take 600 mg by mouth 2 (two) times daily as needed for cough or to loosen phlegm. 10/05/13  Yes Marletta Lor, MD  latanoprost (XALATAN) 0.005 % ophthalmic solution Place 1 drop into both eyes at bedtime.   Yes Historical Provider, MD  Memantine HCl ER (NAMENDA XR) 28 MG CP24 Take 28 mg by mouth daily.   Yes Historical Provider, MD  omeprazole (PRILOSEC) 20 MG capsule Take 1 capsule (20 mg total) by mouth daily. 08/18/13  Yes Marletta Lor, MD  Polyethyl Glycol-Propyl Glycol (SYSTANE) 0.4-0.3 % GEL Place 1 application into the left eye at bedtime. On to eyelid   Yes Historical Provider, MD  polyvinyl alcohol (LIQUIFILM TEARS) 1.4 % ophthalmic solution Place 1 drop into the left eye daily as needed for dry eyes.   Yes Historical Provider, MD  sertraline (ZOLOFT) 25 MG tablet Take 1 tablet (25 mg total) by mouth daily. 09/15/12  Yes Marletta Lor, MD  warfarin (COUMADIN) 5 MG tablet Take 2.5-5 mg by mouth daily at 6 PM. Takes 5mg  on tues and sat only Takes 2.5mg  all other days   Yes Historical Provider, MD    Physical Exam: Filed Vitals:   09/02/14 2011 09/02/14 2015 09/02/14 2030 09/02/14 2157  BP:  138/66 138/64 142/71  Pulse:  80 78 78  Temp:  97.6 F (36.4 C)    TempSrc:  Oral    Resp:  14 16 13   SpO2: 99% 100% 100% 95%    Physical Exam  Constitutional: Appears well-developed and well-nourished. No distress.  HENT:  Normocephalic. External right and left ear normal. Oropharynx is clear and moist.  Eyes: Conjunctivae and EOM are normal. PERRLA, no scleral icterus.  Neck: Normal ROM. Neck supple. No JVD. No tracheal deviation. No thyromegaly.  CVS: RRR, S1/S2 +, no gallops, no carotid bruit.  Pulmonary: Diffuse bilateral rhonchi with diminished air movement at bases  Abdominal: Soft. BS +,  no distension, tenderness, rebound or guarding.  Musculoskeletal: Normal range of motion. No edema and no tenderness.  Lymphadenopathy: No lymphadenopathy noted, cervical, inguinal. Neuro: Alert. Normal reflexes, muscle tone coordination. No cranial nerve deficit. Skin: Skin is warm and dry. No rash noted. Not diaphoretic. No erythema. No pallor.  Psychiatric: Normal mood and affect. Behavior, judgment, thought content normal.   Labs on Admission:  Basic Metabolic Panel:  Recent Labs Lab 09/02/14 2018  NA 135*  K 4.3  CL 96  CO2 20  GLUCOSE 150*  BUN 17  CREATININE 0.92  CALCIUM 8.9   Liver  Function Tests:  Recent Labs Lab 09/02/14 2018  AST 29  ALT 22  ALKPHOS 89  BILITOT 1.1  PROT 7.5  ALBUMIN 3.6   CBC:  Recent Labs Lab 09/02/14 2018  WBC 8.3  NEUTROABS 4.7  HGB 14.1  HCT 40.6  MCV 94.9  PLT 195   Cardiac Enzymes:  Recent Labs Lab 09/02/14 2018  TROPONINI <0.30    EKG: Normal sinus rhythm, no ST/T wave changes  Faye Ramsay, MD  [facility] Pager [phone]  If 7PM-7AM, please contact night-coverage [email or web address] Password [email or web address] 09/02/2014, 10:54 PM

## 2014-09-02 NOTE — ED Provider Notes (Signed)
CSN: [id number]     Arrival date & time 09/02/14  2005 History   First MD Initiated Contact with Patient 09/02/14 2007     Chief Complaint  Patient presents with  . Shortness of Breath     (Consider location/radiation/quality/duration/timing/severity/associated sxs/prior Treatment) HPI Comments: Patient presents to the ER for evaluation of difficulty breathing. Patient has reportedly been ill all week. He has had cough and chest congestion. He has been treated for "bronchitis" without improvement.  Today his breathing worsens. He was noted to have dyspnea with periods of apnea while sleeping. He has had slightly diminished oxygen saturations have been 93-94% range with diffuse rhonchi. He reportedly had an x-ray performed at assisted living at did not show any acute problems. As his breathing worsened, he was sent to the ER for further evaluation.  Patient's daughter is present. She reports that the last time this happened, patient's shortness of breath was secondary to congestive heart failure, not bronchitis or infection. Patient has been noted to have increased swelling of his legs.  Patient is a 80 y.o. male presenting with shortness of breath.  Shortness of Breath Associated symptoms: cough     Past Medical History  Diagnosis Date  . Irregular heart rate   . Macular degeneration   . Cognitive impairment   . Hypotension   . 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 Glaucoma   . Atrial fibrillation   . Dysrhythmia   . GERD (gastroesophageal reflux disease)   . Dementia    Past Surgical History  Procedure Laterality Date  . Mohs surgery  2006, 07/06/12    Left Lower Eyelid: Invasive Squamous Cell Carcinoma  . Shave biopsy  05/14/12    Left Lower Forehead  . Skin punch biopsy  05/14/12    Mid Left Lower Eyelid  . Eye surgery     Family History  Problem Relation Age of  Onset  . Skin cancer Sister     Non-melanoma  . Stroke Mother   . Stroke Father   . Diabetes Father    History  Substance Use Topics  . Smoking status: Never Smoker   . Smokeless tobacco: Never Used  . Alcohol Use: No    Review of Systems  Respiratory: Positive for cough and shortness of breath.   Cardiovascular: Positive for leg swelling.  All other systems reviewed and are negative.     Allergies  Review of patient's allergies indicates no known allergies.  Home Medications   Prior to Admission medications   Medication Sig Start Date End Date Taking? Authorizing Provider  acetaminophen (TYLENOL) 325 MG tablet Take 650 mg by mouth 3 (three) times daily.   Yes Historical Provider, MD  albuterol (PROVENTIL) (2.5 MG/3ML) 0.083% nebulizer solution Take 2.5 mg by nebulization every 6 (six) hours as needed for wheezing or shortness of breath.   Yes Historical Provider, MD  diclofenac sodium (VOLTAREN) 1 % GEL Apply 2 g topically 2 (two) times daily as needed (for right knee).    Yes Historical Provider, MD  digoxin (LANOXIN) 0.125 MG tablet Take 0.125 mg by mouth daily.   Yes Historical Provider, MD  diphenhydrAMINE (BENADRYL) 25 MG tablet Take 25 mg by mouth every 6 (six) hours as needed for itching.   Yes Historical Provider, MD  guaiFENesin (MUCINEX) 600 MG 12 hr tablet Take 600 mg by mouth 2 (  two) times daily as needed for cough or to loosen phlegm. 10/05/13  Yes Marletta Lor, MD  latanoprost (XALATAN) 0.005 % ophthalmic solution Place 1 drop into both eyes at bedtime.   Yes Historical Provider, MD  Memantine HCl ER (NAMENDA XR) 28 MG CP24 Take 28 mg by mouth daily.   Yes Historical Provider, MD  omeprazole (PRILOSEC) 20 MG capsule Take 1 capsule (20 mg total) by mouth daily. 08/18/13  Yes Marletta Lor, MD  Polyethyl Glycol-Propyl Glycol (SYSTANE) 0.4-0.3 % GEL Place 1 application into the left eye at bedtime. On to eyelid   Yes Historical Provider, MD  polyvinyl  alcohol (LIQUIFILM TEARS) 1.4 % ophthalmic solution Place 1 drop into the left eye daily as needed for dry eyes.   Yes Historical Provider, MD  sertraline (ZOLOFT) 25 MG tablet Take 1 tablet (25 mg total) by mouth daily. 09/15/12  Yes Marletta Lor, MD  warfarin (COUMADIN) 5 MG tablet Take 2.5-5 mg by mouth daily at 6 PM. Takes 5mg  on tues and sat only Takes 2.5mg  all other days   Yes Historical Provider, MD   BP 127/72  Pulse 75  Temp(Src) 97.8 F (36.6 C) (Oral)  Resp 18  Wt 183 lb 12.8 oz (83.371 kg)  SpO2 100% Physical Exam  Constitutional: He is oriented to person, place, and time. He appears well-developed and well-nourished. No distress.  HENT:  Head: Normocephalic and atraumatic.  Right Ear: Hearing normal.  Left Ear: Hearing normal.  Nose: Nose normal.  Mouth/Throat: Oropharynx is clear and moist and mucous membranes are normal.  Eyes: Conjunctivae and EOM are normal. Pupils are equal, round, and reactive to light.  Neck: Normal range of motion. Neck supple.  Cardiovascular: Regular rhythm, S1 normal and S2 normal.  Exam reveals no gallop and no friction rub.   No murmur heard. Pulmonary/Chest: Effort normal and breath sounds normal. No respiratory distress. He exhibits no tenderness.  Abdominal: Soft. Normal appearance and bowel sounds are normal. There is no hepatosplenomegaly. There is no tenderness. There is no rebound, no guarding, no tenderness at McBurney's point and negative Murphy's sign. No hernia.  Musculoskeletal: Normal range of motion.  Neurological: He is alert and oriented to person, place, and time. He has normal strength. No cranial nerve deficit or sensory deficit. Coordination normal. GCS eye subscore is 4. GCS verbal subscore is 5. GCS motor subscore is 6.  Skin: Skin is warm, dry and intact. No rash noted. No cyanosis.  Psychiatric: He has a normal mood and affect. His speech is normal and behavior is normal. Thought content normal.    ED Course   Procedures (including critical care time) Labs Review Labs Reviewed  MRSA PCR SCREENING - Abnormal; Notable for the following:    MRSA by PCR POSITIVE (*)    All other components within normal limits  CBC WITH DIFFERENTIAL - Abnormal; Notable for the following:    Monocytes Relative 15 (*)    Monocytes Absolute 1.2 (*)    All other components within normal limits  COMPREHENSIVE METABOLIC PANEL - Abnormal; Notable for the following:    Sodium 135 (*)    Glucose, Bld 150 (*)    GFR calc non Af Amer 71 (*)    GFR calc Af Amer 82 (*)    Anion gap 19 (*)    All other components within normal limits  URINALYSIS, ROUTINE W REFLEX MICROSCOPIC - Abnormal; Notable for the following:    APPearance TURBID (*)  Hgb urine dipstick LARGE (*)    All other components within normal limits  DIGOXIN LEVEL - Abnormal; Notable for the following:    Digoxin Level 0.4 (*)    All other components within normal limits  BASIC METABOLIC PANEL - Abnormal; Notable for the following:    Sodium 132 (*)    Chloride 95 (*)    Glucose, Bld 189 (*)    GFR calc non Af Amer 76 (*)    GFR calc Af Amer 89 (*)    Anion gap 18 (*)    All other components within normal limits  PROTIME-INR - Abnormal; Notable for the following:    Prothrombin Time 24.9 (*)    INR 2.23 (*)    All other components within normal limits  BLOOD GAS, ARTERIAL - Abnormal; Notable for the following:    pCO2 arterial 31.6 (*)    pO2, Arterial 187.0 (*)    Bicarbonate 19.4 (*)    Acid-base deficit 4.7 (*)    All other components within normal limits  I-STAT CG4 LACTIC ACID, ED - Abnormal; Notable for the following:    Lactic Acid, Venous 2.88 (*)    All other components within normal limits  URINE CULTURE  CULTURE, BLOOD (ROUTINE X 2)  CULTURE, BLOOD (ROUTINE X 2)  CULTURE, EXPECTORATED SPUTUM-ASSESSMENT  GRAM STAIN  TROPONIN I  PRO B NATRIURETIC PEPTIDE  URINE MICROSCOPIC-ADD ON  STREP PNEUMONIAE URINARY ANTIGEN  CBC   LEGIONELLA ANTIGEN, URINE  PROTIME-INR    Imaging Review Dg Chest 2 View  09/02/2014   CLINICAL DATA:  80 year old male with nonproductive cough for the past week. Initial encounter.  EXAM: CHEST  2 VIEW  COMPARISON:  04/24/2014 and 04/21/2014.  FINDINGS: Chronic increased lung markings most notable lung bases. This appears slightly different than on the prior exam and subtle superimposed right lower lobe infiltrate cannot be excluded in the proper clinical setting.  Cardiomegaly.  Central pulmonary vascular prominence.  No gross pneumothorax.  Calcified tortuous aorta  IMPRESSION: Chronic increased lung markings most notable lung bases. This appears slightly different than on the prior exam and subtle superimposed right lower lobe infiltrate cannot be excluded in the proper clinical setting.  Cardiomegaly.  Calcified tortuous aorta   Electronically Signed   By: Chauncey Cruel M.D.   On: 09/02/2014 21:08   Dg Chest Port 1 View  09/03/2014   CLINICAL DATA:  Shortness of breath.  EXAM: PORTABLE CHEST - 1 VIEW  COMPARISON:  09/02/2014 and 04/24/2014  FINDINGS: Patient is slightly rotated to the right. Lungs are adequately inflated with mild worsening bibasilar opacification right worse than left as cannot exclude infection. No definite effusion. Mild stable cardiomegaly. Calcified plaque over the thoracic aorta. Degenerative changes of the spine.  IMPRESSION: Slight worsening bibasilar opacification right worse than left as cannot exclude developing infection.  Stable cardiomegaly.   Electronically Signed   By: Marin Olp M.D.   On: 09/03/2014 07:45     EKG Interpretation   Date/Time:  Friday September 02 2014 20:18:11 EDT Ventricular Rate:  79 PR Interval:  81 QRS Duration: 85 QT Interval:  415 QTC Calculation: 476 R Axis:   44 Text Interpretation:  Sinus or ectopic atrial rhythm Short PR interval  Minimal ST depression, diffuse leads Borderline prolonged QT interval No  significant change  since last tracing Confirmed by Aizah Gehlhausen  MD,  [place] [phone]) on 09/02/2014 8:41:43 PM      MDM   Final diagnoses:  SOB (shortness  of breath)  Acute respiratory failure with hypoxia  Acute diastolic congestive heart failure  Right lower lobe pneumonia   Presents to the ER for evaluation of difficulty breathing. Patient had significant bronchospasm and tachypnea with dyspnea parietal. This improved with nebulizer treatment by EMS, the patient became dyspneic once again. He started to have oxygen requirement. X-ray concerning for early pneumonia. This will be admitted to the hospital for further management.    Orpah Greek, MD 09/03/14 1714

## 2014-09-02 NOTE — ED Notes (Signed)
Attempted report x1. 

## 2014-09-02 NOTE — ED Notes (Signed)
Dr Betsey Holiday given a copy of lactic acid results 2.88

## 2014-09-02 NOTE — Telephone Encounter (Signed)
Dorian Pod would like to know if Dr. Raliegh Ip can change pt's breathing treatment from PRN to  Scheduled treatments??  She said she was advised that pt sounds really bad and he should have scheduled breathing treatments that are more frequent than PRN to help him get through the weekend till his appointment.  Dorian Pod would like a callback to advise her if this request.

## 2014-09-02 NOTE — Telephone Encounter (Signed)
Spoke to pt's daughter Howard Payne, told her Dr.K is out of the office but Dr. Yong Channel is going to sign order for Breathing treatments to be every 6 hours for pt. I will fax order over. Howard Payne verbalized understanding and stated okay pt has an appt on Monday. Told her okay. Order faxed to [facility] at [phone] and [phone].

## 2014-09-02 NOTE — ED Notes (Signed)
Per EMS: Pt from [facility] facility for eval of sob and non productive cough x1 week. EMS reports rhonchi in lower lobes, given 3 albuterol treatments at facility and 2 neb treatments en route to ED. EMS denies any distress, pt had mobile chest xray done today at facility that was normal. Nad noted upon arrival. Pt hx of dementia.

## 2014-09-02 NOTE — Telephone Encounter (Signed)
Melissa called from [facility] regarding pt c/o cough, SOB and rhonchi and wheezing throughout lungs reguesting order for Portable Chest x-ray for pt. Told Melissa that is fine, okay to do x-ray.

## 2014-09-02 NOTE — Telephone Encounter (Signed)
Has questions about pt's care and why certain things were done.  What are the expectations for the home.

## 2014-09-03 ENCOUNTER — Inpatient Hospital Stay (HOSPITAL_COMMUNITY): Payer: Medicare Other

## 2014-09-03 DIAGNOSIS — I5031 Acute diastolic (congestive) heart failure: Secondary | ICD-10-CM

## 2014-09-03 DIAGNOSIS — J189 Pneumonia, unspecified organism: Secondary | ICD-10-CM

## 2014-09-03 LAB — CBC
HCT: 40.7 % (ref 39.0–52.0)
Hemoglobin: 13.8 g/dL (ref 13.0–17.0)
MCH: 32.2 pg (ref 26.0–34.0)
MCHC: 33.9 g/dL (ref 30.0–36.0)
MCV: 94.9 fL (ref 78.0–100.0)
Platelets: 205 10*3/uL (ref 150–400)
RBC: 4.29 MIL/uL (ref 4.22–5.81)
RDW: 13.4 % (ref 11.5–15.5)
WBC: 6.6 10*3/uL (ref 4.0–10.5)

## 2014-09-03 LAB — BASIC METABOLIC PANEL
ANION GAP: 18 — AB (ref 5–15)
BUN: 15 mg/dL (ref 6–23)
CO2: 19 mEq/L (ref 19–32)
Calcium: 9.1 mg/dL (ref 8.4–10.5)
Chloride: 95 mEq/L — ABNORMAL LOW (ref 96–112)
Creatinine, Ser: 0.77 mg/dL (ref 0.50–1.35)
GFR calc Af Amer: 89 mL/min — ABNORMAL LOW (ref >90)
GFR, EST NON AFRICAN AMERICAN: 76 mL/min — AB (ref >90)
GLUCOSE: 189 mg/dL — AB (ref 70–99)
Potassium: 4.7 mEq/L (ref 3.7–5.3)
Sodium: 132 mEq/L — ABNORMAL LOW (ref 137–147)

## 2014-09-03 LAB — BLOOD GAS, ARTERIAL
Acid-base deficit: 4.7 mmol/L — ABNORMAL HIGH (ref 0.0–2.0)
BICARBONATE: 19.4 meq/L — AB (ref 20.0–24.0)
Drawn by: 234041
O2 Content: 10 L/min
O2 Saturation: 99.2 %
PCO2 ART: 31.6 mmHg — AB (ref 35.0–45.0)
Patient temperature: 97.5
TCO2: 20.4 mmol/L (ref 0–100)
pH, Arterial: 7.401 (ref 7.350–7.450)
pO2, Arterial: 187 mmHg — ABNORMAL HIGH (ref 80.0–100.0)

## 2014-09-03 LAB — PROTIME-INR
INR: 2.23 — ABNORMAL HIGH (ref 0.00–1.49)
Prothrombin Time: 24.9 seconds — ABNORMAL HIGH (ref 11.6–15.2)

## 2014-09-03 LAB — MRSA PCR SCREENING: MRSA BY PCR: POSITIVE — AB

## 2014-09-03 LAB — STREP PNEUMONIAE URINARY ANTIGEN: STREP PNEUMO URINARY ANTIGEN: NEGATIVE

## 2014-09-03 MED ORDER — ONDANSETRON HCL 4 MG/2ML IJ SOLN: 4.0000 mg | Freq: Four times a day (QID) | INTRAMUSCULAR | Status: DC | PRN

## 2014-09-03 MED ORDER — WARFARIN SODIUM 2.5 MG PO TABS: 2.5000 mg | ORAL_TABLET | Freq: Every day | ORAL | Status: DC

## 2014-09-03 MED ORDER — FUROSEMIDE 10 MG/ML IJ SOLN
20.0000 mg | Freq: Once | INTRAMUSCULAR | Status: AC
  Administered 2014-09-03: 20 mg via INTRAVENOUS
  Filled 2014-09-03: qty 2

## 2014-09-03 MED ORDER — WARFARIN SODIUM 2.5 MG PO TABS
2.5000 mg | ORAL_TABLET | ORAL | Status: DC
  Administered 2014-09-04: 2.5 mg via ORAL
  Filled 2014-09-03 (×2): qty 1

## 2014-09-03 MED ORDER — WHITE PETROLATUM GEL
Status: AC
  Administered 2014-09-03: 18:00:00
  Filled 2014-09-03: qty 5

## 2014-09-03 MED ORDER — ACETAMINOPHEN 325 MG PO TABS
650.0000 mg | ORAL_TABLET | Freq: Three times a day (TID) | ORAL | Status: DC
  Administered 2014-09-03 – 2014-09-07 (×12): 650 mg via ORAL
  Filled 2014-09-03 (×13): qty 2

## 2014-09-03 MED ORDER — CHLORHEXIDINE GLUCONATE CLOTH 2 % EX PADS
6.0000 | MEDICATED_PAD | Freq: Every day | CUTANEOUS | Status: AC
  Administered 2014-09-03 – 2014-09-07 (×5): 6 via TOPICAL

## 2014-09-03 MED ORDER — DIGOXIN 125 MCG PO TABS
0.1250 mg | ORAL_TABLET | Freq: Every day | ORAL | Status: DC
  Administered 2014-09-03 – 2014-09-07 (×5): 0.125 mg via ORAL
  Filled 2014-09-03 (×5): qty 1

## 2014-09-03 MED ORDER — PANTOPRAZOLE SODIUM 40 MG PO TBEC
40.0000 mg | DELAYED_RELEASE_TABLET | Freq: Every day | ORAL | Status: DC
  Administered 2014-09-03 – 2014-09-07 (×5): 40 mg via ORAL
  Filled 2014-09-03 (×5): qty 1

## 2014-09-03 MED ORDER — MUPIROCIN 2 % EX OINT
1.0000 "application " | TOPICAL_OINTMENT | Freq: Two times a day (BID) | CUTANEOUS | Status: DC
  Administered 2014-09-03 – 2014-09-07 (×9): 1 via NASAL
  Filled 2014-09-03 (×2): qty 22

## 2014-09-03 MED ORDER — POLYVINYL ALCOHOL 1.4 % OP SOLN: 1.0000 [drp] | Freq: Every day | OPHTHALMIC | Status: DC | PRN

## 2014-09-03 MED ORDER — ALBUTEROL SULFATE (2.5 MG/3ML) 0.083% IN NEBU
2.5000 mg | INHALATION_SOLUTION | RESPIRATORY_TRACT | Status: DC | PRN
Start: 2014-09-03 — End: 2014-09-07
  Administered 2014-09-03: 2.5 mg via RESPIRATORY_TRACT
  Filled 2014-09-03 (×2): qty 3

## 2014-09-03 MED ORDER — VANCOMYCIN HCL 10 G IV SOLR
1500.0000 mg | INTRAVENOUS | Status: DC
  Administered 2014-09-03 – 2014-09-06 (×4): 1500 mg via INTRAVENOUS
  Filled 2014-09-03 (×5): qty 1500

## 2014-09-03 MED ORDER — LORAZEPAM 2 MG/ML IJ SOLN
0.5000 mg | INTRAMUSCULAR | Status: DC | PRN
  Administered 2014-09-03: 0.5 mg via INTRAVENOUS
  Filled 2014-09-03: qty 1

## 2014-09-03 MED ORDER — WARFARIN SODIUM 5 MG PO TABS
5.0000 mg | ORAL_TABLET | ORAL | Status: DC
  Administered 2014-09-03: 5 mg via ORAL
  Filled 2014-09-03: qty 1

## 2014-09-03 MED ORDER — MEMANTINE HCL ER 28 MG PO CP24
28.0000 mg | ORAL_CAPSULE | Freq: Every day | ORAL | Status: DC
  Administered 2014-09-03 – 2014-09-07 (×5): 28 mg via ORAL
  Filled 2014-09-03 (×5): qty 28

## 2014-09-03 MED ORDER — ALBUTEROL SULFATE (2.5 MG/3ML) 0.083% IN NEBU
2.5000 mg | INHALATION_SOLUTION | Freq: Four times a day (QID) | RESPIRATORY_TRACT | Status: DC
  Administered 2014-09-03 – 2014-09-06 (×14): 2.5 mg via RESPIRATORY_TRACT
  Filled 2014-09-03 (×15): qty 3

## 2014-09-03 MED ORDER — WARFARIN - PHARMACIST DOSING INPATIENT: Freq: Every day | Status: DC

## 2014-09-03 MED ORDER — SERTRALINE HCL 25 MG PO TABS
25.0000 mg | ORAL_TABLET | Freq: Every day | ORAL | Status: DC
  Administered 2014-09-03 – 2014-09-07 (×5): 25 mg via ORAL
  Filled 2014-09-03 (×5): qty 1

## 2014-09-03 MED ORDER — DEXTROSE 5 % IV SOLN
1.0000 g | Freq: Two times a day (BID) | INTRAVENOUS | Status: DC
  Administered 2014-09-03 – 2014-09-07 (×10): 1 g via INTRAVENOUS
  Filled 2014-09-03 (×11): qty 1

## 2014-09-03 MED ORDER — VANCOMYCIN HCL 10 G IV SOLR
1250.0000 mg | INTRAVENOUS | Status: DC
  Administered 2014-09-03: 1250 mg via INTRAVENOUS
  Filled 2014-09-03: qty 1250

## 2014-09-03 MED ORDER — GUAIFENESIN-DM 100-10 MG/5ML PO SYRP
5.0000 mL | ORAL_SOLUTION | ORAL | Status: DC | PRN
  Administered 2014-09-03 – 2014-09-06 (×2): 5 mL via ORAL
  Filled 2014-09-03 (×2): qty 5

## 2014-09-03 MED ORDER — POLYVINYL ALCOHOL 1.4 % OP SOLN
1.0000 [drp] | Freq: Every day | OPHTHALMIC | Status: DC
  Administered 2014-09-03 – 2014-09-06 (×5): 1 [drp] via OPHTHALMIC
  Filled 2014-09-03: qty 15

## 2014-09-03 MED ORDER — DM-GUAIFENESIN ER 30-600 MG PO TB12
1.0000 | ORAL_TABLET | Freq: Two times a day (BID) | ORAL | Status: DC
  Administered 2014-09-03 – 2014-09-07 (×10): 1 via ORAL
  Filled 2014-09-03 (×12): qty 1

## 2014-09-03 MED ORDER — LATANOPROST 0.005 % OP SOLN
1.0000 [drp] | Freq: Every day | OPHTHALMIC | Status: DC
  Administered 2014-09-03 – 2014-09-06 (×5): 1 [drp] via OPHTHALMIC
  Filled 2014-09-03: qty 2.5

## 2014-09-03 MED ORDER — DIPHENHYDRAMINE HCL 25 MG PO CAPS
25.0000 mg | ORAL_CAPSULE | Freq: Four times a day (QID) | ORAL | Status: DC | PRN
  Filled 2014-09-03: qty 1

## 2014-09-03 MED ORDER — GUAIFENESIN ER 600 MG PO TB12
600.0000 mg | ORAL_TABLET | Freq: Two times a day (BID) | ORAL | Status: DC | PRN
  Administered 2014-09-03: 600 mg via ORAL
  Filled 2014-09-03 (×3): qty 1

## 2014-09-03 NOTE — Progress Notes (Signed)
ANTICOAGULATION CONSULT NOTE - Initial Consult  Pharmacy Consult for Warfarin  Indication: atrial fibrillation  Labs:  Recent Labs  09/01/14 1653 09/02/14 2018  HGB  --  14.1  HCT  --  40.6  PLT  --  195  INR 2.2  --   CREATININE  --  0.92  TROPONINI  --  <0.30    Assessment: Warfarin PTA for afib, PTA dose is 5 mg on Tues/Sat and 2.5 mg all other days. No admit INR.   Goal of Therapy:  INR 2-3 Monitor platelets by anticoagulation protocol: Yes   Plan:  -Assess INR with AM labs -Daily PT/INR, adjust dose as necessary -Monitor for bleeding  Narda Bonds 09/03/2014,12:53 AM

## 2014-09-03 NOTE — ED Notes (Signed)
Transporting patient to new room assignment. 

## 2014-09-03 NOTE — Progress Notes (Signed)
[facility] PROGRESS NOTE  Howard Payne FFM:[id number] DOB: 1920-03-15 DOA: 09/02/2014 PCP: Nyoka Cowden, MD  Assessment/Plan: 80 y/o male with PMH of A fib, HTN, CHF (probable diastolic) presented with SOB, DOE, fevers, chills, cough productive of yellow sputum -admitted with Pneumonia   1. Pneumonia; CXR: right lower lobe infiltrate -cont IV atx, bronchodilators, oxygen, pend blood cultures  2. Acute hypoxic respiratory failure on admission due to pneumonia  -cont as above; ABG 7.4-31-187-19 3. Acute on chronic CHF (diastolic); Pt is mild hypervolemic clinically;  -one dose of IV lasix; close monitor, I/O; daily weight; urine output  4. A fib on coumadin; cont BB, coumadin; (will d/w family Pt may not be a good candidate for coumadin due to fall) 5. Dementia, confused at baseline; at risk for delirium; close monitor   D/w patient, confirmed with her daughter  -Pt is DNR;   Code Status: DNR Family Communication: d/w patient, his daughter  (indicate person spoken with, relationship, and if by phone, the number) Disposition Plan: pend clinical improvement    Consultants:  none  Procedures:  none  Antibiotics:  Cefepime 10/16<<<  vanc 10/16<<<   (indicate start date, and stop date if known)  HPI/Subjective: Alert, confused at baseline   Objective: Filed Vitals:   09/03/14 0507  BP: 144/69  Pulse: 74  Temp: 98.3 F (36.8 C)  Resp: 19    Intake/Output Summary (Last 24 hours) at 09/03/14 1018 Last data filed at 09/03/14 0300  Gross per 24 hour  Intake      0 ml  Output    100 ml  Net   -100 ml   Filed Weights   09/03/14 0031  Weight: 83.371 kg (183 lb 12.8 oz)    Exam:   General:  Alert, confused at baseline   Cardiovascular: s1,s2 rrr  Respiratory: rales BL LL  Abdomen: soft, nt,nd   Musculoskeletal: mild pedal edema    Data Reviewed: Basic Metabolic Panel:  Recent Labs Lab 09/02/14 2018 09/03/14 0333  NA 135* 132*   K 4.3 4.7  CL 96 95*  CO2 20 19  GLUCOSE 150* 189*  BUN 17 15  CREATININE 0.92 0.77  CALCIUM 8.9 9.1   Liver Function Tests:  Recent Labs Lab 09/02/14 2018  AST 29  ALT 22  ALKPHOS 89  BILITOT 1.1  PROT 7.5  ALBUMIN 3.6   No results found for this basename: LIPASE, AMYLASE,  in the last 168 hours No results found for this basename: AMMONIA,  in the last 168 hours CBC:  Recent Labs Lab 09/02/14 2018 09/03/14 0333  WBC 8.3 6.6  NEUTROABS 4.7  --   HGB 14.1 13.8  HCT 40.6 40.7  MCV 94.9 94.9  PLT 195 205   Cardiac Enzymes:  Recent Labs Lab 09/02/14 2018  TROPONINI <0.30   BNP (last 3 results)  Recent Labs  04/23/14 1250 09/02/14 2018  PROBNP 841.6* 363.3   CBG: No results found for this basename: GLUCAP,  in the last 168 hours  Recent Results (from the past 240 hour(s))  MRSA PCR SCREENING     Status: Abnormal   Collection Time    09/03/14  2:04 AM      Result Value Ref Range Status   MRSA by PCR POSITIVE (*) NEGATIVE Final   Comment:            The GeneXpert MRSA Assay (FDA     approved for NASAL specimens     only), is one component of  a     comprehensive MRSA colonization     surveillance program. It is not     intended to diagnose MRSA     infection nor to guide or     monitor treatment for     MRSA infections.     RESULT CALLED TO, READ BACK BY AND VERIFIED WITH:     CALLED TO RN Earvin Hansen [phone] @[phone]  THANEY     Studies: Dg Chest 2 View  09/02/2014   CLINICAL DATA:  80 year old male with nonproductive cough for the past week. Initial encounter.  EXAM: CHEST  2 VIEW  COMPARISON:  04/24/2014 and 04/21/2014.  FINDINGS: Chronic increased lung markings most notable lung bases. This appears slightly different than on the prior exam and subtle superimposed right lower lobe infiltrate cannot be excluded in the proper clinical setting.  Cardiomegaly.  Central pulmonary vascular prominence.  No gross pneumothorax.  Calcified tortuous  aorta  IMPRESSION: Chronic increased lung markings most notable lung bases. This appears slightly different than on the prior exam and subtle superimposed right lower lobe infiltrate cannot be excluded in the proper clinical setting.  Cardiomegaly.  Calcified tortuous aorta   Electronically Signed   By: Chauncey Cruel M.D.   On: 09/02/2014 21:08   Dg Chest Port 1 View  09/03/2014   CLINICAL DATA:  Shortness of breath.  EXAM: PORTABLE CHEST - 1 VIEW  COMPARISON:  09/02/2014 and 04/24/2014  FINDINGS: Patient is slightly rotated to the right. Lungs are adequately inflated with mild worsening bibasilar opacification right worse than left as cannot exclude infection. No definite effusion. Mild stable cardiomegaly. Calcified plaque over the thoracic aorta. Degenerative changes of the spine.  IMPRESSION: Slight worsening bibasilar opacification right worse than left as cannot exclude developing infection.  Stable cardiomegaly.   Electronically Signed   By: Marin Olp M.D.   On: 09/03/2014 07:45    Scheduled Meds: . acetaminophen  650 mg Oral 3 times per day  . albuterol  2.5 mg Nebulization QID  . ceFEPime (MAXIPIME) IV  1 g Intravenous BID  . Chlorhexidine Gluconate Cloth  6 each Topical Q0600  . dextromethorphan-guaiFENesin  1 tablet Oral BID  . digoxin  0.125 mg Oral Daily  . latanoprost  1 drop Both Eyes QHS  . Memantine HCl ER  28 mg Oral Daily  . mupirocin ointment  1 application Nasal BID  . pantoprazole  40 mg Oral Daily  . polyvinyl alcohol  1 drop Left Eye QHS  . sertraline  25 mg Oral Daily  . vancomycin  1,500 mg Intravenous Q24H  . warfarin  5 mg Oral Once per day on Tue Sat   And  . [START ON 09/04/2014] warfarin  2.5 mg Oral Once per day on Sun Mon Wed Thu Fri  . Warfarin - Pharmacist Dosing Inpatient   Does not apply q1800   Continuous Infusions:   Active Problems:   Pneumonia    Time spent: >35 minutes     Kinnie Feil  [facility] Pager [phone]. If  7PM-7AM, please contact night-coverage at [email or web address], password [facility] 09/03/2014, 10:18 AM  LOS: 1 day

## 2014-09-03 NOTE — Significant Event (Signed)
Rapid Response Event Note  Overview: Time Called: 0655 Arrival Time: 0700 Event Type: Respiratory  Initial Focused Assessment:  Notified by Respiratory therapist that patient is in respiratory distress.  Upon arrival to patient room, RN at bedside. Patient sitting up in bed on breathing treatment.  RR 30's with increased WOB, Patient lungs rhonchus with scattered crackles.  Sat 97% 122/78, HR 71.  Patient acknowledges it is hard to breath.   Interventions:  PCXR and ABG ordered as per protocol.  MD notified and updated.  After breathing treatment RR 24 sats 97% on  3 lpm.   Event Summary:   RN to call if assistance needed   at      at          [facility], [place]

## 2014-09-03 NOTE — Plan of Care (Signed)
Problem: Phase I Progression Outcomes Goal: Voiding-avoid urinary catheter unless indicated Outcome: Completed/Met Date Met:  09/03/14 Voiding well.

## 2014-09-03 NOTE — Progress Notes (Signed)
ANTICOAGULATION & ANTIBIOTIC CONSULT NOTE -  Pharmacy Consult for Warfarin and vancomycin Indication: atrial fibrillation and PNA  Labs:  Recent Labs  09/01/14 1653 09/02/14 2018 09/03/14 0333  HGB  --  14.1 13.8  HCT  --  40.6 40.7  PLT  --  195 205  LABPROT  --   --  24.9*  INR 2.2  --  2.23*  CREATININE  --  0.92 0.77  TROPONINI  --  <0.30  --     Assessment: Warfarin PTA for afib, PTA dose is 5 mg on Tues/Sat and 2.5 mg all other days.  INR today is 2.23 which is therapeutic.  No bleeding reported.  Last dose 10/15, missed dose 10/16 due to being in ED. Vanc/cefepime for r/o PNA. WBC 6.6, AF, creat 0.77, 10/17 CXR: Slight worsening bibasilar opacification right worse than left as cannot exclude developing infection.  Goal of Therapy:  INR 2-3 Monitor platelets by anticoagulation protocol: Yes vanc trough 15-20 mcg/ml   Plan:  -resume home coumadin dose of 2.5 mg qday x 5 mg Tues/Sat -increase vancomycin to 1500 mg IV q24 -continues on cefepime 1 gm IV q12h -daily INR -Trend WBC, temp, renal function  -Drug levels as indicated   Eudelia Bunch, Pharm.D. [phone] 09/03/2014 9:07 AM

## 2014-09-03 NOTE — Progress Notes (Signed)
ANTIBIOTIC CONSULT NOTE - INITIAL  Pharmacy Consult for Vancomycin  Indication: rule out pneumonia  No Known Allergies  Patient Measurements: Weight: 183 lb 12.8 oz (83.371 kg)  Vital Signs: Temp: 98.6 F (37 C) (10/17 0031) Temp Source: Oral (10/17 0031) BP: 154/69 mmHg (10/17 0031) Pulse Rate: 78 (10/17 0031)  Labs:  Recent Labs  09/02/14 2018  WBC 8.3  HGB 14.1  PLT 195  CREATININE 0.92   Past Medical History  Diagnosis Date  . Irregular heart rate   . Macular degeneration   . Cognitive impairment   . Hypotension   . 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 Glaucoma   . Atrial fibrillation   . Dysrhythmia   . GERD (gastroesophageal reflux disease)   . Dementia    Assessment: 80 y/o M from assisted living facility with dyspnea, cannot r/o PNA on CXR, WBC wnl, renal function ok for age, afebrile, lactic acid mildly elevated.   Goal of Therapy:  Vancomycin trough level 15-20 mcg/ml  Plan:  -Vancomycin 1250 mg IV q24h -Cefepime per MD -Trend WBC, temp, renal function  -Drug levels as indicated   Narda Bonds 09/03/2014,12:49 AM

## 2014-09-04 LAB — PROTIME-INR
INR: 2.6 — ABNORMAL HIGH (ref 0.00–1.49)
PROTHROMBIN TIME: 28.1 s — AB (ref 11.6–15.2)

## 2014-09-04 LAB — URINE CULTURE

## 2014-09-04 LAB — LEGIONELLA ANTIGEN, URINE

## 2014-09-04 LAB — EXPECTORATED SPUTUM ASSESSMENT W GRAM STAIN, RFLX TO RESP C

## 2014-09-04 NOTE — Progress Notes (Signed)
[facility] PROGRESS NOTE  Howard Payne IRJ:[id number] DOB: 1920-06-29 DOA: 09/02/2014 PCP: Nyoka Cowden, MD  Assessment/Plan: 80 y/o male with PMH of A fib, HTN, CHF (probable diastolic) presented with SOB, DOE, fevers, chills, cough productive of yellow sputum -admitted with Pneumonia   1. Pneumonia; CXR: right lower lobe infiltrate -cont IV atx, bronchodilators, oxygen, pend blood cultures  2. Acute hypoxic respiratory failure on admission due to pneumonia  -cont as above; ABG 7.4-31-187-19; clinically improved  3. Acute on chronic CHF (diastolic); Pt is mild hypervolemic clinically;  -s/p 20 mg IV lasix; close monitor, I/O; daily weight; urine output  4. A fib on coumadin; cont BB, coumadin; (will d/w family Pt may not be a good candidate for coumadin due to fall) 5. Dementia, confused at baseline; at risk for delirium; close monitor   D/w patient, confirmed with her daughter  -Pt is DNR;   Code Status: DNR Family Communication: d/w patient, his daughter  (indicate person spoken with, relationship, and if by phone, the number) Disposition Plan: pend clinical improvement    Consultants:  none  Procedures:  none  Antibiotics:  Cefepime 10/16<<<  vanc 10/16<<<   (indicate start date, and stop date if known)  HPI/Subjective: Alert, confused at baseline   Objective: Filed Vitals:   09/04/14 0655  BP: 133/77  Pulse: 115  Temp: 97.7 F (36.5 C)  Resp: 16    Intake/Output Summary (Last 24 hours) at 09/04/14 1117 Last data filed at 09/03/14 2055  Gross per 24 hour  Intake      0 ml  Output    450 ml  Net   -450 ml   Filed Weights   09/03/14 0031  Weight: 83.371 kg (183 lb 12.8 oz)    Exam:   General:  Alert, confused at baseline   Cardiovascular: s1,s2 rrr  Respiratory: rales BL LL  Abdomen: soft, nt,nd   Musculoskeletal: mild pedal edema    Data Reviewed: Basic Metabolic Panel:  Recent Labs Lab 09/02/14 2018  09/03/14 0333  NA 135* 132*  K 4.3 4.7  CL 96 95*  CO2 20 19  GLUCOSE 150* 189*  BUN 17 15  CREATININE 0.92 0.77  CALCIUM 8.9 9.1   Liver Function Tests:  Recent Labs Lab 09/02/14 2018  AST 29  ALT 22  ALKPHOS 89  BILITOT 1.1  PROT 7.5  ALBUMIN 3.6   No results found for this basename: LIPASE, AMYLASE,  in the last 168 hours No results found for this basename: AMMONIA,  in the last 168 hours CBC:  Recent Labs Lab 09/02/14 2018 09/03/14 0333  WBC 8.3 6.6  NEUTROABS 4.7  --   HGB 14.1 13.8  HCT 40.6 40.7  MCV 94.9 94.9  PLT 195 205   Cardiac Enzymes:  Recent Labs Lab 09/02/14 2018  TROPONINI <0.30   BNP (last 3 results)  Recent Labs  04/23/14 1250 09/02/14 2018  PROBNP 841.6* 363.3   CBG: No results found for this basename: GLUCAP,  in the last 168 hours  Recent Results (from the past 240 hour(s))  URINE CULTURE     Status: None   Collection Time    09/02/14 10:03 PM      Result Value Ref Range Status   Specimen Description URINE, RANDOM   Final   Special Requests NONE   Final   Culture  Setup Time     Final   Value: 09/02/2014 22:47     Performed at [facility]  Colony Count     Final   Value: 50,000 COLONIES/ML     Performed at [facility]   Culture     Final   Value: Multiple bacterial morphotypes present, none predominant. Suggest appropriate recollection if clinically indicated.     Performed at [facility]   Report Status 09/04/2014 FINAL   Final  MRSA PCR SCREENING     Status: Abnormal   Collection Time    09/03/14  2:04 AM      Result Value Ref Range Status   MRSA by PCR POSITIVE (*) NEGATIVE Final   Comment:            The GeneXpert MRSA Assay (FDA     approved for NASAL specimens     only), is one component of a     comprehensive MRSA colonization     surveillance program. It is not     intended to diagnose MRSA     infection nor to guide or     monitor treatment for     MRSA infections.      RESULT CALLED TO, READ BACK BY AND VERIFIED WITH:     CALLED TO RN Earvin Hansen [phone] @0344  THANEY     Studies: Dg Chest 2 View  09/02/2014   CLINICAL DATA:  80 year old male with nonproductive cough for the past week. Initial encounter.  EXAM: CHEST  2 VIEW  COMPARISON:  04/24/2014 and 04/21/2014.  FINDINGS: Chronic increased lung markings most notable lung bases. This appears slightly different than on the prior exam and subtle superimposed right lower lobe infiltrate cannot be excluded in the proper clinical setting.  Cardiomegaly.  Central pulmonary vascular prominence.  No gross pneumothorax.  Calcified tortuous aorta  IMPRESSION: Chronic increased lung markings most notable lung bases. This appears slightly different than on the prior exam and subtle superimposed right lower lobe infiltrate cannot be excluded in the proper clinical setting.  Cardiomegaly.  Calcified tortuous aorta   Electronically Signed   By: Chauncey Cruel M.D.   On: 09/02/2014 21:08   Dg Chest Port 1 View  09/03/2014   CLINICAL DATA:  Shortness of breath.  EXAM: PORTABLE CHEST - 1 VIEW  COMPARISON:  09/02/2014 and 04/24/2014  FINDINGS: Patient is slightly rotated to the right. Lungs are adequately inflated with mild worsening bibasilar opacification right worse than left as cannot exclude infection. No definite effusion. Mild stable cardiomegaly. Calcified plaque over the thoracic aorta. Degenerative changes of the spine.  IMPRESSION: Slight worsening bibasilar opacification right worse than left as cannot exclude developing infection.  Stable cardiomegaly.   Electronically Signed   By: Marin Olp M.D.   On: 09/03/2014 07:45    Scheduled Meds: . acetaminophen  650 mg Oral 3 times per day  . albuterol  2.5 mg Nebulization QID  . ceFEPime (MAXIPIME) IV  1 g Intravenous BID  . Chlorhexidine Gluconate Cloth  6 each Topical Q0600  . dextromethorphan-guaiFENesin  1 tablet Oral BID  . digoxin  0.125 mg Oral Daily  .  latanoprost  1 drop Both Eyes QHS  . Memantine HCl ER  28 mg Oral Daily  . mupirocin ointment  1 application Nasal BID  . pantoprazole  40 mg Oral Daily  . polyvinyl alcohol  1 drop Left Eye QHS  . sertraline  25 mg Oral Daily  . vancomycin  1,500 mg Intravenous Q24H  . warfarin  5 mg Oral Once per day on Tue Sat  And  . warfarin  2.5 mg Oral Once per day on Sun Mon Wed Thu Fri  . Warfarin - Pharmacist Dosing Inpatient   Does not apply q1800   Continuous Infusions:   Active Problems:   Pneumonia    Time spent: >35 minutes     Kinnie Feil  [facility] Pager [phone]. If 7PM-7AM, please contact night-coverage at [email or web address], password [facility] 09/04/2014, 11:17 AM  LOS: 2 days

## 2014-09-04 NOTE — Progress Notes (Signed)
[place] for Warfarin Indication: atrial fibrillation  Labs:  Recent Labs  09/01/14 1653 09/02/14 2018 09/03/14 0333 09/04/14 0350  HGB  --  14.1 13.8  --   HCT  --  40.6 40.7  --   PLT  --  195 205  --   LABPROT  --   --  24.9* 28.1*  INR 2.2  --  2.23* 2.60*  CREATININE  --  0.92 0.77  --   TROPONINI  --  <0.30  --   --     Assessment: Warfarin PTA for afib, PTA dose is 5 mg on Tues/Sat and 2.5 mg all other days.  INR today is 2.23 which is therapeutic.  No bleeding reported.  Last dose 10/15, missed dose 10/16 due to being in ED. INR therapeutic today at 2.6.  No bleeding reported.   Goal of Therapy:  INR 2-3 Monitor platelets by anticoagulation protocol: Yes   Plan:  -continue home coumadin dose of 2.5 mg qday x 5 mg Tues/Sat --daily INR  Eudelia Bunch, Pharm.D. [phone] 09/04/2014 8:50 AM

## 2014-09-05 ENCOUNTER — Ambulatory Visit: Payer: Medicare Other | Admitting: Internal Medicine

## 2014-09-05 DIAGNOSIS — J189 Pneumonia, unspecified organism: Secondary | ICD-10-CM

## 2014-09-05 HISTORY — DX: Pneumonia, unspecified organism: J18.9

## 2014-09-05 LAB — BASIC METABOLIC PANEL
ANION GAP: 11 (ref 5–15)
BUN: 17 mg/dL (ref 6–23)
CHLORIDE: 97 meq/L (ref 96–112)
CO2: 26 mEq/L (ref 19–32)
CREATININE: 0.81 mg/dL (ref 0.50–1.35)
Calcium: 8.7 mg/dL (ref 8.4–10.5)
GFR calc non Af Amer: 75 mL/min — ABNORMAL LOW (ref >90)
GFR, EST AFRICAN AMERICAN: 87 mL/min — AB (ref >90)
Glucose, Bld: 87 mg/dL (ref 70–99)
Potassium: 4.2 mEq/L (ref 3.7–5.3)
Sodium: 134 mEq/L — ABNORMAL LOW (ref 137–147)

## 2014-09-05 LAB — PROTIME-INR
INR: 3 — AB (ref 0.00–1.49)
Prothrombin Time: 31.4 seconds — ABNORMAL HIGH (ref 11.6–15.2)

## 2014-09-05 LAB — CBC
HCT: 44.6 % (ref 39.0–52.0)
Hemoglobin: 15.1 g/dL (ref 13.0–17.0)
MCH: 32.8 pg (ref 26.0–34.0)
MCHC: 33.9 g/dL (ref 30.0–36.0)
MCV: 97 fL (ref 78.0–100.0)
Platelets: 205 10*3/uL (ref 150–400)
RBC: 4.6 MIL/uL (ref 4.22–5.81)
RDW: 13.2 % (ref 11.5–15.5)
WBC: 9 10*3/uL (ref 4.0–10.5)

## 2014-09-05 MED ORDER — RESOURCE THICKENUP CLEAR PO POWD
ORAL | Status: DC | PRN
  Filled 2014-09-05: qty 125

## 2014-09-05 MED ORDER — STARCH (THICKENING) PO POWD: ORAL | Status: DC | PRN

## 2014-09-05 NOTE — Progress Notes (Signed)
[facility] PROGRESS NOTE  Howard Payne IRS:[id number] DOB: [birth year] DOA: 09/02/2014 PCP: Nyoka Cowden, MD  Assessment/Plan: 80 y/o male with PMH of A fib, HTN, CHF (probable diastolic) presented with SOB, DOE, fevers, chills, cough productive of yellow sputum -admitted with Pneumonia   1. Pneumonia; CXR: right lower lobe infiltrate -improving on IV atx, bronchodilators, oxygen, blood cultures: NGTD 2. Acute hypoxic respiratory failure on admission due to pneumonia  -cont as above; ABG 7.4-31-187-19; clinically improved  3. Acute on chronic CHF (diastolic); Pt is mild hypervolemic clinically;  -s/p 20 mg IV lasix; close monitor, I/O; daily weight; urine output  4. A fib on coumadin; cont BB, coumadin; (will d/w family Pt may not be a good candidate for coumadin due to fall) 5. Dementia, confused at baseline; at risk for delirium; close monitor   D/w patient, confirmed with her daughter  -Pt is DNR;   Code Status: DNR Family Communication: d/w patient, his daughter  (indicate person spoken with, relationship, and if by phone, the number) Disposition Plan: pend clinical improvement, obtain PT     Consultants:  none  Procedures:  none  Antibiotics:  Cefepime 10/16<<<  vanc 10/16<<<   (indicate start date, and stop date if known)  HPI/Subjective: Alert, confused at baseline   Objective: Filed Vitals:   09/05/14 0935  BP:   Pulse: 95  Temp:   Resp:     Intake/Output Summary (Last 24 hours) at 09/05/14 1034 Last data filed at 09/05/14 0932  Gross per 24 hour  Intake    820 ml  Output   1200 ml  Net   -380 ml   Filed Weights   09/03/14 0031  Weight: 83.371 kg (183 lb 12.8 oz)    Exam:   General:  Alert, confused at baseline   Cardiovascular: s1,s2 rrr  Respiratory: rales BL LL  Abdomen: soft, nt,nd   Musculoskeletal: mild pedal edema    Data Reviewed: Basic Metabolic Panel:  Recent Labs Lab 09/02/14 2018 09/03/14 0333  09/05/14 0724  NA 135* 132* 134*  K 4.3 4.7 4.2  CL 96 95* 97  CO2 20 19 26   GLUCOSE 150* 189* 87  BUN 17 15 17   CREATININE 0.92 0.77 0.81  CALCIUM 8.9 9.1 8.7   Liver Function Tests:  Recent Labs Lab 09/02/14 2018  AST 29  ALT 22  ALKPHOS 89  BILITOT 1.1  PROT 7.5  ALBUMIN 3.6   No results found for this basename: LIPASE, AMYLASE,  in the last 168 hours No results found for this basename: AMMONIA,  in the last 168 hours CBC:  Recent Labs Lab 09/02/14 2018 09/03/14 0333 09/05/14 0724  WBC 8.3 6.6 9.0  NEUTROABS 4.7  --   --   HGB 14.1 13.8 15.1  HCT 40.6 40.7 44.6  MCV 94.9 94.9 97.0  PLT 195 205 205   Cardiac Enzymes:  Recent Labs Lab 09/02/14 2018  TROPONINI <0.30   BNP (last 3 results)  Recent Labs  04/23/14 1250 09/02/14 2018  PROBNP 841.6* 363.3   CBG: No results found for this basename: GLUCAP,  in the last 168 hours  Recent Results (from the past 240 hour(s))  CULTURE, BLOOD (ROUTINE X 2)     Status: None   Collection Time    09/02/14  9:31 PM      Result Value Ref Range Status   Specimen Description BLOOD ARM RIGHT   Final   Special Requests BOTTLES DRAWN AEROBIC AND ANAEROBIC 5CC   Final  Culture  Setup Time     Final   Value: 09/03/2014 03:29     Performed at [facility]   Culture     Final   Value:        BLOOD CULTURE RECEIVED NO GROWTH TO DATE CULTURE WILL BE HELD FOR 5 DAYS BEFORE ISSUING A FINAL NEGATIVE REPORT     Performed at [facility]   Report Status PENDING   Incomplete  CULTURE, BLOOD (ROUTINE X 2)     Status: None   Collection Time    09/02/14  9:38 PM      Result Value Ref Range Status   Specimen Description BLOOD HAND RIGHT   Final   Special Requests BOTTLES DRAWN AEROBIC AND ANAEROBIC 5CC   Final   Culture  Setup Time     Final   Value: 09/03/2014 03:31     Performed at [facility]   Culture     Final   Value:        BLOOD CULTURE RECEIVED NO GROWTH TO DATE CULTURE WILL BE HELD FOR 5  DAYS BEFORE ISSUING A FINAL NEGATIVE REPORT     Performed at [facility]   Report Status PENDING   Incomplete  URINE CULTURE     Status: None   Collection Time    09/02/14 10:03 PM      Result Value Ref Range Status   Specimen Description URINE, RANDOM   Final   Special Requests NONE   Final   Culture  Setup Time     Final   Value: 09/02/2014 22:47     Performed at [facility]     Final   Value: 50,000 COLONIES/ML     Performed at [facility]   Culture     Final   Value: Multiple bacterial morphotypes present, none predominant. Suggest appropriate recollection if clinically indicated.     Performed at [facility]   Report Status 09/04/2014 FINAL   Final  MRSA PCR SCREENING     Status: Abnormal   Collection Time    09/03/14  2:04 AM      Result Value Ref Range Status   MRSA by PCR POSITIVE (*) NEGATIVE Final   Comment:            The GeneXpert MRSA Assay (FDA     approved for NASAL specimens     only), is one component of a     comprehensive MRSA colonization     surveillance program. It is not     intended to diagnose MRSA     infection nor to guide or     monitor treatment for     MRSA infections.     RESULT CALLED TO, READ BACK BY AND VERIFIED WITH:     CALLED TO RN Earvin Hansen [phone] @0344  THANEY  CULTURE, EXPECTORATED SPUTUM-ASSESSMENT     Status: None   Collection Time    09/04/14  5:55 PM      Result Value Ref Range Status   Specimen Description SPUTUM   Final   Special Requests NONE   Final   Sputum evaluation     Final   Value: THIS SPECIMEN IS ACCEPTABLE. RESPIRATORY CULTURE REPORT TO FOLLOW.   Report Status 09/04/2014 FINAL   Final     Studies: No results found.  Scheduled Meds: . acetaminophen  650 mg Oral 3 times per day  . albuterol  2.5 mg Nebulization QID  . ceFEPime (MAXIPIME) IV  1 g Intravenous BID  . Chlorhexidine Gluconate Cloth  6 each Topical Q0600  . dextromethorphan-guaiFENesin  1  tablet Oral BID  . digoxin  0.125 mg Oral Daily  . latanoprost  1 drop Both Eyes QHS  . Memantine HCl ER  28 mg Oral Daily  . mupirocin ointment  1 application Nasal BID  . pantoprazole  40 mg Oral Daily  . polyvinyl alcohol  1 drop Left Eye QHS  . sertraline  25 mg Oral Daily  . vancomycin  1,500 mg Intravenous Q24H  . Warfarin - Pharmacist Dosing Inpatient   Does not apply q1800   Continuous Infusions:   Active Problems:   Pneumonia    Time spent: >35 minutes     Kinnie Feil  [facility] Pager [phone]. If 7PM-7AM, please contact night-coverage at [email or web address], password [facility] 09/05/2014, 10:34 AM  LOS: 3 days

## 2014-09-05 NOTE — Progress Notes (Signed)
ANTICOAGULATION CONSULT NOTE - Follow Up Consult  Pharmacy Consult for warfarin Indication: atrial fibrillation  No Known Allergies  Patient Measurements: Height: 6' (182.9 cm) Weight: 183 lb 12.8 oz (83.371 kg) IBW/kg (Calculated) : 77.6   Vital Signs: Temp: 97.5 F (36.4 C) (10/19 0659) BP: 139/90 mmHg (10/19 0659) Pulse Rate: 95 (10/19 0935)  Labs:  Recent Labs  09/02/14 2018 09/03/14 0333 09/04/14 0350 09/05/14 0724  HGB 14.1 13.8  --  15.1  HCT 40.6 40.7  --  44.6  PLT 195 205  --  205  LABPROT  --  24.9* 28.1* 31.4*  INR  --  2.23* 2.60* 3.00*  CREATININE 0.92 0.77  --  0.81  TROPONINI <0.30  --   --   --     Estimated Creatinine Clearance: 63.9 ml/min (by C-G formula based on Cr of 0.81).   Assessment: 61 YOM with dementia admitted with PNA. Home dose of warfarin 2.5mg  daily except 5mg  on Tuesdays and Saturdays. Last dose PTA was 10/15, dose was missed on 10/16 while he was in the ED here. He received his normal scheduled doses on Saturday (5mg ) and Sunday (2.5mg ). INR this morning rose to top of therapeutic range at 3. CBC is WNL and no bleeding noted.  Goal of Therapy:  INR 2-3 Monitor platelets by anticoagulation protocol: Yes   Plan:  1. Hold warfarin tonight as I anticipate his INR will increase further  2. Daily PT/INR 3. Follow closely for s/s bleeding  Michal Strzelecki D. Albertus Chiarelli, PharmD, BCPS Clinical Pharmacist Pager: [phone] 09/05/2014 10:36 AM

## 2014-09-05 NOTE — Evaluation (Signed)
Clinical/Bedside Swallow Evaluation Patient Details  Name: Howard Payne MRN: [id number] Date of Birth: [birth year]  Today's Date: 09/05/2014 Time: 5638-7564 SLP Time Calculation (min): 34 min  Past Medical History:  Past Medical History  Diagnosis Date  . Irregular heart rate   . Macular degeneration   . Cognitive impairment   . Hypotension   . 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 Glaucoma   . Atrial fibrillation   . Dysrhythmia   . GERD (gastroesophageal reflux disease)   . Dementia    Past Surgical History:  Past Surgical History  Procedure Laterality Date  . Mohs surgery  2006, 07/06/12    Left Lower Eyelid: Invasive Squamous Cell Carcinoma  . Shave biopsy  05/14/12    Left Lower Forehead  . Skin punch biopsy  05/14/12    Mid Left Lower Eyelid  . Eye surgery     HPI:  80 y/o male with PMH of dementia, GERD, A fib, HTN, CHF (probable diastolic) presented with SOB, DOE, fevers, chills, cough productive of yellow sputum, admitted for PNA with concern for RLL infiltrate on CXR.   Assessment / Plan / Recommendation Clinical Impression  Pt presents with suspected mild delay in swallow initiation however with adequate hyolaryngeal movement upon palpation. Pt has several consecutive swallows with thin liquids, followed by consistent bouts of eructation that lead to coughing, concerning for esophageal component (?retrograde flow into the pharynx/larynx after the swallow). Pt has occasional second swallows and eructation with nectar thick liquids however with no coughing elicited with Min cues provided by SLP for small cup sips. Recommend to initiate Dys 3 textures and nectar thick liquids with aspiration and esophageal precautions. Would recommend that MD consider esophageal assessment.    Aspiration Risk  Moderate    Diet Recommendation Dysphagia 3 (Mechanical  Soft);Nectar-thick liquid   Liquid Administration via: Cup;No straw Medication Administration: Whole meds with puree Supervision: Patient able to self feed;Full supervision/cueing for compensatory strategies Compensations: Slow rate;Small sips/bites;Follow solids with liquid Postural Changes and/or Swallow Maneuvers: Seated upright 90 degrees;Upright 30-60 min after meal    Other  Recommendations Recommended Consults: MBS;Consider esophageal assessment Oral Care Recommendations: Oral care BID Other Recommendations: Order thickener from pharmacy;Prohibited food (jello, ice cream, thin soups);Remove water pitcher   Follow Up Recommendations  Skilled Nursing facility    Frequency and Duration min 2x/week  2 weeks   Pertinent Vitals/Pain n/a    SLP Swallow Goals     Swallow Study Prior Functional Status       General Date of Onset: 09/03/14 HPI: 80 y/o male with PMH of dementia, GERD, A fib, HTN, CHF (probable diastolic) presented with SOB, DOE, fevers, chills, cough productive of yellow sputum, admitted for PNA with concern for RLL infiltrate on CXR. Type of Study: Bedside swallow evaluation Previous Swallow Assessment: none in chart Diet Prior to this Study: Regular;Thin liquids Temperature Spikes Noted: No Respiratory Status: Room air History of Recent Intubation: No Behavior/Cognition: Alert;Cooperative;Pleasant mood;Confused Oral Cavity - Dentition: Adequate natural dentition Self-Feeding Abilities: Able to feed self Patient Positioning: Upright in bed Baseline Vocal Quality: Clear Volitional Cough: Strong Volitional Swallow: Unable to elicit ("mouth is too dry")    Oral/Motor/Sensory Function Overall Oral Motor/Sensory Function: Appears within functional limits for tasks assessed   Ice Chips Ice chips: Within functional limits   Thin Liquid Thin Liquid: Impaired Presentation: Cup;Self Fed Pharyngeal  Phase Impairments: Suspected delayed Swallow;Multiple  swallows;Cough - Delayed;Other (comments) (delayed cough occurred after belching)    Nectar Thick Nectar Thick Liquid: Impaired Presentation: Cup;Self Fed Pharyngeal Phase Impairments: Suspected delayed Swallow;Multiple swallows   Honey Thick Honey Thick Liquid: Not tested   Puree Puree: Impaired Presentation: Self Fed;Spoon Pharyngeal Phase Impairments: Suspected delayed Swallow   Solid   GO    Solid: Impaired Presentation: Self Fed Oral Phase Impairments: Impaired mastication        Germain Osgood, M.A. CCC-SLP [phone]  Germain Osgood 09/05/2014,5:02 PM

## 2014-09-05 NOTE — Progress Notes (Signed)
SLP Cancellation Note  Patient Details Name: Ajmal Kathan MRN: [id number] DOB: [birth year]   Cancelled treatment:       Reason Eval/Treat Not Completed: Other (comment)  RT arrived to complete breathing treatment due to increased congestion. SLP to return as able to complete bedside swallow evaluation.    Germain Osgood, M.A. CCC-SLP [phone]  Germain Osgood 09/05/2014, 3:15 PM

## 2014-09-06 ENCOUNTER — Encounter (HOSPITAL_COMMUNITY): Payer: Self-pay | Admitting: General Practice

## 2014-09-06 DIAGNOSIS — R4189 Other symptoms and signs involving cognitive functions and awareness: Secondary | ICD-10-CM

## 2014-09-06 LAB — PROTIME-INR
INR: 2.82 — AB (ref 0.00–1.49)
Prothrombin Time: 29.9 seconds — ABNORMAL HIGH (ref 11.6–15.2)

## 2014-09-06 MED ORDER — TRAZODONE HCL 50 MG PO TABS
25.0000 mg | ORAL_TABLET | Freq: Every evening | ORAL | Status: DC | PRN
  Administered 2014-09-06: 25 mg via ORAL
  Filled 2014-09-06: qty 1

## 2014-09-06 MED ORDER — WARFARIN SODIUM 2.5 MG PO TABS
2.5000 mg | ORAL_TABLET | Freq: Once | ORAL | Status: AC
  Administered 2014-09-06: 2.5 mg via ORAL
  Filled 2014-09-06: qty 1

## 2014-09-06 MED ORDER — ALBUTEROL SULFATE (2.5 MG/3ML) 0.083% IN NEBU
2.5000 mg | INHALATION_SOLUTION | Freq: Three times a day (TID) | RESPIRATORY_TRACT | Status: DC
  Filled 2014-09-06: qty 3

## 2014-09-06 NOTE — Telephone Encounter (Signed)
Called Howard Payne back and spoke to her regarding pt, she had concerns about pt's care at [facility] and wanted to know if the pt always has difficulty breathing and very SOB, she said the staff was saying this is how he is all the time and she was the one who told the staff they need to do something.Told her pt is not like that all the time and that is why I sent order over for breathing treatments to be every 6 hours instead of PRN because the daughter called to have them changed because they do not give them to him. Told her pt was admitted to the hospital Friday evening. She said she was aware and that the pt was also put on Thickened Liquid diet. Told her pt's daughter is very up on his care and I know she complains at times that they do not do what needs to be done. [name] verbalized understanding and thanked me for speaking with her.

## 2014-09-06 NOTE — Clinical Social Work Note (Signed)
CSW spoke with patients nurse at [facility].  Patients nurse stated that patient is ok to return to ALF- CSW informed nurse that PT has evaluated and stated that patient needs minimal assistance in ADLs and nurse stated that patient has needed minimal assistance at baseline and is ok to return to ALF.  CSW informed that patient will likely DC tomorrow.  CSW will continue to follow.  Domenica Reamer, [place] Social Worker [phone]

## 2014-09-06 NOTE — Progress Notes (Signed)
Patient had not been sleeping well for the past 2 nights now which was confirmed by daughter.  Patient may benefit for any sleeping medication.

## 2014-09-06 NOTE — Clinical Social Work Psychosocial (Signed)
Clinical Social Work Department BRIEF PSYCHOSOCIAL ASSESSMENT 09/06/2014  Patient:  Howard Payne,Howard Payne     Account Number:  [id number]     Admit date:  09/02/2014  Clinical Social Worker:  Domenica Reamer, [place]  Date/Time:  09/06/2014 03:18 PM  Referred by:  Physician  Date Referred:  09/05/2014 Referred for  ALF Placement   Other Referral:   Interview type:  Family Other interview type:    PSYCHOSOCIAL DATA Living Status:  FACILITY Admitted from facility:  [facility] [place] Level of care:  Assisted Living Primary support name:  Howard Payne Primary support relationship to patient:  CHILD, ADULT Degree of support available:   CSW spoke with patients son who reported high level of support    CURRENT CONCERNS Current Concerns  Post-Acute Placement   Other Concerns:    SOCIAL WORK ASSESSMENT / PLAN CSW spoke with patients son, Howard Payne, about patient return to [facility] [place]- per patients son patient has been residing there for several years.  Patients son is agreeable to return to ALF and is glad that doctor is thinking [facility] is still appropriate for ALF.  CSW will continue to follow.   Assessment/plan status:  Psychosocial Support/Ongoing Assessment of Needs Other assessment/ plan:   FL2 update   Information/referral to community resources:   [facility]    PATIENT'S/FAMILY'S RESPONSE TO PLAN OF CARE: Patients family is agreeable to plan and is only concerned about moving patient to as few new environments as possible due to mental status.       Domenica Reamer, [place] Social Worker [phone]

## 2014-09-06 NOTE — Progress Notes (Signed)
[facility] PROGRESS NOTE  Howard Payne ZOX:[id number] DOB: 09-16-1920 DOA: 09/02/2014 PCP: Nyoka Cowden, MD  Assessment/Plan: 80 y/o male with PMH of A fib, HTN, CHF (probable diastolic) presented with SOB, DOE, fevers, chills, cough productive of yellow sputum -admitted with Pneumonia   1. Pneumonia; CXR: right lower lobe infiltrate -slowly improving on IV atx, bronchodilators, oxygen, blood cultures: NGTD 2. Acute hypoxic respiratory failure on admission due to pneumonia  -cont as above; ABG 7.4-31-187-19; clinically improved  3. Acute on chronic CHF (diastolic); echo (06/1190): LVEF 60-65%, probable diastolic dysfunction;  -Pt is mild hypervolemic clinically; Pt is not on diuretics at home;  -s/p 20 mg IV lasix; I/O; daily weight; urine output; diurese prn  4. A fib on coumadin; cont BB, coumadin; (will d/w family Pt may not be a good candidate for coumadin due to fall) 5. Dementia, confused at baseline; at risk for delirium; close monitor   D/w patient, confirmed with her daughter  -Pt is DNR;   Code Status: DNR Family Communication: d/w patient, his daughter  (indicate person spoken with, relationship, and if by phone, the number) Disposition Plan: pend clinical improvement, obtain PT; ? SNF; patient lives at [place]   Consultants:  none  Procedures:  none  Antibiotics:  Cefepime 10/16<<<  vanc 10/16<<<   (indicate start date, and stop date if known)  HPI/Subjective: Alert, confused at baseline   Objective: Filed Vitals:   09/06/14 0610  BP: 144/76  Pulse: 70  Temp: 97.5 F (36.4 C)  Resp: 17    Intake/Output Summary (Last 24 hours) at 09/06/14 1139 Last data filed at 09/05/14 2142  Gross per 24 hour  Intake    550 ml  Output     50 ml  Net    500 ml   Filed Weights   09/03/14 0031  Weight: 83.371 kg (183 lb 12.8 oz)    Exam:   General:  Alert, confused at baseline   Cardiovascular: s1,s2 rrr  Respiratory: rales BL  LL  Abdomen: soft, nt,nd   Musculoskeletal: mild pedal edema    Data Reviewed: Basic Metabolic Panel:  Recent Labs Lab 09/02/14 2018 09/03/14 0333 09/05/14 0724  NA 135* 132* 134*  K 4.3 4.7 4.2  CL 96 95* 97  CO2 20 19 26   GLUCOSE 150* 189* 87  BUN 17 15 17   CREATININE 0.92 0.77 0.81  CALCIUM 8.9 9.1 8.7   Liver Function Tests:  Recent Labs Lab 09/02/14 2018  AST 29  ALT 22  ALKPHOS 89  BILITOT 1.1  PROT 7.5  ALBUMIN 3.6   No results found for this basename: LIPASE, AMYLASE,  in the last 168 hours No results found for this basename: AMMONIA,  in the last 168 hours CBC:  Recent Labs Lab 09/02/14 2018 09/03/14 0333 09/05/14 0724  WBC 8.3 6.6 9.0  NEUTROABS 4.7  --   --   HGB 14.1 13.8 15.1  HCT 40.6 40.7 44.6  MCV 94.9 94.9 97.0  PLT 195 205 205   Cardiac Enzymes:  Recent Labs Lab 09/02/14 2018  TROPONINI <0.30   BNP (last 3 results)  Recent Labs  04/23/14 1250 09/02/14 2018  PROBNP 841.6* 363.3   CBG: No results found for this basename: GLUCAP,  in the last 168 hours  Recent Results (from the past 240 hour(s))  CULTURE, BLOOD (ROUTINE X 2)     Status: None   Collection Time    09/02/14  9:31 PM      Result Value  Ref Range Status   Specimen Description BLOOD ARM RIGHT   Final   Special Requests BOTTLES DRAWN AEROBIC AND ANAEROBIC 5CC   Final   Culture  Setup Time     Final   Value: 09/03/2014 03:29     Performed at [facility]   Culture     Final   Value:        BLOOD CULTURE RECEIVED NO GROWTH TO DATE CULTURE WILL BE HELD FOR 5 DAYS BEFORE ISSUING A FINAL NEGATIVE REPORT     Performed at [facility]   Report Status PENDING   Incomplete  CULTURE, BLOOD (ROUTINE X 2)     Status: None   Collection Time    09/02/14  9:38 PM      Result Value Ref Range Status   Specimen Description BLOOD HAND RIGHT   Final   Special Requests BOTTLES DRAWN AEROBIC AND ANAEROBIC 5CC   Final   Culture  Setup Time     Final   Value:  09/03/2014 03:31     Performed at [facility]   Culture     Final   Value:        BLOOD CULTURE RECEIVED NO GROWTH TO DATE CULTURE WILL BE HELD FOR 5 DAYS BEFORE ISSUING A FINAL NEGATIVE REPORT     Performed at [facility]   Report Status PENDING   Incomplete  URINE CULTURE     Status: None   Collection Time    09/02/14 10:03 PM      Result Value Ref Range Status   Specimen Description URINE, RANDOM   Final   Special Requests NONE   Final   Culture  Setup Time     Final   Value: 09/02/2014 22:47     Performed at [facility]     Final   Value: 50,000 COLONIES/ML     Performed at [facility]   Culture     Final   Value: Multiple bacterial morphotypes present, none predominant. Suggest appropriate recollection if clinically indicated.     Performed at [facility]   Report Status 09/04/2014 FINAL   Final  MRSA PCR SCREENING     Status: Abnormal   Collection Time    09/03/14  2:04 AM      Result Value Ref Range Status   MRSA by PCR POSITIVE (*) NEGATIVE Final   Comment:            The GeneXpert MRSA Assay (FDA     approved for NASAL specimens     only), is one component of a     comprehensive MRSA colonization     surveillance program. It is not     intended to diagnose MRSA     infection nor to guide or     monitor treatment for     MRSA infections.     RESULT CALLED TO, READ BACK BY AND VERIFIED WITH:     CALLED TO RN Earvin Hansen [phone] @[id number]  THANEY  CULTURE, EXPECTORATED SPUTUM-ASSESSMENT     Status: None   Collection Time    09/04/14  5:55 PM      Result Value Ref Range Status   Specimen Description SPUTUM   Final   Special Requests NONE   Final   Sputum evaluation     Final   Value: THIS SPECIMEN IS ACCEPTABLE. RESPIRATORY CULTURE REPORT TO FOLLOW.   Report Status 09/04/2014  FINAL   Final  CULTURE, RESPIRATORY (NON-EXPECTORATED)     Status: None   Collection Time    09/04/14  5:55 PM      Result  Value Ref Range Status   Specimen Description SPUTUM   Final   Special Requests NONE   Final   Gram Stain     Final   Value: NO WBC SEEN     NO SQUAMOUS EPITHELIAL CELLS SEEN     NO ORGANISMS SEEN     Performed at [facility]   Culture     Final   Value: NORMAL OROPHARYNGEAL FLORA     Performed at [facility]   Report Status PENDING   Incomplete     Studies: No results found.  Scheduled Meds: . acetaminophen  650 mg Oral 3 times per day  . albuterol  2.5 mg Nebulization QID  . ceFEPime (MAXIPIME) IV  1 g Intravenous BID  . Chlorhexidine Gluconate Cloth  6 each Topical Q0600  . dextromethorphan-guaiFENesin  1 tablet Oral BID  . digoxin  0.125 mg Oral Daily  . latanoprost  1 drop Both Eyes QHS  . Memantine HCl ER  28 mg Oral Daily  . mupirocin ointment  1 application Nasal BID  . pantoprazole  40 mg Oral Daily  . polyvinyl alcohol  1 drop Left Eye QHS  . sertraline  25 mg Oral Daily  . vancomycin  1,500 mg Intravenous Q24H  . warfarin  2.5 mg Oral ONCE-1800  . Warfarin - Pharmacist Dosing Inpatient   Does not apply q1800   Continuous Infusions:   Active Problems:   Pneumonia    Time spent: >35 minutes     Kinnie Feil  [facility] Pager [phone]. If 7PM-7AM, please contact night-coverage at [email or web address], password [facility] 09/06/2014, 11:39 AM  LOS: 4 days

## 2014-09-06 NOTE — Telephone Encounter (Signed)
Kennith Gain from [facility] requesting call back from Dr. Marthann Schiller nurse.  Callback # [phone]

## 2014-09-06 NOTE — Evaluation (Signed)
Physical Therapy Evaluation Patient Details Name: Howard Payne MRN: [id number] DOB: [birth year] Today's Date: 09/06/2014   History of Present Illness  Pt is 80 yo male, with h/o skin cancer, a fib, HTN, dementia, CHF who is resident of ALF, who presented to [facility] ED with main concern of several days duration of progressively worsening dyspnea.  Dx of PNA.   Clinical Impression  *Pt admitted with PNA**. Pt currently with functional limitations due to the deficits listed below (see PT Problem List).  Pt will benefit from skilled PT to increase their independence and safety with mobility to allow discharge to the venue listed below.   Min assist to transfer from bed to recliner with RW. Pt is lethargic, due to sleeping poorly last 2 nights per RN. Pt would benefit from post acute PT at ALF.    **    Follow Up Recommendations SNF    Equipment Recommendations  None recommended by PT    Recommendations for Other Services       Precautions / Restrictions Precautions Precautions: Fall Restrictions Weight Bearing Restrictions: No      Mobility  Bed Mobility Overal bed mobility: Needs Assistance Bed Mobility: Rolling;Sidelying to Sit Rolling: Min assist Sidelying to sit: Mod assist       General bed mobility comments: assist to intiate movement and to raise trunk  Transfers Overall transfer level: Needs assistance Equipment used: Rolling walker (2 wheeled) Transfers: Sit to/from Stand Sit to Stand: Min assist;From elevated surface         General transfer comment: assist to rise  Ambulation/Gait Ambulation/Gait assistance: Min assist Ambulation Distance (Feet): 2 Feet Assistive device: Rolling walker (2 wheeled) Gait Pattern/deviations: Step-to pattern;Trunk flexed   Gait velocity interpretation: Below normal speed for age/gender General Gait Details: assist to manage RW and for safety  Stairs            Wheelchair Mobility    Modified Rankin (Stroke  Patients Only)       Balance Overall balance assessment: Needs assistance   Sitting balance-Leahy Scale: Good     Standing balance support: Bilateral upper extremity supported Standing balance-Leahy Scale: Poor                               Pertinent Vitals/Pain Pain Assessment: No/denies pain    Home Living Family/patient expects to be discharged to:: Assisted living                 Additional Comments: Pt states that he does not use an AD for walking and is independent with ADLs at ALF. Per chart he sometimes used a WC due to weakness.     Prior Function Level of Independence: Needs assistance   Gait / Transfers Assistance Needed: RW, w/c per pt's daughter  ADL's / Homemaking Assistance Needed: Pt's daughter states that ALF staff assist pt with min A for bathing and dressing        Hand Dominance   Dominant Hand: Left    Extremity/Trunk Assessment   Upper Extremity Assessment: Overall WFL for tasks assessed           Lower Extremity Assessment: Overall WFL for tasks assessed (4/5 BLEs, sensation intact to light touch)      Cervical / Trunk Assessment: Kyphotic  Communication   Communication: HOH  Cognition Arousal/Alertness: Lethargic (per RN pt hasn't slept much in past 2 nights) Behavior During Therapy: [facility] for tasks assessed/performed Overall Cognitive  Status: No family/caregiver present to determine baseline cognitive functioning (noted h/o mild dementia, pt able to follow commands and is oriented to self and situation)                      General Comments      Exercises        Assessment/Plan    PT Assessment Patient needs continued PT services  PT Diagnosis Generalized weakness   PT Problem List Decreased activity tolerance;Decreased mobility;Decreased balance  PT Treatment Interventions Gait training;Functional mobility training;Therapeutic activities;Patient/family education;Balance training   PT Goals  (Current goals can be found in the Care Plan section) Acute Rehab PT Goals Patient Stated Goal: none stated PT Goal Formulation: With patient Time For Goal Achievement: 09/20/14 Potential to Achieve Goals: Good    Frequency Min 3X/week   Barriers to discharge        Co-evaluation               End of Session Equipment Utilized During Treatment: Gait belt Activity Tolerance: Patient limited by fatigue Patient left: in chair;with call bell/phone within reach;with chair alarm set Nurse Communication: Mobility status         Time: 1206-1223 PT Time Calculation (min): 17 min   Charges:   PT Evaluation $Initial PT Evaluation Tier I: 1 Procedure PT Treatments $Therapeutic Activity: 8-22 mins   PT G Codes:          Philomena Doheny 09/06/2014, 12:54 PM [phone]

## 2014-09-06 NOTE — Progress Notes (Signed)
ANTICOAGULATION and ANTIBIOTIC CONSULT NOTE - Follow Up Consult  Pharmacy Consult for warfarin; vancomycin and cefepime Indication: atrial fibrillation; PNA  No Known Allergies  Patient Measurements: Height: 6' (182.9 cm) Weight: 183 lb 12.8 oz (83.371 kg) IBW/kg (Calculated) : 77.6   Vital Signs: Temp: 97.5 F (36.4 C) (10/20 0610) Temp Source: Oral (10/20 0610) BP: 144/76 mmHg (10/20 0610) Pulse Rate: 70 (10/20 0610)  Labs:  Recent Labs  09/04/14 0350 09/05/14 0724 09/06/14 0500  HGB  --  15.1  --   HCT  --  44.6  --   PLT  --  205  --   LABPROT 28.1* 31.4* 29.9*  INR 2.60* 3.00* 2.82*  CREATININE  --  0.81  --     Estimated Creatinine Clearance: 63.9 ml/min (by C-G formula based on Cr of 0.81).   Assessment: 69 YOM with dementia admitted with PNA. Home dose of warfarin 2.5mg  daily except 5mg  on Tuesdays and Saturdays. Last dose PTA was 10/15, dose was missed on 10/16 while he was in the ED here. He received his normal scheduled doses on Saturday 10/17 (5mg ) and Sunday 10/18 (2.5mg )> INR yesterday morning rose to top of therapeutic range at 3. Dose was held last evening and INR this morning down to 2.82. CBC is WNL and no bleeding noted.  CXR on 10/17 shows RLL infiltrate and patient started on HCAP treatment with vancomycin and cefepime- today is D#4 of therapy. SCr remains stable. Per MD, improving clinically. Sputum culture shows normal flora. Blood cultures NGTD.  Goal of Therapy:  INR 2-3 Monitor platelets by anticoagulation protocol: Yes Vancomycin trough: 15-44mcg/mL  Plan:  1. Warfarin 2.5mg  po x1 tonight  2. Daily PT/INR 3. Follow closely for s/s bleeding 4. Continue cefepime 1g IV q12h 5. Continue vancomycin 1500mg  IV q24h 6. Follow clinical progression, renal function, trough PRN   Kynzlie Hilleary D. Chena Chohan, PharmD, BCPS Clinical Pharmacist Pager: [phone] 09/06/2014 11:08 AM

## 2014-09-06 NOTE — Care Management Note (Signed)
  Page 1 of 1   09/07/2014     1:30:27 PM CARE MANAGEMENT NOTE 09/07/2014  Patient:  Howard Payne,Howard Payne   Account Number:  [id number]  Date Initiated:  09/06/2014  Documentation initiated by:  Magdalen Spatz  Subjective/Objective Assessment:     Action/Plan:   Anticipated DC Date:  09/07/2014   Anticipated DC Plan:  [place]         Choice offered to / List presented to:  C-4 Adult Children        HH arranged  HH-1 RN  [place]       [place]   Status of service:  Completed, signed off Medicare Important Message given?  YES (If response is "NO", the following Medicare IM given date fields will be blank) Date Medicare IM given:  09/06/2014 Medicare IM given by:  Magdalen Spatz Date Additional Medicare IM given:   Additional Medicare IM given by:    Discharge Disposition:    Per UR Regulation:    If discussed at Long Length of Stay Meetings, dates discussed:    Comments:  09-07-14 Spoke with patient's daughter Dorian Pod [phone] , family has arranged for someone to be with patient all times . Dorian Pod would like her father to return to [facility] with [facility] . SW following . Magdalen Spatz RN BSN [phone]

## 2014-09-07 ENCOUNTER — Telehealth: Payer: Self-pay | Admitting: Internal Medicine

## 2014-09-07 DIAGNOSIS — R0602 Shortness of breath: Secondary | ICD-10-CM

## 2014-09-07 LAB — PROTIME-INR
INR: 2.44 — AB (ref 0.00–1.49)
PROTHROMBIN TIME: 26.7 s — AB (ref 11.6–15.2)

## 2014-09-07 LAB — CULTURE, RESPIRATORY W GRAM STAIN
Culture: NORMAL
Gram Stain: NONE SEEN

## 2014-09-07 LAB — CULTURE, RESPIRATORY

## 2014-09-07 MED ORDER — FUROSEMIDE 40 MG PO TABS
40.0000 mg | ORAL_TABLET | Freq: Once | ORAL | Status: AC
  Administered 2014-09-07: 40 mg via ORAL
  Filled 2014-09-07: qty 1

## 2014-09-07 MED ORDER — AMOXICILLIN-POT CLAVULANATE 500-125 MG PO TABS: 1.0000 | ORAL_TABLET | Freq: Three times a day (TID) | ORAL | Status: DC

## 2014-09-07 MED ORDER — WARFARIN SODIUM 2.5 MG PO TABS
2.5000 mg | ORAL_TABLET | Freq: Once | ORAL | Status: DC
  Filled 2014-09-07: qty 1

## 2014-09-07 MED ORDER — DM-GUAIFENESIN ER 30-600 MG PO TB12: 1.0000 | ORAL_TABLET | Freq: Two times a day (BID) | ORAL | Status: DC

## 2014-09-07 MED ORDER — ALBUTEROL SULFATE (2.5 MG/3ML) 0.083% IN NEBU: 2.5000 mg | INHALATION_SOLUTION | Freq: Four times a day (QID) | RESPIRATORY_TRACT | Status: AC | PRN

## 2014-09-07 NOTE — Clinical Social Work Note (Signed)
Patient will discharge to [facility]F Report #: [phone] Anticipated discharge date: 09/07/14 Family notified: Daughter, Dorian Pod, at bedside Transportation by [facility]- called at 2:10  [place] signing off.  Domenica Reamer, [place] Social Worker [phone]

## 2014-09-07 NOTE — Discharge Instructions (Signed)
Follow with Primary MD Howard Cowden, MD in 3 days   Get CBC, CMP, INR 2 view Chest X ray checked  by Primary MD next visit.    Activity: As tolerated with Full fall precautions use walker/cane & assistance as needed   Disposition ALF with family providing 24-hour supervision discussed with patient's daughter   Diet: Dysphagia 3 and nectar thick liquids  with feeding assistance and aspiration precautions as needed.   Check your Weight same time everyday, if you gain over 2 pounds, or you develop in leg swelling, experience more shortness of breath or chest pain, call your Primary MD immediately. Follow Cardiac Low Salt Diet and 1.8 lit/day fluid restriction.   On your next visit with your primary care physician please Get Medicines reviewed and adjusted.   Please request your Prim.MD to go over all Hospital Tests and Procedure/Radiological results at the follow up, please get all Hospital records sent to your Prim MD by signing hospital release before you go home.   If you experience worsening of your admission symptoms, develop shortness of breath, life threatening emergency, suicidal or homicidal thoughts you must seek medical attention immediately by calling 911 or calling your MD immediately  if symptoms less severe.  You Must read complete instructions/literature along with all the possible adverse reactions/side effects for all the Medicines you take and that have been prescribed to you. Take any new Medicines after you have completely understood and accpet all the possible adverse reactions/side effects.   Do not drive, operating heavy machinery, perform activities at heights, swimming or participation in water activities or provide baby sitting services if your were admitted for syncope or siezures until you have seen by Primary MD or a Neurologist and advised to do so again.  Do not drive when taking Pain medications.    Do not take more than prescribed Pain, Sleep  and Anxiety Medications  Special Instructions: If you have smoked or chewed Tobacco  in the last 2 yrs please stop smoking, stop any regular Alcohol  and or any Recreational drug use.  Wear Seat belts while driving.   Please note  You were cared for by a hospitalist during your hospital stay. If you have any questions about your discharge medications or the care you received while you were in the hospital after you are discharged, you can call the unit and asked to speak with the hospitalist on call if the hospitalist that took care of you is not available. Once you are discharged, your primary care physician will handle any further medical issues. Please note that NO REFILLS for any discharge medications will be authorized once you are discharged, as it is imperative that you return to your primary care physician (or establish a relationship with a primary care physician if you do not have one) for your aftercare needs so that they can reassess your need for medications and monitor your lab values.

## 2014-09-07 NOTE — Progress Notes (Signed)
Patient discharged to [facility] via [facility], daughter was at bedside, reported to nurse Alyssa.

## 2014-09-07 NOTE — Telephone Encounter (Signed)
Pt was dc'd from hospital today and instructed to fu w/ pcp in 3 days.  alos pt needs to get a chest xray. Pt had pneum in both lungs. Is it ok to put 2 appt together on Friday am to make a 30 min in case pt needs to go in the pm for xray? Pt also instructed to have cbc, bmp, and  I & R coum along w/ xray. pls advise.   Daughter also following up on breathing treatments. She states they need to be every 4-6 hrs per dr Marthann Schiller previous instructions. She would like to know that this has been changed b/c he was not getting these treatments.

## 2014-09-07 NOTE — Discharge Summary (Signed)
Howard Payne, is a 80 y.o. male  DOB Mar 24, 1920  MRN [id number].  Admission date:  09/02/2014  Admitting Physician  Theodis Blaze, MD  Discharge Date:  09/07/2014   Primary MD  Nyoka Cowden, MD  Recommendations for primary care physician for things to follow:   Pneumonia possible aspiration, please check CBC, BMP INR is 3 days. Repeat 2 view chest x-ray and a week.   Admission Diagnosis  RESP DISTRESS   Discharge Diagnosis  RESP DISTRESS    Active Problems:   Pneumonia      Past Medical History  Diagnosis Date  . Irregular heart rate   . Macular degeneration   . Cognitive impairment   . Hypotension   . 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 Glaucoma   . Atrial fibrillation   . Dysrhythmia   . GERD (gastroesophageal reflux disease)   . Dementia   . CHF (congestive heart failure)   . Pneumonia 09/05/2014    Past Surgical History  Procedure Laterality Date  . Mohs surgery  2006, 07/06/12    Left Lower Eyelid: Invasive Squamous Cell Carcinoma  . Shave biopsy  05/14/12    Left Lower Forehead  . Skin punch biopsy  05/14/12    Mid Left Lower Eyelid  . Eye surgery         History of present illness and  Hospital Course:     Kindly see H&P for history of present illness and admission details, please review complete Labs, Consult reports and Test reports for all details in brief  HPI  80 y/o male with PMH of A fib, HTN, CHF (probable diastolic) presented with SOB, DOE, fevers, chills, cough productive of yellow sputum admitted with Pneumonia     Hospital Course    1. Aspiration pneumonia. Causing acute hypoxic respiratory failure her admission. Much improved, responded to empiric IV antibiotics,  seen by speech therapy, currently on dysphagia 3 diet with nectar thick liquids. Will be discharged on same diet with home speech therapy followup, continue feeding assistance aspiration precautions. We'll place him on Augmentin for 5 more days. Request PCP to check CBC, BMP INR and a 2 view chest x-ray in the outpatient setting.   2. Mild acute on chronic diastolic CHF EF 51-02%. Treated with Lasix, now euvolemic on exam, discharged on fluid restriction. Request PCP to monitor weight, edema and rales closely, can use scheduled diuretic if needed in the future.   3. Atrial fibrillation on Coumadin. Continue beta blocker for rate control, on Coumadin, INR therapeutic. We'll request PCP to consider long-term use of Coumadin in this patient considering he is a high fall risk.   4. Dementia. He is confused at baseline, discussed with daughter in detail, she wants to take him to assisted living facility realizes that he is a high fall risk, says this is close to his baseline, she will provide close  supervision at all times. Does not want SNF.  Discharge Condition: stable   Follow UP  Follow-up Information   Follow up with Nyoka Cowden, MD. Schedule an appointment as soon as possible for a visit in 3 days. (CBC, BMP and INR check. Two-view chest x-ray check.)    Specialty:  Internal Medicine   Contact information:   [place] [address] [phone]         Discharge Instructions  and  Discharge Medications      Discharge Instructions   Discharge instructions    Complete by:  As directed   Follow with Primary MD Nyoka Cowden, MD in 3 days   Get CBC, CMP, INR 2 view Chest X ray checked  by Primary MD next visit.    Activity: As tolerated with Full fall precautions use walker/cane & assistance as needed   Disposition ALF with family providing 24-hour supervision discussed with patient's daughter   Diet: Dysphagia 3 and nectar thick  liquids  with feeding assistance and aspiration precautions as needed.   Check your Weight same time everyday, if you gain over 2 pounds, or you develop in leg swelling, experience more shortness of breath or chest pain, call your Primary MD immediately. Follow Cardiac Low Salt Diet and 1.8 lit/day fluid restriction.   On your next visit with your primary care physician please Get Medicines reviewed and adjusted.   Please request your Prim.MD to go over all Hospital Tests and Procedure/Radiological results at the follow up, please get all Hospital records sent to your Prim MD by signing hospital release before you go home.   If you experience worsening of your admission symptoms, develop shortness of breath, life threatening emergency, suicidal or homicidal thoughts you must seek medical attention immediately by calling 911 or calling your MD immediately  if symptoms less severe.  You Must read complete instructions/literature along with all the possible adverse reactions/side effects for all the Medicines you take and that have been prescribed to you. Take any new Medicines after you have completely understood and accpet all the possible adverse reactions/side effects.   Do not drive, operating heavy machinery, perform activities at heights, swimming or participation in water activities or provide baby sitting services if your were admitted for syncope or siezures until you have seen by Primary MD or a Neurologist and advised to do so again.  Do not drive when taking Pain medications.    Do not take more than prescribed Pain, Sleep and Anxiety Medications  Special Instructions: If you have smoked or chewed Tobacco  in the last 2 yrs please stop smoking, stop any regular Alcohol  and or any Recreational drug use.  Wear Seat belts while driving.   Please note  You were cared for by a hospitalist during your hospital stay. If you have any questions about your discharge medications or the  care you received while you were in the hospital after you are discharged, you can call the unit and asked to speak with the hospitalist on call if the hospitalist that took care of you is not available. Once you are discharged, your primary care physician will handle any further medical issues. Please note that NO REFILLS for any discharge medications will be authorized once you are discharged, as it is imperative that you return to your primary care physician (or establish a relationship with a primary care physician if you do not have one) for your aftercare needs so that they can reassess  your need for medications and monitor your lab values.     Increase activity slowly    Complete by:  As directed             Medication List         acetaminophen 325 MG tablet  Commonly known as:  TYLENOL  Take 650 mg by mouth 3 (three) times daily.     albuterol (2.5 MG/3ML) 0.083% nebulizer solution  Commonly known as:  PROVENTIL  Take 3 mLs (2.5 mg total) by nebulization every 6 (six) hours as needed for wheezing or shortness of breath.     amoxicillin-clavulanate 500-125 MG per tablet  Commonly known as:  AUGMENTIN  Take 1 tablet (500 mg total) by mouth 3 (three) times daily.     BENADRYL 25 MG tablet  Generic drug:  diphenhydrAMINE  Take 25 mg by mouth every 6 (six) hours as needed for itching.     dextromethorphan-guaiFENesin 30-600 MG per 12 hr tablet  Commonly known as:  MUCINEX DM  Take 1 tablet by mouth 2 (two) times daily.     diclofenac sodium 1 % Gel  Commonly known as:  VOLTAREN  Apply 2 g topically 2 (two) times daily as needed (for right knee).     digoxin 0.125 MG tablet  Commonly known as:  LANOXIN  Take 0.125 mg by mouth daily.     guaiFENesin 600 MG 12 hr tablet  Commonly known as:  MUCINEX  Take 600 mg by mouth 2 (two) times daily as needed for cough or to loosen phlegm.     latanoprost 0.005 % ophthalmic solution  Commonly known as:  XALATAN  Place 1 drop into  both eyes at bedtime.     NAMENDA XR 28 MG Cp24  Generic drug:  Memantine HCl ER  Take 28 mg by mouth daily.     omeprazole 20 MG capsule  Commonly known as:  PRILOSEC  Take 1 capsule (20 mg total) by mouth daily.     polyvinyl alcohol 1.4 % ophthalmic solution  Commonly known as:  LIQUIFILM TEARS  Place 1 drop into the left eye daily as needed for dry eyes.     sertraline 25 MG tablet  Commonly known as:  ZOLOFT  Take 1 tablet (25 mg total) by mouth daily.     SYSTANE 0.4-0.3 % Gel  Generic drug:  Polyethyl Glycol-Propyl Glycol  Place 1 application into the left eye at bedtime. On to eyelid     warfarin 5 MG tablet  Commonly known as:  COUMADIN  - Take 2.5-5 mg by mouth daily at 6 PM. Takes 5mg  on tues and sat only  - Takes 2.5mg  all other days          Diet and Activity recommendation: See Discharge Instructions above   Consults obtained - none   Major procedures and Radiology Reports - PLEASE review detailed and final reports for all details, in brief -       Dg Chest 2 View  09/02/2014   CLINICAL DATA:  80 year old male with nonproductive cough for the past week. Initial encounter.  EXAM: CHEST  2 VIEW  COMPARISON:  04/24/2014 and 04/21/2014.  FINDINGS: Chronic increased lung markings most notable lung bases. This appears slightly different than on the prior exam and subtle superimposed right lower lobe infiltrate cannot be excluded in the proper clinical setting.  Cardiomegaly.  Central pulmonary vascular prominence.  No gross pneumothorax.  Calcified tortuous aorta  IMPRESSION: Chronic increased  lung markings most notable lung bases. This appears slightly different than on the prior exam and subtle superimposed right lower lobe infiltrate cannot be excluded in the proper clinical setting.  Cardiomegaly.  Calcified tortuous aorta   Electronically Signed   By: Chauncey Cruel M.D.   On: 09/02/2014 21:08   Dg Chest Port 1 View  09/03/2014   CLINICAL DATA:  Shortness  of breath.  EXAM: PORTABLE CHEST - 1 VIEW  COMPARISON:  09/02/2014 and 04/24/2014  FINDINGS: Patient is slightly rotated to the right. Lungs are adequately inflated with mild worsening bibasilar opacification right worse than left as cannot exclude infection. No definite effusion. Mild stable cardiomegaly. Calcified plaque over the thoracic aorta. Degenerative changes of the spine.  IMPRESSION: Slight worsening bibasilar opacification right worse than left as cannot exclude developing infection.  Stable cardiomegaly.   Electronically Signed   By: Marin Olp M.D.   On: 09/03/2014 07:45    Micro Results      Recent Results (from the past 240 hour(s))  CULTURE, BLOOD (ROUTINE X 2)     Status: None   Collection Time    09/02/14  9:31 PM      Result Value Ref Range Status   Specimen Description BLOOD ARM RIGHT   Final   Special Requests BOTTLES DRAWN AEROBIC AND ANAEROBIC 5CC   Final   Culture  Setup Time     Final   Value: 09/03/2014 03:29     Performed at [facility]   Culture     Final   Value:        BLOOD CULTURE RECEIVED NO GROWTH TO DATE CULTURE WILL BE HELD FOR 5 DAYS BEFORE ISSUING A FINAL NEGATIVE REPORT     Performed at [facility]   Report Status PENDING   Incomplete  CULTURE, BLOOD (ROUTINE X 2)     Status: None   Collection Time    09/02/14  9:38 PM      Result Value Ref Range Status   Specimen Description BLOOD HAND RIGHT   Final   Special Requests BOTTLES DRAWN AEROBIC AND ANAEROBIC 5CC   Final   Culture  Setup Time     Final   Value: 09/03/2014 03:31     Performed at [facility]   Culture     Final   Value:        BLOOD CULTURE RECEIVED NO GROWTH TO DATE CULTURE WILL BE HELD FOR 5 DAYS BEFORE ISSUING A FINAL NEGATIVE REPORT     Performed at [facility]   Report Status PENDING   Incomplete  URINE CULTURE     Status: None   Collection Time    09/02/14 10:03 PM      Result Value Ref Range Status   Specimen Description URINE, RANDOM    Final   Special Requests NONE   Final   Culture  Setup Time     Final   Value: 09/02/2014 22:47     Performed at [facility]     Final   Value: 50,000 COLONIES/ML     Performed at [facility]   Culture     Final   Value: Multiple bacterial morphotypes present, none predominant. Suggest appropriate recollection if clinically indicated.     Performed at [facility]   Report Status 09/04/2014 FINAL   Final  MRSA PCR SCREENING     Status: Abnormal   Collection Time  09/03/14  2:04 AM      Result Value Ref Range Status   MRSA by PCR POSITIVE (*) NEGATIVE Final   Comment:            The GeneXpert MRSA Assay (FDA     approved for NASAL specimens     only), is one component of a     comprehensive MRSA colonization     surveillance program. It is not     intended to diagnose MRSA     infection nor to guide or     monitor treatment for     MRSA infections.     RESULT CALLED TO, READ BACK BY AND VERIFIED WITH:     CALLED TO RN Earvin Hansen [phone] @[id number]  THANEY  CULTURE, EXPECTORATED SPUTUM-ASSESSMENT     Status: None   Collection Time    09/04/14  5:55 PM      Result Value Ref Range Status   Specimen Description SPUTUM   Final   Special Requests NONE   Final   Sputum evaluation     Final   Value: THIS SPECIMEN IS ACCEPTABLE. RESPIRATORY CULTURE REPORT TO FOLLOW.   Report Status 09/04/2014 FINAL   Final  CULTURE, RESPIRATORY (NON-EXPECTORATED)     Status: None   Collection Time    09/04/14  5:55 PM      Result Value Ref Range Status   Specimen Description SPUTUM   Final   Special Requests NONE   Final   Gram Stain     Final   Value: NO WBC SEEN     NO SQUAMOUS EPITHELIAL CELLS SEEN     NO ORGANISMS SEEN     Performed at [facility]   Culture     Final   Value: NORMAL OROPHARYNGEAL FLORA     Performed at [facility]   Report Status 09/07/2014 FINAL   Final       Today   Subjective:   Howard Payne  today has no headache,no chest abdominal pain,no new weakness tingling or numbness, feels much better wants to go home today.     Objective:   Blood pressure 128/72, pulse 73, temperature 97.9 F (36.6 C), temperature source Oral, resp. rate 17, height 6' (1.829 m), weight 83.371 kg (183 lb 12.8 oz), SpO2 99.00%.   Intake/Output Summary (Last 24 hours) at 09/07/14 1145 Last data filed at 09/07/14 0900  Gross per 24 hour  Intake    324 ml  Output    900 ml  Net   -576 ml    Exam Awake but pleasantly confused, No new F.N deficits, Normal affect [place].AT,PERRAL Supple Neck,No JVD, No cervical lymphadenopathy appriciated.  Symmetrical Chest wall movement, Good air movement bilaterally, minimal bibasal rales RRR,No Gallops,Rubs or new Murmurs, No Parasternal Heave +ve B.Sounds, Abd Soft, Non tender, No organomegaly appriciated, No rebound -guarding or rigidity. No Cyanosis, Clubbing or edema, No new Rash or bruise  Data Review   CBC w Diff: Lab Results  Component Value Date   WBC 9.0 09/05/2014   HGB 15.1 09/05/2014   HCT 44.6 09/05/2014   PLT 205 09/05/2014   LYMPHOPCT 24 09/02/2014   MONOPCT 15* 09/02/2014   EOSPCT 4 09/02/2014   BASOPCT 0 09/02/2014    CMP: Lab Results  Component Value Date   NA 134* 09/05/2014   K 4.2 09/05/2014   CL 97 09/05/2014   CO2 26 09/05/2014   BUN 17 09/05/2014   CREATININE 0.81 09/05/2014  PROT 7.5 09/02/2014   ALBUMIN 3.6 09/02/2014   BILITOT 1.1 09/02/2014   ALKPHOS 89 09/02/2014   AST 29 09/02/2014   ALT 22 09/02/2014  .   Total Time in preparing paper work, data evaluation and todays exam - 35 minutes  Thurnell Lose M.D on 09/07/2014 at [place]  [phone]

## 2014-09-07 NOTE — Progress Notes (Signed)
ANTICOAGULATION CONSULT NOTE - Follow Up Consult  Pharmacy Consult for warfarin Indication: atrial fibrillation  No Known Allergies  Patient Measurements: Height: 6' (182.9 cm) Weight: 183 lb 12.8 oz (83.371 kg) IBW/kg (Calculated) : 77.6   Vital Signs: Temp: 97.9 F (36.6 C) (10/21 0526) Temp Source: Oral (10/21 0526) BP: 128/72 mmHg (10/21 0526) Pulse Rate: 73 (10/21 0526)  Labs:  Recent Labs  09/05/14 0724 09/06/14 0500 09/07/14 0307  HGB 15.1  --   --   HCT 44.6  --   --   PLT 205  --   --   LABPROT 31.4* 29.9* 26.7*  INR 3.00* 2.82* 2.44*  CREATININE 0.81  --   --     Estimated Creatinine Clearance: 63.9 ml/min (by C-G formula based on Cr of 0.81).   Assessment: 59 YOM with dementia admitted with PNA. Home dose of warfarin 2.5mg  daily except 5mg  on Tuesdays and Saturdays. Last dose PTA was 10/15, dose was missed on 10/16 while he was in the ED here. He received his normal scheduled doses on Saturday 10/17 (5mg ) and Sunday 10/18 (2.5mg )> INR yesterday morning rose to top of therapeutic range at 3. Dose was held 10/19 and INR yesterday morning was down to 2.82. This morning, INR fell further to 2.44- this is likely the full effect of holding a dose, and last evening's dose is not yet fully seen on INR CBC is WNL and no bleeding noted.  Goal of Therapy:  INR 2-3 Monitor platelets by anticoagulation protocol: Yes  Plan:  1. Warfarin 2.5mg  po x1 tonight  2. Daily PT/INR 3. Follow closely for s/s bleeding 4. CBC and BMET in the morning  Caroline Longie D. Charmin Aguiniga, PharmD, BCPS Clinical Pharmacist Pager: [phone] 09/07/2014 8:48 AM

## 2014-09-07 NOTE — Evaluation (Addendum)
Occupational Therapy Evaluation Patient Details Name: Howard Payne MRN: [id number] DOB: 1919/12/08 Today's Date: 09/07/2014    History of Present Illness Pt is 80 yo male, with h/o skin cancer, a fib, HTN, dementia, CHF who is resident of ALF, who presented to [facility] ED with main concern of several days duration of progressively worsening dyspnea.  Dx of PNA.    Clinical Impression   Pt admitted with above. Pt requiring assist with ADLs, PTA. Spoke with daughter a while-recommended considering SNF and family open to this (educated on safety concern). Pt about to d/c today.     Follow Up Recommendations  Home health OT;Supervision/Assistance - 24 hour    Equipment Recommendations  None recommended by OT    Recommendations for Other Services       Precautions / Restrictions Precautions Precautions: Fall Restrictions Weight Bearing Restrictions: No      Mobility Bed Mobility Overal bed mobility: Needs Assistance Bed Mobility: Supine to Sit     Supine to sit: Supervision        Transfers Overall transfer level: Needs assistance Equipment used: Rolling walker (2 wheeled) Transfers: Sit to/from Stand Sit to Stand: Min guard         General transfer comment: Min guard for safety    Balance                                            ADL Overall ADL's : Needs assistance/impaired     Grooming: Wash/dry face;Applying deodorant;Brushing hair;Minimal assistance;Standing   Upper Body Bathing: Min guard;Standing           Lower Body Dressing: Minimal assistance;Sit to/from stand   Toilet Transfer: Minimal assistance;Ambulation;RW (chair/bed)           Functional mobility during ADLs: Minimal assistance;Rolling walker General ADL Comments: Spoke with daughter about pt's PLOF and facility where he is at (care provided). Spoke with them about considering SNF (discussed SNF venue) and how OT is recommending 24/7 supervision and explained safety  concern. Pt performed ADLs during session. Educated on deep breathing technique.  Min A for balance as pt donned pants.     Vision  macular degeneration                   Perception     Praxis      Pertinent Vitals/Pain Pain Assessment: No/denies pain     Hand Dominance     Extremity/Trunk Assessment Upper Extremity Assessment Upper Extremity Assessment: Overall WFL for tasks assessed   Lower Extremity Assessment Lower Extremity Assessment: Defer to PT evaluation   Cervical / Trunk Assessment Cervical / Trunk Assessment: Kyphotic   Communication Communication Communication: No difficulties   Cognition Arousal/Alertness: Awake/alert Behavior During Therapy: WFL for tasks assessed/performed Overall Cognitive Status: History of cognitive impairments - at baseline                     General Comments       Exercises       Shoulder Instructions      Home Living Family/patient expects to be discharged to:: Assisted living                             Home Equipment: Walker - 2 wheels;Shower seat; RW          Prior  Functioning/Environment Level of Independence: Needs assistance  Gait / Transfers Assistance Needed: uses RW ADL's / Homemaking Assistance Needed: Assistance with bathing and dressing (a lot according to daughter)        OT Diagnosis: Generalized weakness   OT Problem List: Decreased strength;Decreased activity tolerance;Impaired balance (sitting and/or standing);Decreased knowledge of use of DME or AE;Decreased knowledge of precautions;Decreased cognition   OT Treatment/Interventions:      OT Goals(Current goals can be found in the care plan section)    OT Frequency:     Barriers to D/C:            Co-evaluation              End of Session Equipment Utilized During Treatment: Gait belt;Rolling walker  Activity Tolerance: Patient tolerated treatment well Patient left: in chair;with nursing/sitter in  room;with family/visitor present   Time: 0233-4356 OT Time Calculation (min): 39 min Charges:  OT General Charges $OT Visit: 1 Procedure OT Evaluation $Initial OT Evaluation Tier I: 1 Procedure OT Treatments $Self Care/Home Management : 8-22 mins G-CodesBenito Mccreedy OTR/L [phone] 09/07/2014, 4:14 PM

## 2014-09-08 ENCOUNTER — Telehealth: Payer: Self-pay | Admitting: Internal Medicine

## 2014-09-08 NOTE — Telephone Encounter (Signed)
Noted  

## 2014-09-08 NOTE — Telephone Encounter (Signed)
Please see message and advise 

## 2014-09-08 NOTE — Telephone Encounter (Signed)
Hospice got a call from pt's daughter Arthor Captain) stating that he is in decline and they would like a hospice consult. Would like an order from Dr Raliegh Ip and to know if he agrees to be attending and if so, would you want sx mgmt from hospice MD's.

## 2014-09-08 NOTE — Telephone Encounter (Signed)
All ok

## 2014-09-08 NOTE — Telephone Encounter (Signed)
Called Hospice back and spoke to [facility], gave verbal order for Hospice consult and Dr Raliegh Ip agrees to be attending and does want sx mgmt from hospice MD's. Evette verbalized understanding.

## 2014-09-08 NOTE — Telephone Encounter (Signed)
LM on vm to confirm appt

## 2014-09-09 ENCOUNTER — Ambulatory Visit: Payer: Medicare Other | Admitting: Internal Medicine

## 2014-09-09 LAB — CULTURE, BLOOD (ROUTINE X 2)
CULTURE: NO GROWTH
CULTURE: NO GROWTH

## 2014-09-19 ENCOUNTER — Telehealth: Payer: Self-pay | Admitting: Internal Medicine

## 2014-09-19 NOTE — Telephone Encounter (Signed)
Hospice is requesting for pt to start on regular diet and regular fluids, not thickened.  Pt is at [facility] [phone] Attn: Lenna Sciara

## 2014-09-19 NOTE — Telephone Encounter (Signed)
Received request in writing from [facility], Dr.K will sign and it will be faxed back.

## 2014-10-19 ENCOUNTER — Ambulatory Visit: Payer: PRIVATE HEALTH INSURANCE | Admitting: Internal Medicine

## 2014-11-07 ENCOUNTER — Telehealth: Payer: Self-pay | Admitting: Family

## 2014-11-07 ENCOUNTER — Ambulatory Visit (INDEPENDENT_AMBULATORY_CARE_PROVIDER_SITE_OTHER): Payer: Medicare Other | Admitting: Family

## 2014-11-07 DIAGNOSIS — I482 Chronic atrial fibrillation, unspecified: Secondary | ICD-10-CM

## 2014-11-07 DIAGNOSIS — Z5181 Encounter for therapeutic drug level monitoring: Secondary | ICD-10-CM

## 2014-11-07 NOTE — Telephone Encounter (Signed)
Needs a PT/INR. Please call and advise [facility] to check asap.

## 2014-11-07 NOTE — Telephone Encounter (Signed)
Pt had INR in November and it was faxed to [facility]. I am unsure who that is, however, Laquita at [facility] will have INR drawn tomorrow and faxed here

## 2014-11-07 NOTE — Telephone Encounter (Signed)
LaQuita called back from [facility] to advise that wafarin was d/c by Dr. Raliegh Ip because pt is on Hospice care

## 2014-12-27 ENCOUNTER — Telehealth: Payer: Self-pay | Admitting: Internal Medicine

## 2014-12-27 NOTE — Telephone Encounter (Signed)
ROV if desired

## 2014-12-27 NOTE — Telephone Encounter (Signed)
Called [facility], told her Dr. Raliegh Ip said he will not order diuretics without seeing pt. Return office visit tomorrow if family desires. [name] verbalized understanding and will let family know.

## 2014-12-27 NOTE — Telephone Encounter (Signed)
Please see message and advise 

## 2014-12-27 NOTE — Telephone Encounter (Addendum)
Wanted to give update on pt. Weight 183 in Oct. Jan 189.8  Slowly progressed.  187.8 Feb  Edema in right l  Left let 2-3 plus in calf.  Not cold or hot, pt is walking A little wheezing  A breathing treatment will be given Pt not on diuretic  No fluid sounds in lungs Pt much more tired.  [facility] if any orders to be given  Pt also seems to have some tracheal strider.  Pt has esophagus stretched before, and may need again/  I looks as though when pt swallows he is having increased difficulty , its taking more effort, pt is frowning.

## 2014-12-28 ENCOUNTER — Encounter: Payer: Self-pay | Admitting: Internal Medicine

## 2014-12-28 ENCOUNTER — Ambulatory Visit (INDEPENDENT_AMBULATORY_CARE_PROVIDER_SITE_OTHER): Payer: Medicare Other | Admitting: Internal Medicine

## 2014-12-28 VITALS — BP 78/50 | HR 60 | Temp 98.0°F | Resp 16

## 2014-12-28 DIAGNOSIS — R4189 Other symptoms and signs involving cognitive functions and awareness: Secondary | ICD-10-CM | POA: Diagnosis not present

## 2014-12-28 DIAGNOSIS — I482 Chronic atrial fibrillation, unspecified: Secondary | ICD-10-CM

## 2014-12-28 DIAGNOSIS — I4819 Other persistent atrial fibrillation: Secondary | ICD-10-CM

## 2014-12-28 DIAGNOSIS — Z7901 Long term (current) use of anticoagulants: Secondary | ICD-10-CM

## 2014-12-28 DIAGNOSIS — I481 Persistent atrial fibrillation: Secondary | ICD-10-CM | POA: Diagnosis not present

## 2014-12-28 NOTE — Progress Notes (Signed)
Subjective:    Patient ID: Howard Payne, male    DOB: [birth year], 81 y.o.   MRN: [id number]  HPI  81 year old patient who is at an extended care facility and also followed by hospice.  A phone contact was made yesterday, requesting consideration of diuretic therapy due to worsening lower extremity edema.  Patient is seen today for assessment He has a history of chronic atrial fibrillation but chronic anticoagulation has been discontinued several months ago.  Comfort measures only are in place The patient has a history of aspiration pneumonia and was hospitalized in October of last year.  He does have a nebulizer treatment ordered as needed for cough, shortness of breath or wheezing.  His pulmonary status.  Apparently has been stable.  He is accompanied by his daughter and son-in-law today.  She relates a history of becoming progressively more weak.  He does have fairly advanced dementia and is not able to provide a meaningful history  Past Medical History  Diagnosis Date  . Irregular heart rate   . Macular degeneration   . Cognitive impairment   . Hypotension   . 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 Glaucoma   . Atrial fibrillation   . Dysrhythmia   . GERD (gastroesophageal reflux disease)   . Dementia   . CHF (congestive heart failure)   . Pneumonia 09/05/2014    History   Social History  . Marital Status: Widowed    Spouse Name: N/A  . Number of Children: N/A  . Years of Education: N/A   Occupational History  . Not on file.   Social History Main Topics  . Smoking status: Never Smoker   . Smokeless tobacco: Never Used  . Alcohol Use: No  . Drug Use: No  . Sexual Activity: No   Other Topics Concern  . Not on file   Social History Narrative    Past Surgical History  Procedure Laterality Date  . Mohs surgery  2006, 07/06/12    Left Lower Eyelid: Invasive  Squamous Cell Carcinoma  . Shave biopsy  05/14/12    Left Lower Forehead  . Skin punch biopsy  05/14/12    Mid Left Lower Eyelid  . Eye surgery      Family History  Problem Relation Age of Onset  . Skin cancer Sister     Non-melanoma  . Stroke Mother   . Stroke Father   . Diabetes Father     No Known Allergies  Current Outpatient Prescriptions on File Prior to Visit  Medication Sig Dispense Refill  . acetaminophen (TYLENOL) 325 MG tablet Take 650 mg by mouth 3 (three) times daily.    Marland Kitchen albuterol (PROVENTIL) (2.5 MG/3ML) 0.083% nebulizer solution Take 3 mLs (2.5 mg total) by nebulization every 6 (six) hours as needed for wheezing or shortness of breath. 75 mL 12  . diclofenac sodium (VOLTAREN) 1 % GEL Apply 2 g topically 2 (two) times daily as needed (for right knee).     Marland Kitchen digoxin (LANOXIN) 0.125 MG tablet Take 0.125 mg by mouth daily.    . diphenhydrAMINE (BENADRYL) 25 MG tablet Take 25 mg by mouth every 6 (six) hours as needed for itching.    Marland Kitchen guaiFENesin (MUCINEX) 600 MG 12 hr tablet Take 600 mg by mouth 2 (two) times daily as needed for cough or to loosen phlegm.    Marland Kitchen  latanoprost (XALATAN) 0.005 % ophthalmic solution Place 1 drop into both eyes at bedtime.    . Memantine HCl ER (NAMENDA XR) 28 MG CP24 Take 28 mg by mouth daily.    Marland Kitchen omeprazole (PRILOSEC) 20 MG capsule Take 1 capsule (20 mg total) by mouth daily. 90 capsule 3  . Polyethyl Glycol-Propyl Glycol (SYSTANE) 0.4-0.3 % GEL Place 1 application into the left eye at bedtime. On to eyelid    . polyvinyl alcohol (LIQUIFILM TEARS) 1.4 % ophthalmic solution Place 1 drop into the left eye daily as needed for dry eyes.    Marland Kitchen sertraline (ZOLOFT) 25 MG tablet Take 1 tablet (25 mg total) by mouth daily. 30 tablet 11   No current facility-administered medications on file prior to visit.    BP 78/50 mmHg  Pulse 60  Temp(Src) 98 F (36.7 C) (Oral)  Resp 16  SpO2 96%     Review of Systems  Constitutional: Negative for  fever, chills, appetite change and fatigue.  HENT: Negative for congestion, dental problem, ear pain, hearing loss, sore throat, tinnitus, trouble swallowing and voice change.   Eyes: Negative for pain, discharge and visual disturbance.  Respiratory: Negative for cough, chest tightness, wheezing and stridor.   Cardiovascular: Positive for leg swelling. Negative for chest pain and palpitations.  Gastrointestinal: Negative for nausea, vomiting, abdominal pain, diarrhea, constipation, blood in stool and abdominal distention.  Genitourinary: Negative for urgency, hematuria, flank pain, discharge, difficulty urinating and genital sores.  Musculoskeletal: Negative for myalgias, back pain, joint swelling, arthralgias, gait problem and neck stiffness.  Skin: Negative for rash.  Neurological: Negative for dizziness, syncope, speech difficulty, weakness, numbness and headaches.  Hematological: Negative for adenopathy. Does not bruise/bleed easily.  Psychiatric/Behavioral: Positive for confusion and decreased concentration. Negative for behavioral problems and dysphoric mood. The patient is not nervous/anxious.        Objective:   Physical Exam  Constitutional: He is oriented to person, place, and time. He appears well-developed.  Blood pressure 80/50  HENT:  Head: Normocephalic.  Right Ear: External ear normal.  Left Ear: External ear normal.  Eyes: Conjunctivae and EOM are normal.  Neck: Normal range of motion.  Cardiovascular: Normal rate and normal heart sounds.   Pulmonary/Chest: Effort normal and breath sounds normal.  Chest is clear posteriorly Scattered upper airway rhonchi noted anteriorly No respiratory distress Oxygen saturation 96  Abdominal: Bowel sounds are normal.  Musculoskeletal: Normal range of motion. He exhibits edema. He exhibits no tenderness.  Trace bilateral lower extremity edema TED hose in place  Neurological: He is alert and oriented to person, place, and time.    Psychiatric: He has a normal mood and affect. His behavior is normal.          Assessment & Plan:   Mild lower extremity edema Hypotension.  Probable component of volume depletion.  Family states that he eats fairly well, but poor fluid intake.  Will encourage more liberal intake of fluids Cognitive impairment.  We'll continue comfort measures.  Hospice will continue to follow History diastolic heart failure.  Appears to be compensated  Return here when necessary

## 2014-12-28 NOTE — Progress Notes (Signed)
Pre visit review using our clinic review tool, if applicable. No additional management support is needed unless otherwise documented below in the visit note. 

## 2015-01-17 ENCOUNTER — Encounter: Payer: Self-pay | Admitting: Internal Medicine

## 2015-02-01 ENCOUNTER — Telehealth: Payer: Self-pay | Admitting: Internal Medicine

## 2015-02-01 NOTE — Telephone Encounter (Signed)
[name] states patient has pulse 100, BP 110/74, oxygen 94% room air, takes digoxin (LANOXIN) 0.125 MG tablet daily, and complains of being tired.  She states his BP normally ranges 90-92 and he usually states he's tired as a complaint.  She would like to know a plan of care, if any for his BP concerns.  She said an order can be faxed to [phone].

## 2015-02-02 NOTE — Telephone Encounter (Signed)
Continue observation.  No change in therapy

## 2015-02-02 NOTE — Telephone Encounter (Signed)
Called [facility] and left detailed message on her personal voicemail to continue observation, no change in therapy per Dr.K. Call if any questions.

## 2015-02-02 NOTE — Telephone Encounter (Signed)
Please see message and advise 

## 2015-02-13 ENCOUNTER — Encounter (HOSPITAL_COMMUNITY): Payer: Self-pay

## 2015-02-13 ENCOUNTER — Emergency Department (HOSPITAL_COMMUNITY)
Admission: EM | Admit: 2015-02-13 | Discharge: 2015-02-14 | Disposition: A | Discharge status: Home / Self Care | Attending: Emergency Medicine | Admitting: Emergency Medicine

## 2015-02-13 DIAGNOSIS — Z8701 Personal history of pneumonia (recurrent): Secondary | ICD-10-CM | POA: Diagnosis not present

## 2015-02-13 DIAGNOSIS — Z872 Personal history of diseases of the skin and subcutaneous tissue: Secondary | ICD-10-CM | POA: Diagnosis not present

## 2015-02-13 DIAGNOSIS — Y9301 Activity, walking, marching and hiking: Secondary | ICD-10-CM | POA: Insufficient documentation

## 2015-02-13 DIAGNOSIS — I4891 Unspecified atrial fibrillation: Secondary | ICD-10-CM | POA: Insufficient documentation

## 2015-02-13 DIAGNOSIS — Z9181 History of falling: Secondary | ICD-10-CM | POA: Insufficient documentation

## 2015-02-13 DIAGNOSIS — Z8669 Personal history of other diseases of the nervous system and sense organs: Secondary | ICD-10-CM | POA: Diagnosis not present

## 2015-02-13 DIAGNOSIS — F0390 Unspecified dementia without behavioral disturbance: Secondary | ICD-10-CM | POA: Diagnosis not present

## 2015-02-13 DIAGNOSIS — Y998 Other external cause status: Secondary | ICD-10-CM | POA: Insufficient documentation

## 2015-02-13 DIAGNOSIS — S0101XA Laceration without foreign body of scalp, initial encounter: Secondary | ICD-10-CM | POA: Diagnosis not present

## 2015-02-13 DIAGNOSIS — W01198A Fall on same level from slipping, tripping and stumbling with subsequent striking against other object, initial encounter: Secondary | ICD-10-CM | POA: Diagnosis not present

## 2015-02-13 DIAGNOSIS — K219 Gastro-esophageal reflux disease without esophagitis: Secondary | ICD-10-CM | POA: Insufficient documentation

## 2015-02-13 DIAGNOSIS — Y929 Unspecified place or not applicable: Secondary | ICD-10-CM | POA: Insufficient documentation

## 2015-02-13 DIAGNOSIS — W19XXXA Unspecified fall, initial encounter: Secondary | ICD-10-CM

## 2015-02-13 DIAGNOSIS — S0990XA Unspecified injury of head, initial encounter: Secondary | ICD-10-CM | POA: Diagnosis present

## 2015-02-13 DIAGNOSIS — I509 Heart failure, unspecified: Secondary | ICD-10-CM | POA: Diagnosis not present

## 2015-02-13 DIAGNOSIS — Z79899 Other long term (current) drug therapy: Secondary | ICD-10-CM | POA: Insufficient documentation

## 2015-02-13 NOTE — ED Notes (Signed)
Per EMS: pt coming from [place] with c/o fall. Pt was walking to his bathroom when his feet slipped out from underneath him. Pt denies LOC, no neck/back pain. Per staff pt's neuro status is pt's norm. No tenderness. Pt presents with a 3 inch laceration to posterior head, pt is not on blood thinners.

## 2015-02-13 NOTE — ED Provider Notes (Signed)
CSN: [id number]     Arrival date & time 02/13/15  2332 History   First MD Initiated Contact with Patient 02/13/15 2341     This chart was scribed for No att. providers found by Forrestine Him, ED Scribe. This patient was seen in room D30C/D30C and the patient's care was started 11:57 PM.   Chief Complaint  Patient presents with  . Fall  . Head Laceration   The history is provided by the patient and a relative. No language interpreter was used.    HPI Comments: Howard Payne brought in by EMS from [facility] is a 81 y.o. male with a PMHx of macular degeneration, CHF, and GERD who presents to the Emergency Department here after an unwitnessed fall sustained just prior to arrival. Pt was walking to the bathroom when his feet slipped out from underneath him resulting in him falling. Daughter reports history of previous falls. He presents with an open wound to the head. No LOC at time of fall. Mr. Lautner denies any other pain at this time. Pt ambulates with a cane/walker occasionally at baseline but was not using any assisted devices at time of fall. Pt is not currently on Coumadin as this medication was stopped approximately 6 months ago. No known allergies to medications.  Past Medical History  Diagnosis Date  . Irregular heart rate   . Macular degeneration   . Cognitive impairment   . Hypotension   . 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 Glaucoma   . Atrial fibrillation   . Dysrhythmia   . GERD (gastroesophageal reflux disease)   . Dementia   . CHF (congestive heart failure)   . Pneumonia 09/05/2014   Past Surgical History  Procedure Laterality Date  . Mohs surgery  2006, 07/06/12    Left Lower Eyelid: Invasive Squamous Cell Carcinoma  . Shave biopsy  05/14/12    Left Lower Forehead  . Skin punch biopsy  05/14/12    Mid Left Lower Eyelid  . Eye surgery      Family History  Problem Relation Age of Onset  . Skin cancer Sister     Non-melanoma  . Stroke Mother   . Stroke Father   . Diabetes Father    History  Substance Use Topics  . Smoking status: Never Smoker   . Smokeless tobacco: Never Used  . Alcohol Use: No    Review of Systems  Cardiovascular: Negative for chest pain.  Gastrointestinal: Negative for abdominal pain.  Musculoskeletal: Negative for back pain, arthralgias and neck pain.  Skin: Positive for wound.  Neurological: Negative for headaches.  Hematological: Does not bruise/bleed easily.      Allergies  Review of patient's allergies indicates no known allergies.  Home Medications   Prior to Admission medications   Medication Sig Start Date End Date Taking? Authorizing Provider  acetaminophen (TYLENOL) 325 MG tablet Take 650 mg by mouth 3 (three) times daily.   Yes Historical Provider, MD  albuterol (PROVENTIL HFA;VENTOLIN HFA) 108 (90 BASE) MCG/ACT inhaler Inhale 2 puffs into the lungs every 6 (six) hours as needed for wheezing or shortness of breath.   Yes Historical Provider, MD  albuterol (PROVENTIL) (2.5 MG/3ML) 0.083% nebulizer solution Take 3 mLs (2.5 mg total) by nebulization every 6 (six) hours as needed for wheezing or shortness of breath. 09/07/14  Yes Thurnell Lose, MD  diclofenac sodium (VOLTAREN) 1 % GEL Apply 2 g topically 2 (two) times daily.    Yes Historical Provider, MD  digoxin (LANOXIN) 0.125 MG tablet Take 0.125 mg by mouth daily.   Yes Historical Provider, MD  diphenhydrAMINE (BENADRYL) 25 MG tablet Take 25 mg by mouth every 6 (six) hours as needed for itching.   Yes Historical Provider, MD  guaiFENesin (MUCINEX) 600 MG 12 hr tablet Take 600 mg by mouth 2 (two) times daily as needed for cough or to loosen phlegm. 10/05/13  Yes Marletta Lor, MD  latanoprost (XALATAN) 0.005 % ophthalmic solution Place 1 drop into both eyes at bedtime.   Yes Historical Provider, MD  Memantine HCl ER  (NAMENDA XR) 28 MG CP24 Take 28 mg by mouth daily.   Yes Historical Provider, MD  omeprazole (PRILOSEC) 20 MG capsule Take 1 capsule (20 mg total) by mouth daily. 08/18/13  Yes Marletta Lor, MD  Polyethyl Glycol-Propyl Glycol (SYSTANE) 0.4-0.3 % GEL Place 1 application into the left eye at bedtime. On to eyelid   Yes Historical Provider, MD  polyvinyl alcohol (LIQUIFILM TEARS) 1.4 % ophthalmic solution Place 1 drop into the left eye daily as needed for dry eyes.   Yes Historical Provider, MD  sertraline (ZOLOFT) 25 MG tablet Take 1 tablet (25 mg total) by mouth daily. 09/15/12  Yes Marletta Lor, MD   Triage Vitals: BP 132/78 mmHg  Pulse 93  Temp(Src) 98.5 F (36.9 C) (Oral)  Resp 16  SpO2 93%   Physical Exam  Constitutional: He is oriented to person, place, and time. He appears well-developed and well-nourished.  HENT:  Head: Normocephalic and atraumatic.  Eyes: EOM are normal.  Neck: Normal range of motion.  Cardiovascular: Normal rate, regular rhythm, normal heart sounds and intact distal pulses.   Pulmonary/Chest: Effort normal and breath sounds normal. No respiratory distress.  Abdominal: Soft. He exhibits no distension. There is no tenderness.  Musculoskeletal: Normal range of motion. He exhibits no tenderness.  No midline c-spine tenderness No step offs Pelvis stable  Neurological: He is alert and oriented to person, place, and time.  Skin: Skin is warm and dry.  7 cm R pariatal occipital region laceration with no active bleeding  Psychiatric: He has a normal mood and affect. Judgment normal.  Nursing note and vitals reviewed.   ED Course  Procedures (including critical care time)  DIAGNOSTIC STUDIES: Oxygen Saturation is 93% on RA, adequate by my interpretation.    COORDINATION OF CARE: 12:01 AM- Will repair area with staples. Discussed treatment plan with pt at bedside and pt agreed to plan.     LACERATION REPAIR Performed by: Varney Biles  MD Consent: Verbal consent obtained. Risks and benefits: risks, benefits and alternatives were discussed Patient identity confirmed: provided demographic data Time out performed prior to procedure Prepped and Draped in normal sterile fashion Wound explored Laceration Location: R parietal occipital  Laceration Length: 7 cm No Foreign Bodies seen or palpated Anesthesia: local infiltration Local anesthetic: lidocaine 2% with epinephrine Anesthetic total: 15 ml Irrigation method: syringe Amount of cleaning: standard Skin closure: Staples Number of sutures or staples: 4 Technique: N/A Patient tolerance: Patient tolerated the procedure well with no immediate complications.   Labs Review Labs Reviewed - No data to display  Imaging Review Ct Head Wo Contrast  02/14/2015   CLINICAL DATA:  Status post fall while walking to bathroom. Hit back of head. Concern for head injury. Initial encounter.  EXAM: CT HEAD WITHOUT  CONTRAST  TECHNIQUE: Contiguous axial images were obtained from the base of the skull through the vertex without intravenous contrast.  COMPARISON:  CT of the head performed 04/21/2014  FINDINGS: There is no evidence of acute infarction, mass lesion, or intra- or extra-axial hemorrhage on CT.  Prominence of the ventricles and sulci reflects moderate cortical volume loss. Cerebellar atrophy is noted. Diffuse periventricular and subcortical white matter change likely reflects small vessel ischemic microangiopathy. Chronic ischemic change is seen at the basal ganglia bilaterally.  The brainstem and fourth ventricle are within normal limits. The cerebral hemispheres demonstrate grossly normal gray-white differentiation. No mass effect or midline shift is seen.  There is no evidence of fracture; visualized osseous structures are unremarkable in appearance. The visualized portions of the orbits are within normal limits. A mucus retention cyst or polyp is noted at the right maxillary sinus. The  remaining paranasal sinuses and mastoid air cells are well-aerated. Soft tissue swelling and laceration are seen at the right side of the vertex.  IMPRESSION: 1. No evidence of traumatic intracranial injury or fracture. 2. Soft tissue swelling and laceration at the right side of the vertex. 3. Moderate cortical volume loss and diffuse small vessel ischemic microangiopathy. Chronic ischemic change at the basal ganglia bilaterally. 4. Mucus retention cyst or polyp at the right maxillary sinus.   Electronically Signed   By: Garald Balding M.D.   On: 02/14/2015 02:08     EKG Interpretation None      MDM   Final diagnoses:  Fall  Scalp laceration, initial encounter    I personally performed the services described in this documentation, which was scribed in my presence. The recorded information has been reviewed and is accurate.  Pt comes in with cc of fall. Has a large laceration to the scalp. CT head is normal. C spine cleared clinically. Pt's lac was repaired, and family took him home.   Varney Biles, MD 02/14/15 [phone]

## 2015-02-14 ENCOUNTER — Encounter (HOSPITAL_COMMUNITY): Payer: Self-pay

## 2015-02-14 ENCOUNTER — Telehealth: Payer: Self-pay | Admitting: Internal Medicine

## 2015-02-14 ENCOUNTER — Emergency Department (HOSPITAL_COMMUNITY)

## 2015-02-14 MED ORDER — ACETAMINOPHEN 325 MG PO TABS
650.0000 mg | ORAL_TABLET | Freq: Once | ORAL | Status: AC
  Administered 2015-02-14: 650 mg via ORAL
  Filled 2015-02-14: qty 2

## 2015-02-14 MED ORDER — BACITRACIN ZINC 500 UNIT/GM EX OINT
1.0000 "application " | TOPICAL_OINTMENT | Freq: Two times a day (BID) | CUTANEOUS | Status: DC
  Administered 2015-02-14: 1 via TOPICAL
  Filled 2015-02-14: qty 28.35

## 2015-02-14 MED ORDER — LIDOCAINE HCL (PF) 1 % IJ SOLN
30.0000 mL | Freq: Once | INTRAMUSCULAR | Status: AC
  Administered 2015-02-14: 30 mL
  Filled 2015-02-14: qty 30

## 2015-02-14 NOTE — Telephone Encounter (Signed)
Called [facility] and spoke to [place] pt's nurse and told her discussed with Dr. Raliegh Ip and not sure what to tell you to do to keep pt from touching staples. Charise Carwin said pt is fine he only touches them when you talk about them she said he has not bothered with them all day and if you tell him not to touch he listens. Told [place] okay.

## 2015-02-14 NOTE — Telephone Encounter (Signed)
Amy Lovena Le called in regards to Howard Payne having staples in his head and he keeps touching them and risking infection. Amy would like to know what you would recommend to put on them to keep him from touching the staples. She needs an order sent over to FAX#: [phone].

## 2015-02-14 NOTE — Discharge Instructions (Signed)
We saw you in the ER after you had a fall. All the imaging results are normal, no fractures seen. No evidence of brain bleed. Please be very careful with walking, and do everything possible to prevent falls.  The laceration has been stapled. PLEASE GIVE TYLENOL 650 MG EVERY 6 HOURS AS NEEDED FOR PAIN  Please go to your Primary Care Physician, an Urgent Care or return to the Emergency Department to have your staples or sutures removed 7 -10 days from today.   Laceration Care, Adult A laceration is a cut or lesion that goes through all layers of the skin and into the tissue just beneath the skin. TREATMENT  Some lacerations may not require closure. Some lacerations may not be able to be closed due to an increased risk of infection. It is important to see your caregiver as soon as possible after an injury to minimize the risk of infection and maximize the opportunity for successful closure. If closure is appropriate, pain medicines may be given, if needed. The wound will be cleaned to help prevent infection. Your caregiver will use stitches (sutures), staples, wound glue (adhesive), or skin adhesive strips to repair the laceration. These tools bring the skin edges together to allow for faster healing and a better cosmetic outcome. However, all wounds will heal with a scar. Once the wound has healed, scarring can be minimized by covering the wound with sunscreen during the day for 1 full year. HOME CARE INSTRUCTIONS  For sutures or staples:  Keep the wound clean and dry.  If you were given a bandage (dressing), you should change it at least once a day. Also, change the dressing if it becomes wet or dirty, or as directed by your caregiver.  Wash the wound with soap and water 2 times a day. Rinse the wound off with water to remove all soap. Pat the wound dry with a clean towel.  After cleaning, apply a thin layer of the antibiotic ointment as recommended by your caregiver. This will help prevent  infection and keep the dressing from sticking.  You may shower as usual after the first 24 hours. Do not soak the wound in water until the sutures are removed.  Only take over-the-counter or prescription medicines for pain, discomfort, or fever as directed by your caregiver.  Get your sutures or staples removed as directed by your caregiver. For skin adhesive strips:  Keep the wound clean and dry.  Do not get the skin adhesive strips wet. You may bathe carefully, using caution to keep the wound dry.  If the wound gets wet, pat it dry with a clean towel.  Skin adhesive strips will fall off on their own. You may trim the strips as the wound heals. Do not remove skin adhesive strips that are still stuck to the wound. They will fall off in time. For wound adhesive:  You may briefly wet your wound in the shower or bath. Do not soak or scrub the wound. Do not swim. Avoid periods of heavy perspiration until the skin adhesive has fallen off on its own. After showering or bathing, gently pat the wound dry with a clean towel.  Do not apply liquid medicine, cream medicine, or ointment medicine to your wound while the skin adhesive is in place. This may loosen the film before your wound is healed.  If a dressing is placed over the wound, be careful not to apply tape directly over the skin adhesive. This may cause the adhesive to be  pulled off before the wound is healed.  Avoid prolonged exposure to sunlight or tanning lamps while the skin adhesive is in place. Exposure to ultraviolet light in the first year will darken the scar.  The skin adhesive will usually remain in place for 5 to 10 days, then naturally fall off the skin. Do not pick at the adhesive film. You may need a tetanus shot if:  You cannot remember when you had your last tetanus shot.  You have never had a tetanus shot. If you get a tetanus shot, your arm may swell, get red, and feel warm to the touch. This is common and not a  problem. If you need a tetanus shot and you choose not to have one, there is a rare chance of getting tetanus. Sickness from tetanus can be serious. SEEK MEDICAL CARE IF:   You have redness, swelling, or increasing pain in the wound.  You see a red line that goes away from the wound.  You have yellowish-white fluid (pus) coming from the wound.  You have a fever.  You notice a bad smell coming from the wound or dressing.  Your wound breaks open before or after sutures have been removed.  You notice something coming out of the wound such as wood or glass.  Your wound is on your hand or foot and you cannot move a finger or toe. SEEK IMMEDIATE MEDICAL CARE IF:   Your pain is not controlled with prescribed medicine.  You have severe swelling around the wound causing pain and numbness or a change in color in your arm, hand, leg, or foot.  Your wound splits open and starts bleeding.  You have worsening numbness, weakness, or loss of function of any joint around or beyond the wound.  You develop painful lumps near the wound or on the skin anywhere on your body. MAKE SURE YOU:   Understand these instructions.  Will watch your condition.  Will get help right away if you are not doing well or get worse. Document Released: 11/04/2005 Document Revised: 01/27/2012 Document Reviewed: 04/30/2011 [facility] Patient Information 2015 [place], [place]. This information is not intended to replace advice given to you by your health care provider. Make sure you discuss any questions you have with your health care provider.  Stitches, Staples, or Skin Adhesive Strips  Stitches (sutures), staples, and skin adhesive strips hold the skin together as it heals. They will usually be in place for 7 days or less. HOME CARE  Wash your hands with soap and water before and after you touch your wound.  Only take medicine as told by your doctor.  Cover your wound only if your doctor told you to. Otherwise,  leave it open to air.  Do not get your stitches wet or dirty. If they get dirty, dab them gently with a clean washcloth. Wet the washcloth with soapy water. Do not rub. Pat them dry gently.  Do not put medicine or medicated cream on your stitches unless your doctor told you to.  Do not take out your own stitches or staples. Skin adhesive strips will fall off by themselves.  Do not pick at the wound. Picking can cause an infection.  Do not miss your follow-up appointment.  If you have problems or questions, call your doctor. GET HELP RIGHT AWAY IF:   You have a temperature by mouth above 102 F (38.9 C), not controlled by medicine.  You have chills.  You have redness or pain around your  stitches.  There is puffiness (swelling) around your stitches.  You notice fluid (drainage) from your stitches.  There is a bad smell coming from your wound. MAKE SURE YOU:  Understand these instructions.  Will watch your condition.  Will get help if you are not doing well or get worse. Document Released: 09/01/2009 Document Revised: 01/27/2012 Document Reviewed: 09/01/2009 [facility] Patient Information 2015 [place], [place]. This information is not intended to replace advice given to you by your health care provider. Make sure you discuss any questions you have with your health care provider.

## 2015-02-14 NOTE — ED Notes (Signed)
MD at bedside. 

## 2015-02-21 ENCOUNTER — Ambulatory Visit (INDEPENDENT_AMBULATORY_CARE_PROVIDER_SITE_OTHER): Payer: Medicare Other | Admitting: Internal Medicine

## 2015-02-21 ENCOUNTER — Encounter: Payer: Self-pay | Admitting: Internal Medicine

## 2015-02-21 VITALS — BP 110/70 | HR 88 | Temp 98.0°F | Resp 18 | Ht 72.0 in | Wt 190.0 lb

## 2015-02-21 DIAGNOSIS — I482 Chronic atrial fibrillation, unspecified: Secondary | ICD-10-CM

## 2015-02-21 DIAGNOSIS — R4189 Other symptoms and signs involving cognitive functions and awareness: Secondary | ICD-10-CM | POA: Diagnosis not present

## 2015-02-21 DIAGNOSIS — Z4802 Encounter for removal of sutures: Secondary | ICD-10-CM | POA: Diagnosis not present

## 2015-02-21 NOTE — Patient Instructions (Signed)
Call or return to clinic prn if these symptoms worsen or fail to improve as anticipated.

## 2015-02-21 NOTE — Progress Notes (Signed)
Pre visit review using our clinic review tool, if applicable. No additional management support is needed unless otherwise documented below in the visit note. 

## 2015-02-21 NOTE — Progress Notes (Signed)
Subjective:    Patient ID: Howard Payne, male    DOB: 23-Feb-1920, 81 y.o.   MRN: [id number]  HPI  81 year old patient who is seen today for staple removal.  He fell attempting to transfer from a walker to a wheelchair, sustaining a laceration to the right parietal scalp.  He was seen in the ED for laceration repair on 02/13/2015.  He is a resident of [facility].  Past Medical History  Diagnosis Date  . Irregular heart rate   . Macular degeneration   . Cognitive impairment   . Hypotension   . 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 Glaucoma   . Atrial fibrillation   . Dysrhythmia   . GERD (gastroesophageal reflux disease)   . Dementia   . CHF (congestive heart failure)   . Pneumonia 09/05/2014    History   Social History  . Marital Status: Widowed    Spouse Name: N/A  . Number of Children: N/A  . Years of Education: N/A   Occupational History  . Not on file.   Social History Main Topics  . Smoking status: Never Smoker   . Smokeless tobacco: Never Used  . Alcohol Use: No  . Drug Use: No  . Sexual Activity: No   Other Topics Concern  . Not on file   Social History Narrative    Past Surgical History  Procedure Laterality Date  . Mohs surgery  2006, 07/06/12    Left Lower Eyelid: Invasive Squamous Cell Carcinoma  . Shave biopsy  05/14/12    Left Lower Forehead  . Skin punch biopsy  05/14/12    Mid Left Lower Eyelid  . Eye surgery      Family History  Problem Relation Age of Onset  . Skin cancer Sister     Non-melanoma  . Stroke Mother   . Stroke Father   . Diabetes Father     No Known Allergies  Current Outpatient Prescriptions on File Prior to Visit  Medication Sig Dispense Refill  . acetaminophen (TYLENOL) 325 MG tablet Take 650 mg by mouth 3 (three) times daily.    Marland Kitchen albuterol (PROVENTIL HFA;VENTOLIN HFA) 108 (90 BASE) MCG/ACT inhaler Inhale  2 puffs into the lungs every 6 (six) hours as needed for wheezing or shortness of breath.    Marland Kitchen albuterol (PROVENTIL) (2.5 MG/3ML) 0.083% nebulizer solution Take 3 mLs (2.5 mg total) by nebulization every 6 (six) hours as needed for wheezing or shortness of breath. 75 mL 12  . diclofenac sodium (VOLTAREN) 1 % GEL Apply 2 g topically 2 (two) times daily.     . digoxin (LANOXIN) 0.125 MG tablet Take 0.125 mg by mouth daily.    . diphenhydrAMINE (BENADRYL) 25 MG tablet Take 25 mg by mouth every 6 (six) hours as needed for itching.    Marland Kitchen guaiFENesin (MUCINEX) 600 MG 12 hr tablet Take 600 mg by mouth 2 (two) times daily as needed for cough or to loosen phlegm.    . latanoprost (XALATAN) 0.005 % ophthalmic solution Place 1 drop into both eyes at bedtime.    . Memantine HCl ER (NAMENDA XR) 28 MG CP24 Take 28 mg by mouth daily.    Marland Kitchen omeprazole (PRILOSEC) 20 MG capsule Take 1 capsule (20 mg total) by mouth daily. 90 capsule 3  . Polyethyl Glycol-Propyl Glycol (SYSTANE) 0.4-0.3 % GEL Place 1  application into the left eye at bedtime. On to eyelid    . polyvinyl alcohol (LIQUIFILM TEARS) 1.4 % ophthalmic solution Place 1 drop into the left eye daily as needed for dry eyes.    Marland Kitchen sertraline (ZOLOFT) 25 MG tablet Take 1 tablet (25 mg total) by mouth daily. 30 tablet 11   No current facility-administered medications on file prior to visit.    BP 110/70 mmHg  Pulse 88  Temp(Src) 98 F (36.7 C) (Oral)  Resp 18  Ht 6' (1.829 m)  Wt 190 lb (86.183 kg)  BMI 25.76 kg/m2  SpO2 96%      Review of Systems  Skin: Positive for wound.       Objective:   Physical Exam  Constitutional: He appears well-developed and well-nourished. No distress.  Wheelchair bound No distress Blood pressure normal  Skin:  Laceration nicely healed in the right parietal scalp area 4.  Staples removed without difficulty          Assessment & Plan:   Staple removal Chronic atrial fibrillation Cognitive  impairment Unsteady gait   Return when necessary or as scheduled

## 2015-02-22 ENCOUNTER — Telehealth: Payer: Self-pay | Admitting: Internal Medicine

## 2015-02-22 NOTE — Telephone Encounter (Signed)
Discussed with Dr. Raliegh Ip, he said it is up to family and Hospice if pt goes to ED and how deep the laceration is.  Called and spoke to [place], asked her how deep laceration was? She said not a laceration just an abrasion and nodule. She was just speaking with the daughter and they are going to watch for now. Told her Dr.K said it is up to family and hospice if they go to ED. [name] verbalized understanding and stated pt does have some increase in confusion but is awake more now and just went to lunch. They will keep a watch on him and decide if he needs to go to ED later. Told her okay. Dr. Raliegh Ip made aware.

## 2015-02-22 NOTE — Telephone Encounter (Signed)
Pt had another fall last night. Now has an abrasion on the left side of his forehead 2in x 1in. RN asked daughter if he needed to go to the ER and daughter is consulting with POA. RN states he is very lethargic.

## 2015-02-23 ENCOUNTER — Telehealth: Payer: Self-pay | Admitting: *Deleted

## 2015-02-23 NOTE — Telephone Encounter (Signed)
Received phone call from [place] said pt having trouble swallowing this morning and could not take medications spit them out. [name] would like order to crush all medications except Namenda and to discontinue Omeprazole. Told her that is fine, will fax over ordr

## 2015-02-23 NOTE — Telephone Encounter (Signed)
Received phone call from [place] said pt having trouble swallowing this morning and could not take medications spit them out. [name] would like order to crush all medications except Namenda and to discontinue Omeprazole. Told her that is fine, will fax over order. [name] verbalized understanding.  Discussed with Dr. Yong Channel, he said that is fine will sign order. Order done and faxed to [phone].

## 2015-04-20 ENCOUNTER — Telehealth: Payer: Self-pay | Admitting: Internal Medicine

## 2015-04-20 NOTE — Telephone Encounter (Signed)
Howard Payne from Hospice wanted to let you know they ordered Roxanol liquid 20 mg per mL, 5 mg every 4 hours PRN for SOB and pain.

## 2015-04-20 NOTE — Telephone Encounter (Signed)
FYI

## 2015-04-24 ENCOUNTER — Telehealth: Payer: Self-pay | Admitting: Internal Medicine

## 2015-04-24 MED ORDER — OMEPRAZOLE 20 MG PO CPDR: 20.0000 mg | DELAYED_RELEASE_CAPSULE | Freq: Every day | ORAL | Status: AC

## 2015-04-24 NOTE — Telephone Encounter (Signed)
ok 

## 2015-04-24 NOTE — Telephone Encounter (Signed)
[name] [phone]) called from Hospice stating that Mr. Howard Payne is having difficulty belching and hold his left side when he belches. She was wondering if he can be re-prescribed omeprazole (PRILOSEC) 20 MG capsule.

## 2015-04-24 NOTE — Telephone Encounter (Signed)
Please see message and advise 

## 2015-04-24 NOTE — Telephone Encounter (Signed)
Spoke to [place], told her Dr.K said it was fine for pt take Omeprazole 20 mg daily. Howard Payne verbalized understanding and said to fax Rx to [facility] and they will get it filled for pt. Told her okay. Rx faxed.

## 2015-05-07 ENCOUNTER — Emergency Department (HOSPITAL_COMMUNITY): Admitting: Anesthesiology

## 2015-05-07 ENCOUNTER — Encounter (HOSPITAL_COMMUNITY)
Admission: EM | Disposition: A | Discharge status: Skilled Nursing Facility | Payer: Self-pay | Source: Home / Self Care | Attending: Internal Medicine

## 2015-05-07 ENCOUNTER — Inpatient Hospital Stay (HOSPITAL_COMMUNITY)
Admission: EM | Admit: 2015-05-07 | Discharge: 2015-05-10 | DRG: 511 | Disposition: A | Discharge status: Skilled Nursing Facility | Attending: Internal Medicine | Admitting: Internal Medicine

## 2015-05-07 ENCOUNTER — Emergency Department (HOSPITAL_COMMUNITY)

## 2015-05-07 ENCOUNTER — Encounter (HOSPITAL_COMMUNITY): Payer: Self-pay

## 2015-05-07 DIAGNOSIS — F0390 Unspecified dementia without behavioral disturbance: Secondary | ICD-10-CM | POA: Diagnosis not present

## 2015-05-07 DIAGNOSIS — S42302A Unspecified fracture of shaft of humerus, left arm, initial encounter for closed fracture: Secondary | ICD-10-CM | POA: Diagnosis not present

## 2015-05-07 DIAGNOSIS — S62102D Fracture of unspecified carpal bone, left wrist, subsequent encounter for fracture with routine healing: Secondary | ICD-10-CM | POA: Diagnosis not present

## 2015-05-07 DIAGNOSIS — S62102A Fracture of unspecified carpal bone, left wrist, initial encounter for closed fracture: Secondary | ICD-10-CM | POA: Diagnosis present

## 2015-05-07 DIAGNOSIS — S42309A Unspecified fracture of shaft of humerus, unspecified arm, initial encounter for closed fracture: Secondary | ICD-10-CM | POA: Diagnosis present

## 2015-05-07 DIAGNOSIS — H3530 Unspecified macular degeneration: Secondary | ICD-10-CM | POA: Diagnosis present

## 2015-05-07 DIAGNOSIS — F329 Major depressive disorder, single episode, unspecified: Secondary | ICD-10-CM | POA: Diagnosis present

## 2015-05-07 DIAGNOSIS — I482 Chronic atrial fibrillation, unspecified: Secondary | ICD-10-CM | POA: Diagnosis present

## 2015-05-07 DIAGNOSIS — W19XXXA Unspecified fall, initial encounter: Secondary | ICD-10-CM

## 2015-05-07 DIAGNOSIS — K219 Gastro-esophageal reflux disease without esophagitis: Secondary | ICD-10-CM | POA: Diagnosis not present

## 2015-05-07 DIAGNOSIS — I5032 Chronic diastolic (congestive) heart failure: Secondary | ICD-10-CM | POA: Diagnosis present

## 2015-05-07 DIAGNOSIS — M25532 Pain in left wrist: Secondary | ICD-10-CM | POA: Diagnosis present

## 2015-05-07 DIAGNOSIS — S42202A Unspecified fracture of upper end of left humerus, initial encounter for closed fracture: Secondary | ICD-10-CM | POA: Diagnosis present

## 2015-05-07 DIAGNOSIS — S52209B Unspecified fracture of shaft of unspecified ulna, initial encounter for open fracture type I or II: Secondary | ICD-10-CM | POA: Diagnosis present

## 2015-05-07 DIAGNOSIS — W0110XA Fall on same level from slipping, tripping and stumbling with subsequent striking against unspecified object, initial encounter: Secondary | ICD-10-CM | POA: Diagnosis present

## 2015-05-07 DIAGNOSIS — I509 Heart failure, unspecified: Secondary | ICD-10-CM

## 2015-05-07 DIAGNOSIS — H409 Unspecified glaucoma: Secondary | ICD-10-CM | POA: Diagnosis present

## 2015-05-07 DIAGNOSIS — S52602B Unspecified fracture of lower end of left ulna, initial encounter for open fracture type I or II: Secondary | ICD-10-CM | POA: Diagnosis present

## 2015-05-07 DIAGNOSIS — F0391 Unspecified dementia with behavioral disturbance: Secondary | ICD-10-CM | POA: Diagnosis not present

## 2015-05-07 DIAGNOSIS — S52502B Unspecified fracture of the lower end of left radius, initial encounter for open fracture type I or II: Secondary | ICD-10-CM | POA: Diagnosis present

## 2015-05-07 DIAGNOSIS — S5290XB Unspecified fracture of unspecified forearm, initial encounter for open fracture type I or II: Secondary | ICD-10-CM

## 2015-05-07 DIAGNOSIS — S62109A Fracture of unspecified carpal bone, unspecified wrist, initial encounter for closed fracture: Secondary | ICD-10-CM | POA: Diagnosis present

## 2015-05-07 DIAGNOSIS — S42302D Unspecified fracture of shaft of humerus, left arm, subsequent encounter for fracture with routine healing: Secondary | ICD-10-CM | POA: Diagnosis not present

## 2015-05-07 DIAGNOSIS — Z66 Do not resuscitate: Secondary | ICD-10-CM | POA: Diagnosis present

## 2015-05-07 DIAGNOSIS — C44121 Squamous cell carcinoma of skin of unspecified eyelid, including canthus: Secondary | ICD-10-CM | POA: Diagnosis present

## 2015-05-07 HISTORY — PX: ORIF WRIST FRACTURE: SHX2133

## 2015-05-07 HISTORY — PX: I & D EXTREMITY: SHX5045

## 2015-05-07 LAB — DIGOXIN LEVEL: DIGOXIN LVL: 0.2 ng/mL — AB (ref 0.8–2.0)

## 2015-05-07 LAB — BASIC METABOLIC PANEL
ANION GAP: 4 — AB (ref 5–15)
BUN: 12 mg/dL (ref 6–20)
CALCIUM: 6.3 mg/dL — AB (ref 8.9–10.3)
CO2: 19 mmol/L — AB (ref 22–32)
Chloride: 115 mmol/L — ABNORMAL HIGH (ref 101–111)
Creatinine, Ser: 0.48 mg/dL — ABNORMAL LOW (ref 0.61–1.24)
GFR calc non Af Amer: >60 mL/min (ref >60)
GLUCOSE: 114 mg/dL — AB (ref 65–99)
Potassium: 3.2 mmol/L — ABNORMAL LOW (ref 3.5–5.1)
Sodium: 138 mmol/L (ref 135–145)

## 2015-05-07 LAB — CBC WITH DIFFERENTIAL/PLATELET
BASOS PCT: 0 % (ref 0–1)
Basophils Absolute: 0 10*3/uL (ref 0.0–0.1)
Eosinophils Absolute: 0.1 10*3/uL (ref 0.0–0.7)
Eosinophils Relative: 1 % (ref 0–5)
HCT: 35.8 % — ABNORMAL LOW (ref 39.0–52.0)
HEMOGLOBIN: 12.1 g/dL — AB (ref 13.0–17.0)
LYMPHS ABS: 0.7 10*3/uL (ref 0.7–4.0)
Lymphocytes Relative: 7 % — ABNORMAL LOW (ref 12–46)
MCH: 32.4 pg (ref 26.0–34.0)
MCHC: 33.8 g/dL (ref 30.0–36.0)
MCV: 95.7 fL (ref 78.0–100.0)
MONO ABS: 0.8 10*3/uL (ref 0.1–1.0)
MONOS PCT: 8 % (ref 3–12)
Neutro Abs: 8.5 10*3/uL — ABNORMAL HIGH (ref 1.7–7.7)
Neutrophils Relative %: 84 % — ABNORMAL HIGH (ref 43–77)
PLATELETS: 168 10*3/uL (ref 150–400)
RBC: 3.74 MIL/uL — AB (ref 4.22–5.81)
RDW: 13.9 % (ref 11.5–15.5)
WBC: 10.1 10*3/uL (ref 4.0–10.5)

## 2015-05-07 LAB — MAGNESIUM: Magnesium: 2.1 mg/dL (ref 1.7–2.4)

## 2015-05-07 LAB — ALBUMIN: Albumin: 3.6 g/dL (ref 3.5–5.0)

## 2015-05-07 SURGERY — OPEN REDUCTION INTERNAL FIXATION (ORIF) WRIST FRACTURE
Anesthesia: Monitor Anesthesia Care | Site: Wrist | Laterality: Left

## 2015-05-07 MED ORDER — CEFAZOLIN SODIUM-DEXTROSE 2-3 GM-% IV SOLR
INTRAVENOUS | Status: AC
  Filled 2015-05-07: qty 50

## 2015-05-07 MED ORDER — SUFENTANIL CITRATE 50 MCG/ML IV SOLN
INTRAVENOUS | Status: AC
  Filled 2015-05-07: qty 1

## 2015-05-07 MED ORDER — MEPERIDINE HCL 25 MG/ML IJ SOLN: 6.2500 mg | INTRAMUSCULAR | Status: DC | PRN

## 2015-05-07 MED ORDER — FENTANYL CITRATE (PF) 100 MCG/2ML IJ SOLN
25.0000 ug | Freq: Once | INTRAMUSCULAR | Status: AC
  Administered 2015-05-07: 25 ug via INTRAVENOUS
  Filled 2015-05-07: qty 2

## 2015-05-07 MED ORDER — CEFAZOLIN SODIUM 1-5 GM-% IV SOLN
1.0000 g | INTRAVENOUS | Status: AC
Start: 2015-05-07 — End: 2015-05-07
  Administered 2015-05-07: 1 g via INTRAVENOUS

## 2015-05-07 MED ORDER — TETANUS-DIPHTH-ACELL PERTUSSIS 5-2.5-18.5 LF-MCG/0.5 IM SUSP
0.5000 mL | Freq: Once | INTRAMUSCULAR | Status: AC
  Administered 2015-05-07: 0.5 mL via INTRAMUSCULAR
  Filled 2015-05-07: qty 0.5

## 2015-05-07 MED ORDER — LACTATED RINGERS IV SOLN
INTRAVENOUS | Status: DC | PRN
  Administered 2015-05-07: 06:00:00 via INTRAVENOUS

## 2015-05-07 MED ORDER — LIDOCAINE HCL (CARDIAC) 20 MG/ML IV SOLN
INTRAVENOUS | Status: DC | PRN
  Administered 2015-05-07: 50 mg via INTRAVENOUS

## 2015-05-07 MED ORDER — ONDANSETRON HCL 4 MG PO TABS: 4.0000 mg | ORAL_TABLET | Freq: Four times a day (QID) | ORAL | Status: DC | PRN

## 2015-05-07 MED ORDER — GENTAMICIN SULFATE 40 MG/ML IJ SOLN
520.0000 mg | INTRAVENOUS | Status: DC
  Administered 2015-05-07: 520 mg via INTRAVENOUS
  Filled 2015-05-07 (×2): qty 13

## 2015-05-07 MED ORDER — DIGOXIN 125 MCG PO TABS
0.1250 mg | ORAL_TABLET | Freq: Every day | ORAL | Status: DC
  Administered 2015-05-07 – 2015-05-10 (×4): 0.125 mg via ORAL
  Filled 2015-05-07 (×4): qty 1

## 2015-05-07 MED ORDER — POLYVINYL ALCOHOL 1.4 % OP SOLN
1.0000 [drp] | Freq: Every day | OPHTHALMIC | Status: DC | PRN
  Filled 2015-05-07: qty 15

## 2015-05-07 MED ORDER — MAGNESIUM CITRATE PO SOLN: 1.0000 | Freq: Once | ORAL | Status: AC | PRN

## 2015-05-07 MED ORDER — SUFENTANIL CITRATE 50 MCG/ML IV SOLN
INTRAVENOUS | Status: DC | PRN
  Administered 2015-05-07 (×2): 5 ug via INTRAVENOUS

## 2015-05-07 MED ORDER — BUPIVACAINE-EPINEPHRINE (PF) 0.5% -1:200000 IJ SOLN
INTRAMUSCULAR | Status: DC | PRN
  Administered 2015-05-07: 25 mL via PERINEURAL

## 2015-05-07 MED ORDER — ONDANSETRON HCL 4 MG/2ML IJ SOLN: 4.0000 mg | Freq: Four times a day (QID) | INTRAMUSCULAR | Status: DC | PRN

## 2015-05-07 MED ORDER — SERTRALINE HCL 25 MG PO TABS
25.0000 mg | ORAL_TABLET | Freq: Every day | ORAL | Status: DC
  Administered 2015-05-07 – 2015-05-10 (×4): 25 mg via ORAL
  Filled 2015-05-07 (×4): qty 1

## 2015-05-07 MED ORDER — PROPOFOL 10 MG/ML IV BOLUS
INTRAVENOUS | Status: DC | PRN
  Administered 2015-05-07: 70 mg via INTRAVENOUS

## 2015-05-07 MED ORDER — POLYETHYL GLYCOL-PROPYL GLYCOL 0.4-0.3 % OP GEL: 1.0000 "application " | Freq: Every day | OPHTHALMIC | Status: DC

## 2015-05-07 MED ORDER — ENOXAPARIN SODIUM 40 MG/0.4ML ~~LOC~~ SOLN
40.0000 mg | SUBCUTANEOUS | Status: DC
  Administered 2015-05-08 – 2015-05-10 (×3): 40 mg via SUBCUTANEOUS
  Filled 2015-05-07 (×3): qty 0.4

## 2015-05-07 MED ORDER — MEMANTINE HCL ER 28 MG PO CP24
28.0000 mg | ORAL_CAPSULE | Freq: Every day | ORAL | Status: DC
  Administered 2015-05-07 – 2015-05-10 (×4): 28 mg via ORAL
  Filled 2015-05-07 (×4): qty 1

## 2015-05-07 MED ORDER — PANTOPRAZOLE SODIUM 40 MG PO TBEC
40.0000 mg | DELAYED_RELEASE_TABLET | Freq: Every day | ORAL | Status: DC
  Administered 2015-05-08 – 2015-05-10 (×3): 40 mg via ORAL
  Filled 2015-05-07 (×3): qty 1

## 2015-05-07 MED ORDER — SODIUM CHLORIDE 0.9 % IR SOLN
Status: DC | PRN
  Administered 2015-05-07: 3000 mL

## 2015-05-07 MED ORDER — POLYVINYL ALCOHOL 1.4 % OP SOLN
1.0000 [drp] | Freq: Every day | OPHTHALMIC | Status: DC
  Administered 2015-05-07 – 2015-05-09 (×3): 1 [drp] via OPHTHALMIC
  Filled 2015-05-07: qty 15

## 2015-05-07 MED ORDER — SENNA 8.6 MG PO TABS
1.0000 | ORAL_TABLET | Freq: Two times a day (BID) | ORAL | Status: DC
  Administered 2015-05-07 – 2015-05-10 (×6): 8.6 mg via ORAL
  Filled 2015-05-07 (×6): qty 1

## 2015-05-07 MED ORDER — FENTANYL CITRATE (PF) 100 MCG/2ML IJ SOLN: 25.0000 ug | INTRAMUSCULAR | Status: DC | PRN

## 2015-05-07 MED ORDER — CEFAZOLIN SODIUM 1-5 GM-% IV SOLN
INTRAVENOUS | Status: AC
  Filled 2015-05-07: qty 50

## 2015-05-07 MED ORDER — LATANOPROST 0.005 % OP SOLN
1.0000 [drp] | Freq: Every day | OPHTHALMIC | Status: DC
  Administered 2015-05-07 – 2015-05-09 (×3): 1 [drp] via OPHTHALMIC
  Filled 2015-05-07: qty 2.5

## 2015-05-07 MED ORDER — PHENYLEPHRINE HCL 10 MG/ML IJ SOLN
10.0000 mg | INTRAVENOUS | Status: DC | PRN
  Administered 2015-05-07: 40 ug/min via INTRAVENOUS

## 2015-05-07 MED ORDER — ALBUTEROL SULFATE (2.5 MG/3ML) 0.083% IN NEBU: 2.5000 mg | INHALATION_SOLUTION | Freq: Four times a day (QID) | RESPIRATORY_TRACT | Status: DC | PRN

## 2015-05-07 MED ORDER — CEFAZOLIN SODIUM-DEXTROSE 2-3 GM-% IV SOLR
INTRAVENOUS | Status: DC | PRN
Start: 2015-05-07 — End: 2015-05-07
  Administered 2015-05-07: 2 g via INTRAVENOUS

## 2015-05-07 MED ORDER — CEFAZOLIN SODIUM-DEXTROSE 2-3 GM-% IV SOLR
2.0000 g | Freq: Once | INTRAVENOUS | Status: AC
  Administered 2015-05-07: 2 g via INTRAVENOUS
  Filled 2015-05-07: qty 50

## 2015-05-07 MED ORDER — ACETAMINOPHEN 325 MG PO TABS: 650.0000 mg | ORAL_TABLET | Freq: Four times a day (QID) | ORAL | Status: DC | PRN

## 2015-05-07 MED ORDER — OXYCODONE HCL 5 MG PO TABS
5.0000 mg | ORAL_TABLET | ORAL | Status: DC | PRN
  Administered 2015-05-07 – 2015-05-08 (×3): 5 mg via ORAL
  Administered 2015-05-08: 10 mg via ORAL
  Administered 2015-05-08 – 2015-05-09 (×2): 5 mg via ORAL
  Administered 2015-05-10: 10 mg via ORAL
  Filled 2015-05-07 (×5): qty 1
  Filled 2015-05-07 (×2): qty 2
  Filled 2015-05-07: qty 1

## 2015-05-07 MED ORDER — SUCCINYLCHOLINE CHLORIDE 20 MG/ML IJ SOLN
INTRAMUSCULAR | Status: DC | PRN
  Administered 2015-05-07: 120 mg via INTRAVENOUS

## 2015-05-07 MED ORDER — 0.9 % SODIUM CHLORIDE (POUR BTL) OPTIME
TOPICAL | Status: DC | PRN
  Administered 2015-05-07: 1000 mL

## 2015-05-07 MED ORDER — CEFAZOLIN SODIUM 1-5 GM-% IV SOLN
1.0000 g | Freq: Three times a day (TID) | INTRAVENOUS | Status: DC
  Administered 2015-05-07 – 2015-05-10 (×9): 1 g via INTRAVENOUS
  Filled 2015-05-07 (×11): qty 50

## 2015-05-07 SURGICAL SUPPLY — 76 items
BANDAGE ELASTIC 3 VELCRO ST LF (GAUZE/BANDAGES/DRESSINGS) ×3 IMPLANT
BANDAGE ELASTIC 4 VELCRO ST LF (GAUZE/BANDAGES/DRESSINGS) ×3 IMPLANT
BLADE SURG ROTATE 9660 (MISCELLANEOUS) IMPLANT
BNDG CONFORM 2 STRL LF (GAUZE/BANDAGES/DRESSINGS) IMPLANT
BNDG ESMARK 4X9 LF (GAUZE/BANDAGES/DRESSINGS) ×3 IMPLANT
BNDG GAUZE ELAST 4 BULKY (GAUZE/BANDAGES/DRESSINGS) ×3 IMPLANT
CORDS BIPOLAR (ELECTRODE) ×3 IMPLANT
COVER SURGICAL LIGHT HANDLE (MISCELLANEOUS) ×3 IMPLANT
CUFF TOURNIQUET SINGLE 18IN (TOURNIQUET CUFF) ×3 IMPLANT
CUFF TOURNIQUET SINGLE 24IN (TOURNIQUET CUFF) IMPLANT
DRAIN TLS ROUND 10FR (DRAIN) IMPLANT
DRAPE OEC MINIVIEW 54X84 (DRAPES) ×3 IMPLANT
DRAPE SURG 17X23 STRL (DRAPES) ×3 IMPLANT
DRSG ADAPTIC 3X8 NADH LF (GAUZE/BANDAGES/DRESSINGS) ×3 IMPLANT
ELECT REM PT RETURN 9FT ADLT (ELECTROSURGICAL) ×3
ELECTRODE REM PT RTRN 9FT ADLT (ELECTROSURGICAL) ×1 IMPLANT
GAUZE SPONGE 4X4 12PLY STRL (GAUZE/BANDAGES/DRESSINGS) ×3 IMPLANT
GAUZE XEROFORM 1X8 LF (GAUZE/BANDAGES/DRESSINGS) IMPLANT
GAUZE XEROFORM 5X9 LF (GAUZE/BANDAGES/DRESSINGS) ×3 IMPLANT
GLOVE BIOGEL M STRL SZ7.5 (GLOVE) ×3 IMPLANT
GLOVE SS BIOGEL STRL SZ 8 (GLOVE) ×2 IMPLANT
GLOVE SUPERSENSE BIOGEL SZ 8 (GLOVE) ×4
GOWN STRL REUS W/ TWL LRG LVL3 (GOWN DISPOSABLE) ×3 IMPLANT
GOWN STRL REUS W/ TWL XL LVL3 (GOWN DISPOSABLE) ×3 IMPLANT
GOWN STRL REUS W/TWL LRG LVL3 (GOWN DISPOSABLE) ×6
GOWN STRL REUS W/TWL XL LVL3 (GOWN DISPOSABLE) ×6
HANDPIECE INTERPULSE COAX TIP (DISPOSABLE)
K-WIRE ×3 IMPLANT
KIT BASIN OR (CUSTOM PROCEDURE TRAY) ×3 IMPLANT
KIT ROOM TURNOVER OR (KITS) ×3 IMPLANT
LOOP VESSEL MAXI BLUE (MISCELLANEOUS) IMPLANT
MANIFOLD NEPTUNE II (INSTRUMENTS) ×6 IMPLANT
NEEDLE 22X1 1/2 (OR ONLY) (NEEDLE) IMPLANT
NEEDLE HYPO 25GX1X1/2 BEV (NEEDLE) IMPLANT
NS IRRIG 1000ML POUR BTL (IV SOLUTION) ×3 IMPLANT
PACK ORTHO EXTREMITY (CUSTOM PROCEDURE TRAY) ×3 IMPLANT
PAD ARMBOARD 7.5X6 YLW CONV (MISCELLANEOUS) ×6 IMPLANT
PAD CAST 3X4 CTTN HI CHSV (CAST SUPPLIES) IMPLANT
PAD CAST 4YDX4 CTTN HI CHSV (CAST SUPPLIES) IMPLANT
PADDING CAST ABS 3INX4YD NS (CAST SUPPLIES) ×2
PADDING CAST ABS 4INX4YD NS (CAST SUPPLIES) ×2
PADDING CAST ABS COTTON 3X4 (CAST SUPPLIES) ×1 IMPLANT
PADDING CAST ABS COTTON 4X4 ST (CAST SUPPLIES) ×1 IMPLANT
PADDING CAST COTTON 3X4 STRL (CAST SUPPLIES)
PADDING CAST COTTON 4X4 STRL (CAST SUPPLIES)
PEG LOCKING SMOOTH 2.2X20 (Screw) ×3 IMPLANT
PEG LOCKING SMOOTH 2.2X24 (Peg) ×9 IMPLANT
PLATE LONG DVR LEFT (Plate) ×3 IMPLANT
PUTTY DBM STAGRAFT PLUS 5CC (Putty) ×3 IMPLANT
SCREW LOCK 16X2.7X 3 LD TPR (Screw) ×5 IMPLANT
SCREW LOCK 20X2.7X 3 LD TPR (Screw) ×1 IMPLANT
SCREW LOCKING 2.7X15MM (Screw) ×3 IMPLANT
SCREW LOCKING 2.7X16 (Screw) ×10 IMPLANT
SCREW LOCKING 2.7X20MM (Screw) ×2 IMPLANT
SET CYSTO W/LG BORE CLAMP LF (SET/KITS/TRAYS/PACK) ×3 IMPLANT
SET HNDPC FAN SPRY TIP SCT (DISPOSABLE) IMPLANT
SLING ARM FOAM STRAP XLG (SOFTGOODS) ×3 IMPLANT
SPLINT FIBERGLASS 4X30 (CAST SUPPLIES) ×3 IMPLANT
SPONGE LAP 18X18 X RAY DECT (DISPOSABLE) IMPLANT
SPONGE LAP 4X18 X RAY DECT (DISPOSABLE) IMPLANT
SUT MNCRL AB 4-0 PS2 18 (SUTURE) IMPLANT
SUT PROLENE 3 0 PS 2 (SUTURE) ×9 IMPLANT
SUT PROLENE 4 0 PS 2 18 (SUTURE) ×3 IMPLANT
SUT VIC AB 3-0 FS2 27 (SUTURE) IMPLANT
SUT VICRYL 4-0 PS2 18IN ABS (SUTURE) ×3 IMPLANT
SYR CONTROL 10ML LL (SYRINGE) IMPLANT
SYSTEM CHEST DRAIN TLS 7FR (DRAIN) ×3 IMPLANT
TOWEL OR 17X24 6PK STRL BLUE (TOWEL DISPOSABLE) ×3 IMPLANT
TOWEL OR 17X26 10 PK STRL BLUE (TOWEL DISPOSABLE) ×3 IMPLANT
TUBE ANAEROBIC SPECIMEN COL (MISCELLANEOUS) IMPLANT
TUBE CONNECTING 12'X1/4 (SUCTIONS) ×1
TUBE CONNECTING 12X1/4 (SUCTIONS) ×2 IMPLANT
TUBE EVACUATION TLS (MISCELLANEOUS) ×6 IMPLANT
WATER STERILE IRR 1000ML POUR (IV SOLUTION) ×3 IMPLANT
YANKAUER SUCT BULB TIP NO VENT (SUCTIONS) IMPLANT
k-wire (0.62") 1.6mm x 102mm (Wire) ×3 IMPLANT

## 2015-05-07 NOTE — Anesthesia Postprocedure Evaluation (Signed)
  Anesthesia Post-op Note  Patient: Howard Payne  Procedure(s) Performed: Procedure(s): OPEN REDUCTION INTERNAL FIXATION (ORIF) WRIST FRACTURE (Left) IRRIGATION AND DEBRIDEMENT LEFT WRIST (Left)  Patient Location: PACU  Anesthesia Type:General  Level of Consciousness: awake  Airway and Oxygen Therapy: Patient Spontanous Breathing  Post-op Pain: mild  Post-op Assessment: Post-op Vital signs reviewed              Post-op Vital Signs: Reviewed  Last Vitals:  Filed Vitals:   05/07/15 1015  BP: 129/80  Pulse:   Temp: 36.8 C  Resp:     Complications: No apparent anesthesia complications

## 2015-05-07 NOTE — Transfer of Care (Signed)
Immediate Anesthesia Transfer of Care Note  Patient: Howard Payne  Procedure(s) Performed: Procedure(s): OPEN REDUCTION INTERNAL FIXATION (ORIF) WRIST FRACTURE (Left) IRRIGATION AND DEBRIDEMENT LEFT WRIST (Left)  Patient Location: PACU  Anesthesia Type:General  Level of Consciousness: sedated  Airway & Oxygen Therapy: Patient Spontanous Breathing and Patient connected to nasal cannula oxygen  Post-op Assessment: Report given to RN and Post -op Vital signs reviewed and stable  Post vital signs: Reviewed and stable  Last Vitals:  Filed Vitals:   05/07/15 0545  BP: 130/87  Pulse: 96  Temp:   Resp: 18    Complications: No apparent anesthesia complications

## 2015-05-07 NOTE — Progress Notes (Addendum)
ANTIBIOTIC CONSULT NOTE - INITIAL  Pharmacy Consult for Gentamicin Indication: post-op prophylaxis  No Known Allergies  Patient Measurements: Height: 6\' 1"  (185.4 cm) Weight: 190 lb (86.183 kg) IBW/kg (Calculated) : 79.9  Vital Signs: Temp: 98.2 F (36.8 C) (06/19 1015) BP: 129/80 mmHg (06/19 1015) Pulse Rate: 86 (06/19 0930) Intake/Output from previous day:   Intake/Output from this shift: Total I/O In: 600 [I.V.:600] Out: 30 [Blood:30]  Labs:  Recent Labs  05/07/15 0235  WBC 10.1  HGB 12.1*  PLT 168  CREATININE 0.48*   Estimated Creatinine Clearance: 66.6 mL/min (by C-G formula based on Cr of 0.48). No results for input(s): VANCOTROUGH, VANCOPEAK, VANCORANDOM, GENTTROUGH, GENTPEAK, GENTRANDOM, TOBRATROUGH, TOBRAPEAK, TOBRARND, AMIKACINPEAK, AMIKACINTROU, AMIKACIN in the last 72 hours.   Microbiology: No results found for this or any previous visit (from the past 720 hour(s)).  Medical History: Past Medical History  Diagnosis Date  . Irregular heart rate   . Macular degeneration   . Cognitive impairment   . Hypotension   . 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 Glaucoma   . Atrial fibrillation   . Dysrhythmia   . GERD (gastroesophageal reflux disease)   . Dementia   . CHF (congestive heart failure)   . Pneumonia 09/05/2014    Medications:  Scheduled:  . ceFAZolin      .  ceFAZolin (ANCEF) IV  1 g Intravenous 3 times per day  . digoxin  0.125 mg Oral Daily  . [START ON 05/08/2015] enoxaparin (LOVENOX) injection  40 mg Subcutaneous Q24H  . latanoprost  1 drop Both Eyes QHS  . memantine  28 mg Oral Daily  . pantoprazole  40 mg Oral Daily  . Polyethyl Glycol-Propyl Glycol  1 application Left Eye QHS  . senna  1 tablet Oral BID  . sertraline  25 mg Oral Daily   Assessment: 81 yo M who reportedly fell last evening and suffered a L wrist and humerus  fracture.  Pt went to the OR for repair 6/19 AM.  Pt is to start Ancef and [facility] for post-op prophylaxis.  CrCl ~ 60 ml/min.  Goal of Therapy:  Approriate dosing per [place] Nomogram  Plan:  Gentamicin 520mg  IV q24h Check random gentamicin level following first dose and adjust frequency per [place] nomogram if needed.  [employer], Pharm.D., BCPS Clinical Pharmacist Pager [phone] 05/07/2015 11:23 AM    ADDENDUM: Random gent level drawn 6.5hr after dose given resulted as 10, which translates to requiring Q36H dosing.  Will change dosing interval and f/u LOT. Wynona Neat, PharmD, BCPS 05/08/2015 12:28 AM

## 2015-05-07 NOTE — ED Notes (Signed)
OR called to say ready for pt upstairs

## 2015-05-07 NOTE — Progress Notes (Signed)
Inpatient [place] [facility] [place] [facility] RN [place] RN  Patient lying in bed leaning against the right bed railing. Pt. Responds to questions but some answers are vague. His untouched lunch tray is on the table in front of him. His left arm rests on a pillow with an ice pack on top of his arm. He is unable to state if he wants to try the lunch before him. Staff nurse Chrys Racer asked about swallowing problems and [facility] Liaison advised that he does have dysphagia . Patient called out for help after this nurse left the room. Patient states he has wet the bed and needs help with his left arm. Pt's RN and HA advised.Pt's daughter was present earlier and left so there are no family members present.  [facility] will continue to follow daily. Please call with any concerns related to hospice.  Medication list and transfer summary placed on shadow chart.  [facility] Liaison [phone]

## 2015-05-07 NOTE — Op Note (Signed)
See dictation#[id number] Amedeo Plenty MD

## 2015-05-07 NOTE — ED Notes (Signed)
Pt comes from [facility] via [facility], pt had a fall tonight, denies neck and back pain, swelling of left wrist and deformity to left shoulder. Skin tear to left palm of hand. No blood thinners.

## 2015-05-07 NOTE — Anesthesia Preprocedure Evaluation (Addendum)
Anesthesia Evaluation  Patient identified by MRN, date of birth, ID band Patient awake and Patient confused    Reviewed: Allergy & Precautions, NPO status , Patient's Chart, lab work & pertinent test results  Airway Mallampati: I  TM Distance: >3 FB Neck ROM: Full    Dental  (+) Teeth Intact, Dental Advisory Given   Pulmonary pneumonia -, resolved,  breath sounds clear to auscultation        Cardiovascular +CHF Rhythm:Regular Rate:Normal     Neuro/Psych    GI/Hepatic GERD-  ,  Endo/Other    Renal/GU      Musculoskeletal   Abdominal   Peds  Hematology   Anesthesia Other Findings   Reproductive/Obstetrics                             Anesthesia Physical Anesthesia Plan  ASA: IV and emergent  Anesthesia Plan: MAC and Regional   Post-op Pain Management:    Induction: Intravenous  Airway Management Planned: Simple Face Mask and LMA  Additional Equipment:   Intra-op Plan:   Post-operative Plan: Extubation in OR  Informed Consent: I have reviewed the patients History and Physical, chart, labs and discussed the procedure including the risks, benefits and alternatives for the proposed anesthesia with the patient or authorized representative who has indicated his/her understanding and acceptance.   Dental advisory given  Plan Discussed with: Anesthesiologist, CRNA and Surgeon  Anesthesia Plan Comments: (Pt very demented; all hx through daughter.)        Anesthesia Quick Evaluation

## 2015-05-07 NOTE — Consult Note (Signed)
Reason for Consult: Open radius fracture left wrist Referring Physician: ER staff  Howard Payne is an 81 y.o. male.  HPI: Patient is 81 year old male who presents for evaluation. He unfortunately tripped tonight and sustained a significant open fracture to his radius. He arrived sometime after May 1 AM to the emergency room. At 4:30 AM he received 2 g of Ancef. I was called around 5 AM or so for evaluation. I've immediately came to the ER to evaluate the patient  He complains of left wrist pain. He denies any significant shoulder pain. He is alert and appropriate and a delightful gentleman. He is here today with his daughter.  His son is his power of attorney however his son is sleeping and multiple attempts to call his son have been unsuccessful. The daughter does feel very comfortable with the decision making I've spent greater than ample amounts of time with her.  He has an obvious open fracture to his left wrist. He has a closed left shoulder/proximal humerus fracture. He denies neck back chest or abdominal pain.  Past Medical History  Diagnosis Date  . Irregular heart rate   . Macular degeneration   . Cognitive impairment   . Hypotension   . 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 Glaucoma   . Atrial fibrillation   . Dysrhythmia   . GERD (gastroesophageal reflux disease)   . Dementia   . CHF (congestive heart failure)   . Pneumonia 09/05/2014    Past Surgical History  Procedure Laterality Date  . Mohs surgery  2006, 07/06/12    Left Lower Eyelid: Invasive Squamous Cell Carcinoma  . Shave biopsy  05/14/12    Left Lower Forehead  . Skin punch biopsy  05/14/12    Mid Left Lower Eyelid  . Eye surgery      Family History  Problem Relation Age of Onset  . Skin cancer Sister     Non-melanoma  . Stroke Mother   . Stroke Father   . Diabetes Father     Social History:   reports that he has never smoked. He has never used smokeless tobacco. He reports that he does not drink alcohol or use illicit drugs.  Allergies: No Known Allergies  Medications: I have reviewed the patient's current medications.  Results for orders placed or performed during the hospital encounter of 05/07/15 (from the past 48 hour(s))  CBC with Differential/Platelet     Status: Abnormal   Collection Time: 05/07/15  2:35 AM  Result Value Ref Range   WBC 10.1 4.0 - 10.5 K/uL   RBC 3.74 (L) 4.22 - 5.81 MIL/uL   Hemoglobin 12.1 (L) 13.0 - 17.0 g/dL   HCT 35.8 (L) 39.0 - 52.0 %   MCV 95.7 78.0 - 100.0 fL   MCH 32.4 26.0 - 34.0 pg   MCHC 33.8 30.0 - 36.0 g/dL   RDW 13.9 11.5 - 15.5 %   Platelets 168 150 - 400 K/uL   Neutrophils Relative % 84 (H) 43 - 77 %   Neutro Abs 8.5 (H) 1.7 - 7.7 K/uL   Lymphocytes Relative 7 (L) 12 - 46 %   Lymphs Abs 0.7 0.7 - 4.0 K/uL   Monocytes Relative 8 3 - 12 %   Monocytes Absolute 0.8 0.1 - 1.0 K/uL   Eosinophils Relative 1 0 - 5 %   Eosinophils  Absolute 0.1 0.0 - 0.7 K/uL   Basophils Relative 0 0 - 1 %   Basophils Absolute 0.0 0.0 - 0.1 K/uL  Basic metabolic panel     Status: Abnormal   Collection Time: 05/07/15  2:35 AM  Result Value Ref Range   Sodium 138 135 - 145 mmol/L   Potassium 3.2 (L) 3.5 - 5.1 mmol/L   Chloride 115 (H) 101 - 111 mmol/L   CO2 19 (L) 22 - 32 mmol/L   Glucose, Bld 114 (H) 65 - 99 mg/dL   BUN 12 6 - 20 mg/dL   Creatinine, Ser 0.48 (L) 0.61 - 1.24 mg/dL   Calcium 6.3 (LL) 8.9 - 10.3 mg/dL    Comment: REPEATED TO VERIFY CRITICAL RESULT CALLED TO, READ BACK BY AND VERIFIED WITH: Marylyn Ishihara [id number] WILDERK    GFR calc non Af Amer >60 >60 mL/min   GFR calc Af Amer >60 >60 mL/min    Comment: (NOTE) The eGFR has been calculated using the CKD EPI equation. This calculation has not been validated in all clinical situations. eGFR's persistently <60 mL/min signify possible Chronic Kidney Disease.    Anion gap 4 (L) 5  - 15    Dg Chest 1 View  05/07/2015   CLINICAL DATA:  Fall tonight.  EXAM: CHEST  1 VIEW  COMPARISON:  09/03/2014  FINDINGS: Cardiac enlargement without vascular congestion. Mild linear fibrosis or atelectasis in the lung bases, improved since previous study. No focal airspace disease. No blunting of costophrenic angles. No pneumothorax. Calcified and tortuous aorta. Fractures noted of the proximal left humerus, incompletely included on with view.  IMPRESSION: Cardiac enlargement. No vascular congestion or edema. Slight fibrosis in the lung bases.   Electronically Signed   By: Lucienne Capers M.D.   On: 05/07/2015 03:21   Dg Elbow 2 Views Left  05/07/2015   CLINICAL DATA:  Left arm pain after a fall.  EXAM: LEFT ELBOW - 2 VIEW  COMPARISON:  None.  FINDINGS: Limited two view study with oblique cross-table lateral. No gross evidence of acute fracture or dislocation of the left elbow. Due to obliquity of the lateral, cannot assess for effusions.  IMPRESSION: No obvious fracture deformity demonstrated on limited views.   Electronically Signed   By: Lucienne Capers M.D.   On: 05/07/2015 03:20   Dg Wrist 2 Views Left  05/07/2015   CLINICAL DATA:  Fall tonight.  Left arm pain.  Visible deformities.  EXAM: LEFT WRIST - 2 VIEW  COMPARISON:  None.  FINDINGS: Limited two view examination. Comminuted fractures of the distal left radial and ulnar metaphysis. Displacement of multiple butterfly fragments. Impaction of fracture fragments. Fracture lines likely extend to the articular and radial ulnar surfaces. Soft tissue swelling.  IMPRESSION: Multiple comminuted fractures of the distal left radial and ulnar metaphysis.   Electronically Signed   By: Lucienne Capers M.D.   On: 05/07/2015 03:18   Dg Shoulder Left  05/07/2015   CLINICAL DATA:  Left arm pain.  Fall tonight.  EXAM: LEFT SHOULDER - 2+ VIEW  COMPARISON:  None.  FINDINGS: Multiple comminuted fractures of the proximal meta diaphysis of the left humerus.  There is lateral displacement of distal fracture fragments. No evidence of dislocation of the left shoulder. Acromioclavicular and coracoclavicular spaces are maintained. Degenerative changes in the spine.  IMPRESSION: Comminuted fractures of the proximal left humerus.   Electronically Signed   By: Lucienne Capers M.D.   On: 05/07/2015 03:19  Review of Systems  Constitutional: Negative.   Eyes:       Patient has difficulty seeing due to medical issues  Cardiovascular:       Significant congestive heart failure for which she is under hospice care  Genitourinary: Negative.   Skin: Negative.   Neurological: Negative.   Psychiatric/Behavioral: Negative.    Blood pressure 130/87, pulse 96, temperature 98.9 F (37.2 C), resp. rate 18, height 6' 1"  (1.854 m), weight 86.183 kg (190 lb), SpO2 98 %. Physical Exam patient is alert. He has poor vision. His neck and anterior soft tissues about the neck are nontender. He has equal chest expansion and no real shortness of breath. His abdomen is nontender. His right upper extremity has IV access no bony abnormalities no palpable deformity. He has full sensation to the right hand. Lower extremity examination is without signs of obvious infection vascular compromise or compartment syndrome. He has the ability to lift the right leg and the left leg.  His left shoulder has mild swelling no evidence of compartment syndrome infection or instability however I did not stress test the shoulder.  His left wrist his highly comminuted and has open puncture wounds boldly where he sustained an open fracture. Unfortunately his x-rays reveal a very comminuted radius and ulna fracture at the wrist. This is markedly displaced.  I reviewed the x-rays of his shoulders well which show a proximal humerus fracture about the shoulder region.  I have reviewed all issues at length. His elbow films are negative.  He denies lower back pain. He has no evidence of shortness of  breath I should note.  Assessment/Plan: Open left distal radius and ulna fractures. Congestive heart failure Closed left proximal humerus fracture  I would recommend multiple options. We had a long discussion with the family. At present time given the open nature of his fracture we would certainly offer irrigation debridement and ORIF to stabilize the fracture as best as possible. We have discussed with the family risk and benefits of this type endeavor. I discussed with the family that if they desire no surgery whatsoever one could consider admission, IV antibiotics, observation.  However given all issues and the open nature of his fracture I would certainly consider an lean towards irrigation debridement and ORIF of his radius and or ulna as necessary. I discussed with them this would hopefully prevent infection and give him a stable wrist for as long as humanly possible. I discussed with him that certainly the shoulder could be left alone and hopefully will heal with closed measures. Oftentimes proximal humerus fractures will be amenable to closed treatment I've rhythms. Given his sedentary lifestyle this would fit well with his him.  After long and detailed discussion the family To move forward with irrigation debridement and ORIF of his radius and ulna as necessary. I discussed with the patient and his family all risk and benefits to include bleeding anesthesia possibly death possibly worsening congestive heart failure and contributing to his downward decline etc. with this in mind we will move 4.  I discussed this with anesthesia. Dr. Al Corpus.  We are planning surgery for your upper extremity. The risk and benefits of surgery to include risk of bleeding, infection, anesthesia,  damage to normal structures and failure of the surgery to accomplish its intended goals of relieving symptoms and restoring function have been discussed in detail. With this in mind we plan to proceed. I have specifically  discussed with the patient the pre-and postoperative regime and  the dos and don'ts and risk and benefits in great detail. Risk and benefits of surgery also include risk of dystrophy(CRPS), chronic nerve pain, failure of the healing process to go onto completion and other inherent risks of surgery The relavent the pathophysiology of the disease/injury process, as well as the alternatives for treatment and postoperative course of action has been discussed in great detail with the patient who desires to proceed.  We will do everything in our power to help you (the patient) restore function to the upper extremity. It is a pleasure to see this patient today.     Paulene Floor 05/07/2015, 5:57 AM

## 2015-05-07 NOTE — H&P (Addendum)
[facility] History and Physical  Howard Payne WKM:[id number] DOB: 22-Sep-1920 DOA: 05/07/2015  Referring physician: EDP PCP: Nyoka Cowden, MD   Chief Complaint: fall, wrist pain  HPI: Howard Payne is a 81 y.o. male currently a resident of ALF with PMH of mild Dementia, Afib not on anticoagulation at this time, Chronic diastolic CHF, followed by Hospice presents after a fall, pt reported that his feet slipped and fell over, subsequently started experiencing L wrist pain and was sent to the ER. No h/o LOC, no chest pain, dizziness or palpitations per EDP Per his PCP's note; primarily comfort care at this time. In ER, was found to have a Left wrist and humerus fracture, seen by Dr.Gramig in the ER and after discussion with daughter, taken to the OR for irrigation debridement and ORIF of his radius and ulna. Seen in PACU, drowsy at this time, unable to provide any meaningful history  Review of Systems: unable to obtain due to post op sedation Constitutional:  No weight loss, night sweats, Fevers, chills, fatigue.  HEENT:  No headaches, Difficulty swallowing,Tooth/dental problems,Sore throat,  No sneezing, itching, ear ache, nasal congestion, post nasal drip,  Cardio-vascular:  No chest pain, Orthopnea, PND, swelling in lower extremities, anasarca, dizziness, palpitations  GI:  No heartburn, indigestion, abdominal pain, nausea, vomiting, diarrhea, change in bowel habits, loss of appetite  Resp:  No shortness of breath with exertion or at rest. No excess mucus, no productive cough, No non-productive cough, No coughing up of blood.No change in color of mucus.No wheezing.No chest wall deformity  Skin:  no rash or lesions.  GU:  no dysuria, change in color of urine, no urgency or frequency. No flank pain.  Musculoskeletal:  No joint pain or swelling. No decreased range of motion. No back pain.  Psych:  No change in mood or affect. No depression or anxiety. No  memory loss.   Past Medical History  Diagnosis Date  . Irregular heart rate   . Macular degeneration   . Cognitive impairment   . Hypotension   . 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 Glaucoma   . Atrial fibrillation   . Dysrhythmia   . GERD (gastroesophageal reflux disease)   . Dementia   . CHF (congestive heart failure)   . Pneumonia 09/05/2014   Past Surgical History  Procedure Laterality Date  . Mohs surgery  2006, 07/06/12    Left Lower Eyelid: Invasive Squamous Cell Carcinoma  . Shave biopsy  05/14/12    Left Lower Forehead  . Skin punch biopsy  05/14/12    Mid Left Lower Eyelid  . Eye surgery     Social History:  reports that he has never smoked. He has never used smokeless tobacco. He reports that he does not drink alcohol or use illicit drugs.  No Known Allergies  Family History  Problem Relation Age of Onset  . Skin cancer Sister     Non-melanoma  . Stroke Mother   . Stroke Father   . Diabetes Father     Prior to Admission medications   Medication Sig Start Date End Date Taking? Authorizing Provider  acetaminophen (TYLENOL) 325 MG tablet Take 650 mg by mouth 3 (three) times daily.   Yes Historical Provider, MD  albuterol (PROVENTIL HFA;VENTOLIN HFA) 108 (90 BASE) MCG/ACT inhaler Inhale 2 puffs into the lungs every 6 (six)  hours as needed for wheezing or shortness of breath.   Yes Historical Provider, MD  diclofenac sodium (VOLTAREN) 1 % GEL Apply 2 g topically 2 (two) times daily.    Yes Historical Provider, MD  digoxin (LANOXIN) 0.125 MG tablet Take 0.125 mg by mouth daily.   Yes Historical Provider, MD  latanoprost (XALATAN) 0.005 % ophthalmic solution Place 1 drop into both eyes at bedtime.   Yes Historical Provider, MD  Memantine HCl ER (NAMENDA XR) 28 MG CP24 Take 28 mg by mouth daily.   Yes Historical Provider, MD  morphine (ROXANOL) 20 MG/ML  concentrated solution Take 5 mg by mouth every 4 (four) hours as needed for severe pain.   Yes Historical Provider, MD  omeprazole (PRILOSEC) 20 MG capsule Take 1 capsule (20 mg total) by mouth daily. 04/24/15  Yes Marletta Lor, MD  Polyethyl Glycol-Propyl Glycol (SYSTANE) 0.4-0.3 % GEL Place 1 application into the left eye at bedtime. On to eyelid   Yes Historical Provider, MD  sertraline (ZOLOFT) 25 MG tablet Take 1 tablet (25 mg total) by mouth daily. 09/15/12  Yes Marletta Lor, MD  zinc oxide 20 % ointment Apply 1 application topically daily. Apply to buttocks and skin at end of spine.   Yes Historical Provider, MD  albuterol (PROVENTIL) (2.5 MG/3ML) 0.083% nebulizer solution Take 3 mLs (2.5 mg total) by nebulization every 6 (six) hours as needed for wheezing or shortness of breath. 09/07/14   Thurnell Lose, MD  polyvinyl alcohol (LIQUIFILM TEARS) 1.4 % ophthalmic solution Place 1 drop into the left eye daily as needed for dry eyes.    Historical Provider, MD   Physical Exam: Filed Vitals:   05/07/15 0445 05/07/15 0500 05/07/15 0515 05/07/15 0545  BP: 125/77 130/72 128/73 130/87  Pulse: 82 94 93 96  Temp:      Resp: 14 14 17 18   Height:      Weight:      SpO2: 98% 96% 95% 98%    Wt Readings from Last 3 Encounters:  05/07/15 86.183 kg (190 lb)  02/21/15 86.183 kg (190 lb)  09/03/14 83.371 kg (183 lb 12.8 oz)    General:  Drowsy arousible, moaning, in pACU Eyes: PERRL, erythema of lower L eyelid/skin tear,  ENT: grossly normal lips & tongue Neck: no LAD, masses or thyromegaly Cardiovascular: RRR, no m/r/g. No LE edema. Telemetry: SR, no arrhythmias  Respiratory: CTA bilaterally, no w/r/r. Normal respiratory effort. Abdomen: soft, ntnd Skin: no rash or induration seen on limited exam Musculoskeletal: L wrist in splint Psychiatric: unable to assess Neurologic: grossly non-focal, unable to assess          Labs on Admission:  Basic Metabolic Panel:  Recent  Labs Lab 05/07/15 0235  NA 138  K 3.2*  CL 115*  CO2 19*  GLUCOSE 114*  BUN 12  CREATININE 0.48*  CALCIUM 6.3*   Liver Function Tests: No results for input(s): AST, ALT, ALKPHOS, BILITOT, PROT, ALBUMIN in the last 168 hours. No results for input(s): LIPASE, AMYLASE in the last 168 hours. No results for input(s): AMMONIA in the last 168 hours. CBC:  Recent Labs Lab 05/07/15 0235  WBC 10.1  NEUTROABS 8.5*  HGB 12.1*  HCT 35.8*  MCV 95.7  PLT 168   Cardiac Enzymes: No results for input(s): CKTOTAL, CKMB, CKMBINDEX, TROPONINI in the last 168 hours.  BNP (last 3 results) No results for input(s): BNP in the last 8760 hours.  ProBNP (last 3 results)  Recent Labs  09/02/14 2018  PROBNP 363.3    CBG: No results for input(s): GLUCAP in the last 168 hours.  Radiological Exams on Admission: Dg Chest 1 View  05/07/2015   CLINICAL DATA:  Fall tonight.  EXAM: CHEST  1 VIEW  COMPARISON:  09/03/2014  FINDINGS: Cardiac enlargement without vascular congestion. Mild linear fibrosis or atelectasis in the lung bases, improved since previous study. No focal airspace disease. No blunting of costophrenic angles. No pneumothorax. Calcified and tortuous aorta. Fractures noted of the proximal left humerus, incompletely included on with view.  IMPRESSION: Cardiac enlargement. No vascular congestion or edema. Slight fibrosis in the lung bases.   Electronically Signed   By: Lucienne Capers M.D.   On: 05/07/2015 03:21   Dg Elbow 2 Views Left  05/07/2015   CLINICAL DATA:  Left arm pain after a fall.  EXAM: LEFT ELBOW - 2 VIEW  COMPARISON:  None.  FINDINGS: Limited two view study with oblique cross-table lateral. No gross evidence of acute fracture or dislocation of the left elbow. Due to obliquity of the lateral, cannot assess for effusions.  IMPRESSION: No obvious fracture deformity demonstrated on limited views.   Electronically Signed   By: Lucienne Capers M.D.   On: 05/07/2015 03:20   Dg  Wrist 2 Views Left  05/07/2015   CLINICAL DATA:  Fall tonight.  Left arm pain.  Visible deformities.  EXAM: LEFT WRIST - 2 VIEW  COMPARISON:  None.  FINDINGS: Limited two view examination. Comminuted fractures of the distal left radial and ulnar metaphysis. Displacement of multiple butterfly fragments. Impaction of fracture fragments. Fracture lines likely extend to the articular and radial ulnar surfaces. Soft tissue swelling.  IMPRESSION: Multiple comminuted fractures of the distal left radial and ulnar metaphysis.   Electronically Signed   By: Lucienne Capers M.D.   On: 05/07/2015 03:18   Dg Shoulder Left  05/07/2015   CLINICAL DATA:  Left arm pain.  Fall tonight.  EXAM: LEFT SHOULDER - 2+ VIEW  COMPARISON:  None.  FINDINGS: Multiple comminuted fractures of the proximal meta diaphysis of the left humerus. There is lateral displacement of distal fracture fragments. No evidence of dislocation of the left shoulder. Acromioclavicular and coracoclavicular spaces are maintained. Degenerative changes in the spine.  IMPRESSION: Comminuted fractures of the proximal left humerus.   Electronically Signed   By: Lucienne Capers M.D.   On: 05/07/2015 03:19    EKG: Independently reviewed. NSR, non specific St changes  Assessment/Plan Principal Problem:    Wrist fracture, left/L humerus fracture -per Ortho, s/p ORIF L wrist -L humerus -non operative mgt -pain control, caution with narcotics due to age and cognitive dysfunction     Chronic atrial fibrillation -rate controlled, continue digoxin, check level -not on anticoagulation, fall risk and Hospice patient    Chronic Diastolic CHF -stable, compensated, gentle IVF for today -normal EF on ECHO 4/15    Dementia  -stable    Hypocalcemia -unclear if true hypocalcemia, check albumin, I cal -also check PTH, mag  Code Status: DNR, per daughter  DVT Prophylaxis: start lovenox tomorrow Family Communication: called and d/w daughter  Arthor Captain Disposition Plan: inpatient  Time spent: 11min  Isela Stantz [facility] Pager [phone]

## 2015-05-07 NOTE — ED Provider Notes (Signed)
CSN: [id number]     Arrival date & time 05/07/15  0130 History  This chart was scribed for Serita Grit, MD by Randa Evens, ED Scribe. This patient was seen in room A13C/A13C and the patient's care was started at 1:43 AM.     Chief Complaint  Patient presents with  . Fall   Patient is a 81 y.o. male presenting with fall. The history is provided by the patient, the EMS personnel and a relative. No language interpreter was used.  Fall This is a new problem. The current episode started 1 to 2 hours ago. Exacerbated by: movement. Nothing relieves the symptoms. He has tried nothing for the symptoms.   HPI Comments: Howard Payne is a 81 y.o. male brought in by ambulance, who presents to the Emergency Department complaining of fall onset tonight PTA. Per EMS he has swelling in the left wrist, small abrasion to the left hand and deformity to the left shoulder. Pt states that his feet just slipped out from up under him. Daughter states that the nursing home called her stating that he was locking his door tonight and may have tripped over his walker. Pt states that the pain is worse with movement. Pt denies head injury, LOC, back pain or neck pain. Daughter states that he is in assisted living. Daughter states that he is on hospice current for his Hx of CHF. Pt is not on any blood thinners.   Past Medical History  Diagnosis Date  . Irregular heart rate   . Macular degeneration   . Cognitive impairment   . Hypotension   . 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 Glaucoma   . Atrial fibrillation   . Dysrhythmia   . GERD (gastroesophageal reflux disease)   . Dementia   . CHF (congestive heart failure)   . Pneumonia 09/05/2014   Past Surgical History  Procedure Laterality Date  . Mohs surgery  2006, 07/06/12    Left Lower Eyelid: Invasive Squamous Cell Carcinoma  . Shave biopsy  05/14/12     Left Lower Forehead  . Skin punch biopsy  05/14/12    Mid Left Lower Eyelid  . Eye surgery     Family History  Problem Relation Age of Onset  . Skin cancer Sister     Non-melanoma  . Stroke Mother   . Stroke Father   . Diabetes Father    History  Substance Use Topics  . Smoking status: Never Smoker   . Smokeless tobacco: Never Used  . Alcohol Use: No    Review of Systems  Musculoskeletal: Positive for joint swelling and arthralgias.  All other systems reviewed and are negative.     Allergies  Review of patient's allergies indicates no known allergies.  Home Medications   Prior to Admission medications   Medication Sig Start Date End Date Taking? Authorizing Provider  acetaminophen (TYLENOL) 325 MG tablet Take 650 mg by mouth 3 (three) times daily.    Historical Provider, MD  albuterol (PROVENTIL HFA;VENTOLIN HFA) 108 (90 BASE) MCG/ACT inhaler Inhale 2 puffs into the lungs every 6 (six) hours as needed for wheezing or shortness of breath.    Historical Provider, MD  albuterol (PROVENTIL) (2.5 MG/3ML) 0.083% nebulizer solution Take 3 mLs (2.5 mg total) by nebulization every 6 (six) hours as needed for wheezing or shortness of breath. 09/07/14  Thurnell Lose, MD  diclofenac sodium (VOLTAREN) 1 % GEL Apply 2 g topically 2 (two) times daily.     Historical Provider, MD  digoxin (LANOXIN) 0.125 MG tablet Take 0.125 mg by mouth daily.    Historical Provider, MD  diphenhydrAMINE (BENADRYL) 25 MG tablet Take 25 mg by mouth every 6 (six) hours as needed for itching.    Historical Provider, MD  guaiFENesin (MUCINEX) 600 MG 12 hr tablet Take 600 mg by mouth 2 (two) times daily as needed for cough or to loosen phlegm. 10/05/13   Marletta Lor, MD  latanoprost (XALATAN) 0.005 % ophthalmic solution Place 1 drop into both eyes at bedtime.    Historical Provider, MD  Memantine HCl ER (NAMENDA XR) 28 MG CP24 Take 28 mg by mouth daily.    Historical Provider, MD  omeprazole  (PRILOSEC) 20 MG capsule Take 1 capsule (20 mg total) by mouth daily. 04/24/15   Marletta Lor, MD  Polyethyl Glycol-Propyl Glycol (SYSTANE) 0.4-0.3 % GEL Place 1 application into the left eye at bedtime. On to eyelid    Historical Provider, MD  polyvinyl alcohol (LIQUIFILM TEARS) 1.4 % ophthalmic solution Place 1 drop into the left eye daily as needed for dry eyes.    Historical Provider, MD  sertraline (ZOLOFT) 25 MG tablet Take 1 tablet (25 mg total) by mouth daily. 09/15/12   Marletta Lor, MD   Pulse 87  Temp(Src) 98.9 F (37.2 C)  Resp 11  Ht 6\' 1"  (1.854 m)  Wt 190 lb (86.183 kg)  BMI 25.07 kg/m2  SpO2 94%   Physical Exam  Constitutional: He is oriented to person, place, and time. He appears well-developed and well-nourished. No distress.  HENT:  Head: Normocephalic and atraumatic. Head is without raccoon's eyes and without Battle's sign.  Nose: Nose normal.  Eyes: Conjunctivae and EOM are normal. Pupils are equal, round, and reactive to light. No scleral icterus.  Neck: No spinous process tenderness and no muscular tenderness present.  Cardiovascular: Normal rate, regular rhythm, normal heart sounds and intact distal pulses.   No murmur heard. Pulmonary/Chest: Effort normal and breath sounds normal. He has no rales. He exhibits no tenderness.  Abdominal: Soft. There is no tenderness. There is no rebound and no guarding.  Musculoskeletal: Normal range of motion. He exhibits no edema.       Left shoulder: He exhibits tenderness and bony tenderness.       Left wrist: He exhibits tenderness, bony tenderness, deformity and laceration.       Thoracic back: He exhibits no tenderness and no bony tenderness.       Lumbar back: He exhibits no tenderness and no bony tenderness.  No evidence of trauma to extremities, except as noted.  2+ distal pulses.    Neurological: He is alert and oriented to person, place, and time.  Skin: Skin is warm and dry. No rash noted.  Psychiatric:  He has a normal mood and affect.  Nursing note and vitals reviewed.   ED Course  Procedures (including critical care time) DIAGNOSTIC STUDIES: Oxygen Saturation is 98% on RA, normal by my interpretation.    COORDINATION OF CARE: 2:09 AM-Discussed treatment plan with pt at bedside and pt agreed to plan.     Labs Review Labs Reviewed  CBC WITH DIFFERENTIAL/PLATELET - Abnormal; Notable for the following:    RBC 3.74 (*)    Hemoglobin 12.1 (*)    HCT 35.8 (*)    Neutrophils Relative %  84 (*)    Neutro Abs 8.5 (*)    Lymphocytes Relative 7 (*)    All other components within normal limits  BASIC METABOLIC PANEL - Abnormal; Notable for the following:    Potassium 3.2 (*)    Chloride 115 (*)    CO2 19 (*)    Glucose, Bld 114 (*)    Creatinine, Ser 0.48 (*)    Calcium 6.3 (*)    Anion gap 4 (*)    All other components within normal limits  DIGOXIN LEVEL - Abnormal; Notable for the following:    Digoxin Level 0.2 (*)    All other components within normal limits  URINE CULTURE  ALBUMIN  MAGNESIUM  PARATHYROID HORMONE, INTACT (NO CA)  CALCIUM, IONIZED  GENTAMICIN LEVEL, RANDOM  URINALYSIS, ROUTINE W REFLEX MICROSCOPIC (NOT AT [facility])  COMPREHENSIVE METABOLIC PANEL  CBC    Imaging Review Dg Chest 1 View  05/07/2015   CLINICAL DATA:  Fall tonight.  EXAM: CHEST  1 VIEW  COMPARISON:  09/03/2014  FINDINGS: Cardiac enlargement without vascular congestion. Mild linear fibrosis or atelectasis in the lung bases, improved since previous study. No focal airspace disease. No blunting of costophrenic angles. No pneumothorax. Calcified and tortuous aorta. Fractures noted of the proximal left humerus, incompletely included on with view.  IMPRESSION: Cardiac enlargement. No vascular congestion or edema. Slight fibrosis in the lung bases.   Electronically Signed   By: Lucienne Capers M.D.   On: 05/07/2015 03:21   Dg Elbow 2 Views Left  05/07/2015   CLINICAL DATA:  Left arm pain after a fall.   EXAM: LEFT ELBOW - 2 VIEW  COMPARISON:  None.  FINDINGS: Limited two view study with oblique cross-table lateral. No gross evidence of acute fracture or dislocation of the left elbow. Due to obliquity of the lateral, cannot assess for effusions.  IMPRESSION: No obvious fracture deformity demonstrated on limited views.   Electronically Signed   By: Lucienne Capers M.D.   On: 05/07/2015 03:20   Dg Wrist 2 Views Left  05/07/2015   CLINICAL DATA:  Fall tonight.  Left arm pain.  Visible deformities.  EXAM: LEFT WRIST - 2 VIEW  COMPARISON:  None.  FINDINGS: Limited two view examination. Comminuted fractures of the distal left radial and ulnar metaphysis. Displacement of multiple butterfly fragments. Impaction of fracture fragments. Fracture lines likely extend to the articular and radial ulnar surfaces. Soft tissue swelling.  IMPRESSION: Multiple comminuted fractures of the distal left radial and ulnar metaphysis.   Electronically Signed   By: Lucienne Capers M.D.   On: 05/07/2015 03:18   Dg Shoulder Left  05/07/2015   CLINICAL DATA:  Left arm pain.  Fall tonight.  EXAM: LEFT SHOULDER - 2+ VIEW  COMPARISON:  None.  FINDINGS: Multiple comminuted fractures of the proximal meta diaphysis of the left humerus. There is lateral displacement of distal fracture fragments. No evidence of dislocation of the left shoulder. Acromioclavicular and coracoclavicular spaces are maintained. Degenerative changes in the spine.  IMPRESSION: Comminuted fractures of the proximal left humerus.   Electronically Signed   By: Lucienne Capers M.D.   On: 05/07/2015 03:19  All radiology studies independently viewed by me.      EKG Interpretation   Date/Time:  Sunday May 07 2015 01:43:28 EDT Ventricular Rate:  91 PR Interval:  182 QRS Duration: 83 QT Interval:  330 QTC Calculation: 406 R Axis:   58 Text Interpretation:  Atrial fibrillation Nonspecific repol abnormality,  diffuse  leads No significant change was found Confirmed by  [facility]  MD,  TREY ([id number]) on 05/07/2015 11:11:59 PM      MDM   Final diagnoses:  Fall  left open forearm fracture Left humerus fracture    81 yo male presenting after a fall.  He believes it was mechanical after stumbling while trying to shut his door.  He complains primarily of left wrist pain.  Has open deformity.  Also found to have left humerus fracture.  He has no evidence of head trauma and I discussed forgoing neuroimaging with his daughter, who agreed not to pursue imaging.  Hand surgery consulted and Dr. Amedeo Plenty took to OR.     I personally performed the services described in this documentation, which was scribed in my presence. The recorded information has been reviewed and is accurate.      Serita Grit, MD 05/07/15 2312

## 2015-05-07 NOTE — ED Notes (Signed)
CRITICAL VALUE ALERT  Critical value received: calcium 6.3

## 2015-05-07 NOTE — ED Notes (Signed)
MD at bedside. 

## 2015-05-07 NOTE — Anesthesia Procedure Notes (Addendum)
Anesthesia Regional Block:  Supraclavicular block  Pre-Anesthetic Checklist: ,, timeout performed, Correct Patient, Correct Site, Correct Laterality, Correct Procedure, Correct Position, site marked, Risks and benefits discussed,  Surgical consent,  Pre-op evaluation,  At surgeon's request and post-op pain management  Laterality: Left and Upper  Prep: chloraprep       Needles:  Injection technique: Single-shot  Needle Type: Echogenic Stimulator Needle     Needle Length: 5cm 5 cm Needle Gauge: 21 and 21 G    Additional Needles:  Procedures: ultrasound guided (picture in chart) Supraclavicular block Narrative:  Start time: 05/07/2015 6:27 AM End time: 05/07/2015 6:32 AM Injection made incrementally with aspirations every 5 mL.  Performed by: Personally  Anesthesiologist: CREWS, DAVID   Procedure Name: Intubation Date/Time: 05/07/2015 6:55 AM Performed by: Marinda Elk A Pre-anesthesia Checklist: Patient identified, Timeout performed, Emergency Drugs available, Suction available and Patient being monitored Patient Re-evaluated:Patient Re-evaluated prior to inductionOxygen Delivery Method: Circle system utilized Preoxygenation: Pre-oxygenation with 100% oxygen Intubation Type: IV induction Laryngoscope Size: Mac and 4 Grade View: Grade III Tube type: Oral Tube size: 7.5 mm Number of attempts: 1 Airway Equipment and Method: Stylet Placement Confirmation: ETT inserted through vocal cords under direct vision,  breath sounds checked- equal and bilateral and positive ETCO2 Secured at: 23 cm Tube secured with: Tape Dental Injury: Teeth and Oropharynx as per pre-operative assessment

## 2015-05-07 NOTE — Op Note (Signed)
Howard Payne, FARNEY               ACCOUNT NO.:  [id number]  MEDICAL RECORD NO.:  [id number]  LOCATION:  5N29C                        FACILITY:  [facility]  PHYSICIAN:  Satira Anis. Philip Kotlyar, M.D.DATE OF BIRTH:  [birth year]  DATE OF PROCEDURE: DATE OF DISCHARGE:                              OPERATIVE REPORT   PREOPERATIVE DIAGNOSES: 1. Open comminuted complex distal ulna and distal radius fractures,     left upper extremity. 2. Closed proximal humerus fracture, left shoulder girdle.  POSTOPERATIVE DIAGNOSES: 1. Open comminuted complex distal ulna and distal radius fractures,     left upper extremity. 2. Closed proximal humerus fracture, left shoulder girdle.  PROCEDURE: 1. Irrigation and debridement of open fracture of radius and ulna     region, type 1 open x2 puncture areas about the left wrist.  This     was an excisional debridement of skin, subcutaneous tissue, bone,     tendon, and muscle. 2. Open reduction and internal fixation of distal radius with an     extended cross-locked DVR plate and screw construct and allograft     bone graft. 3. Open reduction and internal fixation of distal third ulna shaft     fracture/distal ulna fracture just proximal to the distal     radioulnar joint. 4. AP, lateral, and oblique x-rays performed, examined, and     interpreted by myself, left wrist and forearm. 5. Closed reduction of left proximal humerus fracture under a general     anesthetic.  SURGEON:  Satira Anis. Gaming, M.D.  ASSISTANT:  Avelina Laine, PA-C.  COMPLICATION:  None.  INDICATIONS:  This patient is a 81 year old male with congestive heart failure.  He is actually in hospice care for his CHF.  He fell as he was stumbling today and sustained the above-mentioned injuries.  I have discussed all issues with his family.  I feel that he has multiple options.  Given his delicate health status, I have recommended that we treat him with antibiotics, I and D, and offered ORIF  versus simply a cursory I and D.  We had a long discussion about this and I feel the most prudent option for him to try to give him the best extremity possible is a formal ORIF of the wrist with an I and D to prevent infection and maintain stability.  I would recommend closed treatment of the proximal humerus fracture.  The family concurs and desires to proceed.  All questions have been encouraged and answered and addressed.  I have called hospitalist and I personally called Anesthesia in regard to his care plan options and the anesthetic approach.  OPERATIVE PROCEDURE:  The patient was seen by myself and Anesthesia, taken to the operative suite, underwent smooth induction of anesthesia. He did have a general LMA type anesthetic as well as preoperative block. Following this, the arm was prepped and draped personally by myself with a preop Hibiclens scrub followed by 10 minutes surgical Betadine scrub. Outline marks were made visually under sterile field and the tourniquet was insufflated.  Time-out was called.  Following this, a volar radial incision was made.  Dissection was carried down the interval where the puncture wounds occurred, were opened  up and 1 mm rim of tissue was removed.  At this time, I very carefully dissected down and incised the FCR sheath palmarly and dorsally.  I then very carefully and cautiously retracted the carpal canal contents.  The median nerve was identifiable as was the palmar cutaneous branch of the median nerve.  All structures were carefully handled.  Pronator was then incised and the fracture was accessed.  The bony spike which protruded in 2 places was bit off, irrigated and at this time, I performed I and D of skin, subcutaneous tissue, bone, tendon, and muscle.  This was an excisional debridement with curette, knife, blade, and scissor tip.  The patient tolerated this well.  Following this, the patient then underwent placement of over 3 L of saline  in the wound for lavage purposes.  This completed the I and D.  Following this, drapes were changed and the patient then underwent aggressive bone grafting with allograft bone graft.  This was a stay graft from [employer].  This was carefully packed and following this, reduction accomplished and extended cross-locked DVR plate was placed.  I was able to achieve adequate radial height, inclination, and volar tilt to our satisfaction and following this, I was pleased with the geometry on AP lateral and oblique x-rays, performed, examined and interpreted by myself.  This was an ORIF of comminuted complex distal radius fracture.  At this time, we repaired the pronator, deflated the tourniquet, and took x-rays.  Once this was complete, we then very carefully and cautiously closed the wound after hemostasis was secured over drain. The patient tolerated this well.  I, at this time, then brought in x-ray once again and performed evaluation of the ulna.  I did not want to leave the ulna simply alone, but I felt that the very fragmented nature would not be amenable to definitive fixation that would stay on distal radius. Thus I chose an intramedullary rod.  I made an incision, dissected down very carefully and introduced a 60 K-wire, pre-bent it, and then engaged it in the shaft. The patient tolerated this well.  This lined up the fracture nicely and will serve as an intramedullary guide.  This was pre-bent and bent back on itself so as not to propagate.  The patient tolerated this well.  I did this in a limited exposure fashion given the patient's swelling, age, and other issues.  Following this, I closed the wound. AP, lateral and oblique x-rays performed, examined, and interpreted by myself looked excellent.  Following this, we performed gentle closed reduction of the proximal humerus.  It lined up well and there were no complicating features.  He was then placed in a sling.  Going forward,  he will be admitted to the hospitalist service.  IV antibiotics, general postop observation, and usual and standard care plan for the arm will be attended to.  We are going to hope that conservative measures will suffice for the shoulder and that he goes on to heal.  It was a pleasure to see him today.  He is a very kind gentleman.     Satira Anis. Amedeo Plenty, M.D.    [facility]  D:  05/07/2015  T:  05/07/2015  Job:  [id number]

## 2015-05-08 ENCOUNTER — Encounter (HOSPITAL_COMMUNITY): Payer: Self-pay | Admitting: Orthopedic Surgery

## 2015-05-08 DIAGNOSIS — S62102D Fracture of unspecified carpal bone, left wrist, subsequent encounter for fracture with routine healing: Secondary | ICD-10-CM

## 2015-05-08 DIAGNOSIS — F0390 Unspecified dementia without behavioral disturbance: Secondary | ICD-10-CM

## 2015-05-08 DIAGNOSIS — I5032 Chronic diastolic (congestive) heart failure: Secondary | ICD-10-CM

## 2015-05-08 DIAGNOSIS — K219 Gastro-esophageal reflux disease without esophagitis: Secondary | ICD-10-CM

## 2015-05-08 DIAGNOSIS — S42302A Unspecified fracture of shaft of humerus, left arm, initial encounter for closed fracture: Secondary | ICD-10-CM

## 2015-05-08 LAB — CBC
HCT: 35.2 % — ABNORMAL LOW (ref 39.0–52.0)
Hemoglobin: 12 g/dL — ABNORMAL LOW (ref 13.0–17.0)
MCH: 32.6 pg (ref 26.0–34.0)
MCHC: 34.1 g/dL (ref 30.0–36.0)
MCV: 95.7 fL (ref 78.0–100.0)
PLATELETS: 170 10*3/uL (ref 150–400)
RBC: 3.68 MIL/uL — AB (ref 4.22–5.81)
RDW: 14 % (ref 11.5–15.5)
WBC: 11.5 10*3/uL — AB (ref 4.0–10.5)

## 2015-05-08 LAB — COMPREHENSIVE METABOLIC PANEL
ALT: 14 U/L — ABNORMAL LOW (ref 17–63)
AST: 21 U/L (ref 15–41)
Albumin: 2.7 g/dL — ABNORMAL LOW (ref 3.5–5.0)
Alkaline Phosphatase: 74 U/L (ref 38–126)
Anion gap: 7 (ref 5–15)
BUN: 11 mg/dL (ref 6–20)
CO2: 26 mmol/L (ref 22–32)
Calcium: 8.4 mg/dL — ABNORMAL LOW (ref 8.9–10.3)
Chloride: 102 mmol/L (ref 101–111)
Creatinine, Ser: 0.58 mg/dL — ABNORMAL LOW (ref 0.61–1.24)
GFR calc Af Amer: >60 mL/min (ref >60)
GFR calc non Af Amer: >60 mL/min (ref >60)
GLUCOSE: 110 mg/dL — AB (ref 65–99)
POTASSIUM: 4.2 mmol/L (ref 3.5–5.1)
Sodium: 135 mmol/L (ref 135–145)
TOTAL PROTEIN: 5.4 g/dL — AB (ref 6.5–8.1)
Total Bilirubin: 2.3 mg/dL — ABNORMAL HIGH (ref 0.3–1.2)

## 2015-05-08 LAB — URINALYSIS, ROUTINE W REFLEX MICROSCOPIC
BILIRUBIN URINE: NEGATIVE
Glucose, UA: NEGATIVE mg/dL
HGB URINE DIPSTICK: NEGATIVE
Ketones, ur: NEGATIVE mg/dL
Leukocytes, UA: NEGATIVE
Nitrite: NEGATIVE
PROTEIN: NEGATIVE mg/dL
Specific Gravity, Urine: 1.022 (ref 1.005–1.030)
Urobilinogen, UA: 1 mg/dL (ref 0.0–1.0)
pH: 6.5 (ref 5.0–8.0)

## 2015-05-08 LAB — MRSA PCR SCREENING: MRSA BY PCR: POSITIVE — AB

## 2015-05-08 LAB — PARATHYROID HORMONE, INTACT (NO CA): PTH: 76 pg/mL — AB (ref 15–65)

## 2015-05-08 LAB — GENTAMICIN LEVEL, RANDOM: Gentamicin Rm: 10 ug/mL

## 2015-05-08 LAB — CALCIUM, IONIZED: Calcium, Ionized, Serum: 4.6 mg/dL (ref 4.5–5.6)

## 2015-05-08 MED ORDER — CHLORHEXIDINE GLUCONATE CLOTH 2 % EX PADS
6.0000 | MEDICATED_PAD | Freq: Every day | CUTANEOUS | Status: DC
  Administered 2015-05-09 – 2015-05-10 (×2): 6 via TOPICAL

## 2015-05-08 MED ORDER — MUPIROCIN 2 % EX OINT
1.0000 "application " | TOPICAL_OINTMENT | Freq: Two times a day (BID) | CUTANEOUS | Status: DC
  Administered 2015-05-08 – 2015-05-10 (×5): 1 via NASAL
  Filled 2015-05-08: qty 22

## 2015-05-08 MED ORDER — DEXTROSE 5 % IV SOLN: 520.0000 mg | INTRAVENOUS | Status: DC

## 2015-05-08 MED ORDER — GENTAMICIN SULFATE 40 MG/ML IJ SOLN
520.0000 mg | INTRAVENOUS | Status: DC
  Administered 2015-05-09: 520 mg via INTRAVENOUS
  Filled 2015-05-08: qty 13

## 2015-05-08 NOTE — Evaluation (Addendum)
Physical Therapy Evaluation Patient Details Name: Vedanth Sirico MRN: [id number] DOB: 1920-09-10 Today's Date: 05/08/2015   History of Present Illness  Patient is a 81 y/o male admitted s/p fall with left proximal humerus fx and left wrist fx s/p ORIF left wrist. L humerus fx non operative. PMH includes dementia, A-fib, CHF, skin cancer.  Clinical Impression  Patient presents with pain, weakness and immobility LUE s/p recent surgery and left humerus fx impacting mobility. Pt with hx of dementia. Very groggy during session. Family present to provide PLOF/history. Pt requires total A for transfer back to bed. Pt not safe to return to ALF and would benefit from ST SNF to maximize independence and mobility.     Follow Up Recommendations SNF;Supervision/Assistance - 24 hour    Equipment Recommendations  None recommended by PT    Recommendations for Other Services       Precautions / Restrictions Precautions Precautions: Fall Required Braces or Orthoses: Sling Other Brace/Splint: left shoulder immobilizer worn at all times.  Restrictions Other Position/Activity Restrictions: No WB precautions noted.  Assumed NWB.      Mobility  Bed Mobility Overal bed mobility: Needs Assistance Bed Mobility: Sit to Supine       Sit to supine: Total assist;+2 for physical assistance;Max assist   General bed mobility comments: Total A of 2 to get back to bed. Pt groggy.  Transfers Overall transfer level: Needs assistance   Transfers: Stand Pivot Transfers   Stand pivot transfers: Total assist;+2 physical assistance       General transfer comment: Performed SPT chair to bed using chuck pad as sling.   Ambulation/Gait                Stairs            Wheelchair Mobility    Modified Rankin (Stroke Patients Only)       Balance Overall balance assessment: Needs assistance Sitting-balance support: Feet supported;No upper extremity supported Sitting balance-Leahy  Scale: Poor Sitting balance - Comments: Requires external support to maintain sitting balance EOB. Postural control: Posterior lean Standing balance support: During functional activity Standing balance-Leahy Scale: Zero                               Pertinent Vitals/Pain Pain Assessment: Faces Faces Pain Scale: Hurts a little bit Pain Location: LUE with movement Pain Descriptors / Indicators: Grimacing Pain Intervention(s): Monitored during session;Repositioned    Home Living Family/patient expects to be discharged to:: Skilled nursing facility                      Prior Function Level of Independence: Needs assistance   Gait / Transfers Assistance Needed: Per family, pt uses RW and w/c for mobility at ALF.  ADL's / Homemaking Assistance Needed: Assistance with bathing and dressing (a lot according to daughter)        Hand Dominance   Dominant Hand: Left    Extremity/Trunk Assessment   Upper Extremity Assessment: Defer to OT evaluation           Lower Extremity Assessment: Generalized weakness         Communication   Communication: No difficulties  Cognition Arousal/Alertness: Awake/alert Behavior During Therapy: WFL for tasks assessed/performed Overall Cognitive Status: History of cognitive impairments - at baseline                      General  Comments General comments (skin integrity, edema, etc.): Family present to provide PLOF/history. Encouraged AROM digits on LUE to reduce swelling.   Exercises        Assessment/Plan    PT Assessment Patient needs continued PT services  PT Diagnosis Acute pain;Generalized weakness   PT Problem List Decreased strength;Pain;Decreased cognition;Decreased balance;Decreased mobility;Decreased activity tolerance  PT Treatment Interventions Balance training;Gait training;Functional mobility training;Therapeutic activities;Therapeutic exercise;Wheelchair mobility training;Patient/family  education;DME instruction   PT Goals (Current goals can be found in the Care Plan section) Acute Rehab PT Goals Patient Stated Goal: none stated PT Goal Formulation: With patient/family Time For Goal Achievement: 05/22/15 Potential to Achieve Goals: Good    Frequency Min 3X/week   Barriers to discharge Decreased caregiver support      Co-evaluation               End of Session Equipment Utilized During Treatment: Gait belt Activity Tolerance: Patient limited by lethargy;Patient limited by pain Patient left: in bed;with bed alarm set;with call bell/phone within reach;with family/visitor present Nurse Communication: Mobility status;Need for lift equipment         Time: 1135-1153 PT Time Calculation (min) (ACUTE ONLY): 18 min   Charges:   PT Evaluation $Initial PT Evaluation Tier I: 1 Procedure     PT G Codes:        Yobana Culliton A Woodson Macha 05/08/2015, 12:13 PM Wray Kearns, [place], DPT [phone]

## 2015-05-08 NOTE — Progress Notes (Addendum)
[facility] PROGRESS NOTE  Howard Payne BZJ:[id number] DOB: 1920-01-01 DOA: 05/07/2015 PCP: Nyoka Cowden, MD  Assessment/Plan: 1-left wrist and left humerus fracture: -s/p ORIF -continue pain medications as needed (been careful with oversedation) -patient with left humerus fracture not amenable for intervention -continue sling -per PT/OT rec's will need SNF at discharge -will continue providing supportive care  2-chronic atrial fibrillation: not longer on anticoagulation (high risk for falls and dementia) -continue digoxin for rate control  3-dementia with depression:  -continue namenda -continue sertraline  -continue supportive care  4-chronic diastolic heart failure -preserved EF in 02/2014 -will follow daily weights and monitor I's and O's -discussed with family about sodium intake  5-GERD: will continue PPI  6-positive MRSA screening: patient started on decolonization process with use of Bactroban and chlorhexidene   Code Status: DNR Family Communication: daughter at bedside  Disposition Plan: SNF at discharge   Consultants: Dr. Amedeo Plenty (orthopedic service)  Procedures:  S/p left wrist ORIF 6/20  Antibiotics:  None   HPI/Subjective: Afebrile, no CP, no SOB. Patient with left arm on sling and clean dressings in place. Moderate discomfort per nursing staff   Objective: Filed Vitals:   05/08/15 1300  BP: 106/59  Pulse: 95  Temp: 98.3 F (36.8 C)  Resp: 18    Intake/Output Summary (Last 24 hours) at 05/08/15 1701 Last data filed at 05/08/15 0175  Gross per 24 hour  Intake    170 ml  Output    110 ml  Net     60 ml   Filed Weights   05/07/15 0143  Weight: 86.183 kg (190 lb)    Exam:   General:  Pleasantly confused, moderate pain on his left arm; no fever, able to follow commands.  Cardiovascular: regular rate, no rubs or gallops  Respiratory: CTA, no wheezing  Abdomen: soft, NT, ND, positive BS  Musculoskeletal:  left arm with clean dressings and sling in place, decrease range of motion on his left limb appreciated  Data Reviewed: Basic Metabolic Panel:  Recent Labs Lab 05/07/15 0235 05/07/15 0952 05/08/15 0538  NA 138  --  135  K 3.2*  --  4.2  CL 115*  --  102  CO2 19*  --  26  GLUCOSE 114*  --  110*  BUN 12  --  11  CREATININE 0.48*  --  0.58*  CALCIUM 6.3*  --  8.4*  MG  --  2.1  --    Liver Function Tests:  Recent Labs Lab 05/07/15 0952 05/08/15 0538  AST  --  21  ALT  --  14*  ALKPHOS  --  74  BILITOT  --  2.3*  PROT  --  5.4*  ALBUMIN 3.6 2.7*   CBC:  Recent Labs Lab 05/07/15 0235 05/08/15 0538  WBC 10.1 11.5*  NEUTROABS 8.5*  --   HGB 12.1* 12.0*  HCT 35.8* 35.2*  MCV 95.7 95.7  PLT 168 170   ProBNP (last 3 results)  Recent Labs  09/02/14 2018  PROBNP 363.3    Recent Results (from the past 240 hour(s))  MRSA PCR Screening     Status: Abnormal   Collection Time: 05/08/15 10:42 AM  Result Value Ref Range Status   MRSA by PCR POSITIVE (A) NEGATIVE Final    Comment:        The GeneXpert MRSA Assay (FDA approved for NASAL specimens only), is one component of a comprehensive MRSA colonization surveillance program. It is not intended to diagnose MRSA  infection nor to guide or monitor treatment for MRSA infections. RESULT CALLED TO, READ BACK BY AND VERIFIED WITH: Lilli Light RN 12:45 05/08/15 ([email or web address])      Studies: Dg Chest 1 View  05/07/2015   CLINICAL DATA:  Fall tonight.  EXAM: CHEST  1 VIEW  COMPARISON:  09/03/2014  FINDINGS: Cardiac enlargement without vascular congestion. Mild linear fibrosis or atelectasis in the lung bases, improved since previous study. No focal airspace disease. No blunting of costophrenic angles. No pneumothorax. Calcified and tortuous aorta. Fractures noted of the proximal left humerus, incompletely included on with view.  IMPRESSION: Cardiac enlargement. No vascular congestion or edema. Slight fibrosis in the lung bases.    Electronically Signed   By: Lucienne Capers M.D.   On: 05/07/2015 03:21   Dg Elbow 2 Views Left  05/07/2015   CLINICAL DATA:  Left arm pain after a fall.  EXAM: LEFT ELBOW - 2 VIEW  COMPARISON:  None.  FINDINGS: Limited two view study with oblique cross-table lateral. No gross evidence of acute fracture or dislocation of the left elbow. Due to obliquity of the lateral, cannot assess for effusions.  IMPRESSION: No obvious fracture deformity demonstrated on limited views.   Electronically Signed   By: Lucienne Capers M.D.   On: 05/07/2015 03:20   Dg Wrist 2 Views Left  05/07/2015   CLINICAL DATA:  Fall tonight.  Left arm pain.  Visible deformities.  EXAM: LEFT WRIST - 2 VIEW  COMPARISON:  None.  FINDINGS: Limited two view examination. Comminuted fractures of the distal left radial and ulnar metaphysis. Displacement of multiple butterfly fragments. Impaction of fracture fragments. Fracture lines likely extend to the articular and radial ulnar surfaces. Soft tissue swelling.  IMPRESSION: Multiple comminuted fractures of the distal left radial and ulnar metaphysis.   Electronically Signed   By: Lucienne Capers M.D.   On: 05/07/2015 03:18   Dg Shoulder Left  05/07/2015   CLINICAL DATA:  Left arm pain.  Fall tonight.  EXAM: LEFT SHOULDER - 2+ VIEW  COMPARISON:  None.  FINDINGS: Multiple comminuted fractures of the proximal meta diaphysis of the left humerus. There is lateral displacement of distal fracture fragments. No evidence of dislocation of the left shoulder. Acromioclavicular and coracoclavicular spaces are maintained. Degenerative changes in the spine.  IMPRESSION: Comminuted fractures of the proximal left humerus.   Electronically Signed   By: Lucienne Capers M.D.   On: 05/07/2015 03:19    Scheduled Meds: .  ceFAZolin (ANCEF) IV  1 g Intravenous 3 times per day  . [START ON 05/09/2015] Chlorhexidine Gluconate Cloth  6 each Topical Q0600  . digoxin  0.125 mg Oral Daily  . enoxaparin (LOVENOX)  injection  40 mg Subcutaneous Q24H  . [START ON 05/09/2015] gentamicin  520 mg Intravenous Q48H  . latanoprost  1 drop Both Eyes QHS  . memantine  28 mg Oral Daily  . mupirocin ointment  1 application Nasal BID  . pantoprazole  40 mg Oral Daily  . polyvinyl alcohol  1 drop Both Eyes QHS  . senna  1 tablet Oral BID  . 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 If 7PM-7AM, please contact night-coverage at [email or web address], password [facility] 05/08/2015, 5:01 PM  LOS: 1 day

## 2015-05-08 NOTE — Progress Notes (Addendum)
[facility]IP RN Visit- [place] [facility] RN Visit-Stacie Delice Lesch RN, BSN  This is a related admission to [facility] Dx of CHF.  Patient is in DNR.  Patient seen in room sitting up in chair with daughter at bedside.  Raquel Sarna, hospital social worker present and currently working on SNF placement. Patient appears in pain with facial grimacing noted and leaning to right side of chair with left arm guarded. Patient received one dose of 5 mg Oxycodone IR in the past 24 hours, per chart review. Reyne Dumas, staff RN notified that patient appeared uncomfortable and stated he would assess. Patient receiving IV Ancef TID via PIV. Encouraged daughter to call [facility] for any questions or concerns.  She verbalized understanding.  [facility] will continue to follow daily. Please call with any questions.  Annia Belt RN, [facility] Liaison [phone]

## 2015-05-08 NOTE — Progress Notes (Signed)
Subjective: 1 Day Post-Op Procedure(s) (LRB): OPEN REDUCTION INTERNAL FIXATION (ORIF) WRIST FRACTURE (Left) IRRIGATION AND DEBRIDEMENT LEFT WRIST (Left) Patient reports pain as mild.   I discussed all issues  He looks well overall and is alert. Objective: Vital signs in last 24 hours: Temp:  [98.2 F (36.8 C)-99.3 F (37.4 C)] 98.2 F (36.8 C) (06/20 0549) Pulse Rate:  [85-94] 94 (06/20 0549) Resp:  [17] 17 (06/19 0930) BP: (115-148)/(69-85) 115/69 mmHg (06/20 0549) SpO2:  [97 %-99 %] 98 % (06/20 0549)  Intake/Output from previous day: 06/19 0701 - 06/20 0700 In: 763 [I.V.:600; IV Piggyback:163] Out: 240 [Urine:200; Blood:40] Intake/Output this shift:     Recent Labs  05/07/15 0235 05/08/15 0538  HGB 12.1* 12.0*    Recent Labs  05/07/15 0235 05/08/15 0538  WBC 10.1 11.5*  RBC 3.74* 3.68*  HCT 35.8* 35.2*  PLT 168 170    Recent Labs  05/07/15 0235 05/08/15 0538  NA 138 135  K 3.2* 4.2  CL 115* 102  CO2 19* 26  BUN 12 11  CREATININE 0.48* 0.58*  GLUCOSE 114* 110*  CALCIUM 6.3* 8.4*   No results for input(s): LABPT, INR in the last 72 hours. Physical exam.  Normal refill.  No evidence of comp caring features.  Drain is removed without difficulty.  All questions have been encouraged and answered with family.  We'll continue close observation. Neurologically intact ABD soft No cellulitis present Compartment soft  Assessment/Plan: 1 Day Post-Op Procedure(s) (LRB): OPEN REDUCTION INTERNAL FIXATION (ORIF) WRIST FRACTURE (Left) IRRIGATION AND DEBRIDEMENT LEFT WRIST (Left)  I would recommend care management for skilled nursing placement. I would recommend continued IV antibiotics.  We'll plan for close treatment of his shoulder. His surgical approach to the forearm is now complete. Elevation range of motion and OT for edema control be the rule. Paulene Floor 05/08/2015, 7:26 AM

## 2015-05-08 NOTE — Clinical Social Work Placement (Signed)
   CLINICAL SOCIAL WORK PLACEMENT  NOTE  Date:  05/08/2015  Patient Details  Name: Rydell Wiegel MRN: [id number] Date of Birth: 1920-05-08  Clinical Social Work is seeking post-discharge placement for this patient at the [place] level of care (*CSW will initial, date and re-position this form in  chart as items are completed):  Yes   Patient/family provided with [place] Work Department's list of facilities offering this level of care within the geographic area requested by the patient (or if unable, by the patient's family).  Yes   Patient/family informed of their freedom to choose among providers that offer the needed level of care, that participate in Medicare, Medicaid or managed care program needed by the patient, have an available bed and are willing to accept the patient.  Yes   Patient/family informed of [facility]'s ownership interest in [facility] and [facility], as well as of the fact that they are under no obligation to receive care at these facilities.  PASRR submitted to EDS on  (n/a)     PASRR number received on  (n/a)     Existing PASRR number confirmed on 05/08/15     FL2 transmitted to all facilities in geographic area requested by pt/family on 05/08/15     FL2 transmitted to all facilities within larger geographic area on  (n/a)     Patient informed that his/her managed care company has contracts with or will negotiate with certain facilities, including the following:   (yes, [facility])         Patient/family informed of bed offers received.  Patient chooses bed at       Physician recommends and patient chooses bed at      Patient to be transferred to   on  .  Patient to be transferred to facility by       Patient family notified on   of transfer.  Name of family member notified:        PHYSICIAN Please sign FL2, Please sign DNR     Additional Comment:     _______________________________________________ Caroline Sauger, LCSW 05/08/2015, 12:39 PM [phone]

## 2015-05-08 NOTE — Progress Notes (Signed)
[facility]) Chaplain Visit: Met with pt's grandson in law Marcello Moores in pt's room (husband of granddaughter Vicente Males, who is daughter of pt's daughter Golden Circle).  Pt drowsy in bed but able to rouse on hearing name and recognized chaplain from previous visits in ALF, with pt weak but cordial in manner, wincing at times with discomfort, left arm in sling, but pt assuring chaplain he was OK.  Pt readily affirmed faith and was thankful for reassurance and prayer before drifting back to rest.  Marcello Moores thankful for visit and support, but not able to share about pt status or plan of care.  Chaplain assured family of ongoing support. Carlus Pavlov, ThM, Chaplain [facility]

## 2015-05-08 NOTE — Evaluation (Signed)
Occupational Therapy Evaluation and Discharge Patient Details Name: Howard Payne MRN: [id number] DOB: Dec 25, 1919 Today's Date: 05/08/2015    History of Present Illness Patient is a 81 y/o male admitted s/p fall with left proximal humerus fx and left wrist fx s/p ORIF left wrist. L humerus fx non operative. PMH includes dementia, A-fib, CHF, skin cancer.   Clinical Impression   This 81 yo male admitted and underwent above presents to acute OT with decreased use of LUE, decreased mobility, decreased balance, and increased pain all affecting his ability to care for himself at he was pta at the ALF. He will benefit from continued OT at SNF, acute OT will sign off.    Follow Up Recommendations  SNF    Equipment Recommendations   (TBD next venue)       Precautions / Restrictions Precautions Precautions: Fall Required Braces or Orthoses: Sling Other Brace/Splint: left shoulder immobilizer worn at all times.  Restrictions Weight Bearing Restrictions: Yes Other Position/Activity Restrictions: LUE NWB'ing      Mobility Bed Mobility Overal bed mobility: Needs Assistance Bed Mobility: Supine to Sit     Supine to sit: Max assist;HOB elevated (use of bed pad) Sit to supine: Total assist;+2 for physical assistance;Max assist   General bed mobility comments: Total A of 2 to get back to bed. Pt groggy.  Transfers Overall transfer level: Needs assistance   Transfers: Stand Pivot Transfers   Stand pivot transfers: Max assist;+2 physical assistance       General transfer comment: bed> recliner with use of belt and chuck pad    Balance Overall balance assessment: Needs assistance Sitting-balance support: Feet supported;Single extremity supported Sitting balance-Leahy Scale: Poor Sitting balance - Comments: Requires external support to maintain sitting balance EOB. Postural control: Posterior lean Standing balance support: No upper extremity supported Standing balance-Leahy  Scale: Zero                              ADL                                         General ADL Comments: total A     Vision Additional Comments: No change from baseline          Pertinent Vitals/Pain Pain Assessment: Faces Faces Pain Scale: Hurts a little bit Pain Location: LUE with movement Pain Descriptors / Indicators: Grimacing Pain Intervention(s): Monitored during session;Repositioned     Hand Dominance  (ambi)   Extremity/Trunk Assessment Upper Extremity Assessment Upper Extremity Assessment: LUE deficits/detail LUE Deficits / Details: good AROM of digits with cued to do so; rest of arm immoblized LUE Coordination: decreased gross motor;decreased fine motor   Lower Extremity Assessment Lower Extremity Assessment: Generalized weakness       Communication Communication Communication: No difficulties   Cognition Arousal/Alertness: Awake/alert Behavior During Therapy: WFL for tasks assessed/performed Overall Cognitive Status: History of cognitive impairments - at baseline                                Home Living Family/patient expects to be discharged to:: Skilled nursing facility  Prior Functioning/Environment Level of Independence: Needs assistance  Gait / Transfers Assistance Needed: Per family, pt uses RW and w/c for mobility at ALF. ADL's / Homemaking Assistance Needed: Assistance with bathing and dressing (a lot according to daughter)        OT Diagnosis: Generalized weakness;Cognitive deficits;Acute pain   OT Problem List: Decreased strength;Decreased range of motion;Impaired balance (sitting and/or standing);Pain;Impaired UE functional use;Decreased knowledge of use of DME or AE      OT Goals(Current goals can be found in the care plan section) Acute Rehab OT Goals Patient Stated Goal: good to be out bed and thank you  OT Frequency:                 End of Session Equipment Utilized During Treatment: Gait belt Nurse Communication: Mobility status (Nt)  Activity Tolerance: Patient limited by fatigue;Patient limited by pain Patient left: in chair;with call bell/phone within reach;with family/visitor present   Time: 8864-8472 OT Time Calculation (min): 48 min Charges:  OT General Charges $OT Visit: 1 Procedure OT Evaluation $Initial OT Evaluation Tier I: 1 Procedure OT Treatments $Therapeutic Activity: 23-37 mins  Almon Register [phone] 05/08/2015, 2:58 PM

## 2015-05-08 NOTE — Progress Notes (Signed)
Orthopedic Tech Progress Note Patient Details:  Howard Payne [birth year] [id number]  Ortho Devices Type of Ortho Device: Shoulder immobilizer Ortho Device/Splint Location: lue Ortho Device/Splint Interventions: Application   Agustin Swatek 05/08/2015, 11:16 AM

## 2015-05-08 NOTE — Care Management (Signed)
Utilization review completed by Paulyne Mooty N. Alanii Ramer, RN BSN 

## 2015-05-08 NOTE — Clinical Social Work Note (Signed)
Clinical Social Work Assessment  Patient Details  Name: Howard Payne MRN: [id number] Date of Birth: [birth year]  Date of referral:  05/08/15               Reason for consult:  Facility Placement, Discharge Planning                Permission sought to share information with:  Family Supports, Customer service manager Permission granted to share information::  No (Patient with dementia, daughter at bedside.)  Name::     Arthor Captain  Agency::  [facility] SN[facility]  Relationship::  daughter  Contact Information:  [phone]  Housing/Transportation Living arrangements for the past 2 months:  [place] ([facility] ) Source of Information:  Adult Children Patient Interpreter Needed:  None Criminal Activity/Legal Involvement Pertinent to Current Situation/Hospitalization:  No - Comment as needed Significant Relationships:  Adult Children Lives with:  Facility Resident Do you feel safe going back to the place where you live?  No (Patient requiring higher level of care at discharge.) Need for family participation in patient care:  Yes (Comment) (Patient's daughters active in patient's care.)  Care giving concerns:  Patient's daughter expressed no concerns at this time.   Social Worker assessment / plan:  CSW met with patient and patient's daughter at bedside to discuss patient's discharge disposition. Per patient's daughter, patient admitted from [facility], but is agreeable to recommendation for SNF placement at time of discharge. CSW to continue to follow and assist with discharge planning needs.  Employment status:  Retired Forensic scientist:  Medicare PT Recommendations:  Not assessed at this time [place] / Referral to community resources:  [place]  Patient/Family's Response to care:  Patient's daughter understanding and agreeable to CSW plan of care.  Patient/Family's Understanding of and Emotional Response to Diagnosis,  Current Treatment, and Prognosis:  Patient's daughter understanding and agreeable to CSW plan of care.  Emotional Assessment Appearance:  Appears younger than stated age Attitude/Demeanor/Rapport:  Other, Lethargic (Patient asleep at bedside.) Affect (typically observed):  Other (Patient asleep at bedside.) Orientation:  Oriented to Place, Oriented to Self Alcohol / Substance use:  Not Applicable Psych involvement (Current and /or in the community):  No (Comment) (Not appropriate on this admission.)  Discharge Needs  Concerns to be addressed:  No discharge needs identified Readmission within the last 30 days:  No Current discharge risk:  None Barriers to Discharge:  No Barriers Identified   Caroline Sauger, LCSW 05/08/2015, 12:11 PM [phone]

## 2015-05-09 ENCOUNTER — Encounter (HOSPITAL_COMMUNITY): Payer: Self-pay | Admitting: Orthopedic Surgery

## 2015-05-09 DIAGNOSIS — S42302D Unspecified fracture of shaft of humerus, left arm, subsequent encounter for fracture with routine healing: Secondary | ICD-10-CM

## 2015-05-09 LAB — URINE CULTURE: CULTURE: NO GROWTH

## 2015-05-09 MED ORDER — MORPHINE SULFATE 2 MG/ML IJ SOLN
1.0000 mg | INTRAMUSCULAR | Status: DC | PRN
  Administered 2015-05-09 (×2): 1 mg via INTRAVENOUS
  Administered 2015-05-10 (×2): 2 mg via INTRAVENOUS
  Filled 2015-05-09 (×4): qty 1

## 2015-05-09 NOTE — Progress Notes (Signed)
Subjective: 2 Days Post-Op Procedure(s) (LRB): OPEN REDUCTION INTERNAL FIXATION (ORIF) WRIST FRACTURE (Left) IRRIGATION AND DEBRIDEMENT LEFT WRIST (Left) Patient trying to sleep. He has had a restless day per his daughter. He's been intermittently in pain today. He has only taken and oxycodone for pain. The daughter states he has tolerated morphine in the past.   Objective: Vital signs in last 24 hours: Temp:  [99.2 F (37.3 C)-99.5 F (37.5 C)] 99.2 F (37.3 C) (06/21 0605) Pulse Rate:  [95-96] 96 (06/21 0605) Resp:  [18] 18 (06/21 1300) BP: (98-107)/(55-70) 106/70 mmHg (06/21 1300) SpO2:  [94 %-98 %] 96 % (06/21 1300)  Intake/Output from previous day: 06/20 0701 - 06/21 0700 In: 240 [P.O.:240] Out: 320 [Urine:320] Intake/Output this shift:     Recent Labs  05/07/15 0235 05/08/15 0538  HGB 12.1* 12.0*    Recent Labs  05/07/15 0235 05/08/15 0538  WBC 10.1 11.5*  RBC 3.74* 3.68*  HCT 35.8* 35.2*  PLT 168 170    Recent Labs  05/07/15 0235 05/08/15 0538  NA 138 135  K 3.2* 4.2  CL 115* 102  CO2 19* 26  BUN 12 11  CREATININE 0.48* 0.58*  GLUCOSE 114* 110*  CALCIUM 6.3* 8.4*   No results for input(s): LABPT, INR in the last 72 hours.  Physical examination reveals that the patient is somewhat malpositioned in the bed. We have repositioned him and placed the left upper extremity in a better position with the forearm across the abdomen and a pillow supporting the posterior aspect of the humerus and shoulder. The straps are loosened about the immobilizer as well as a shoulder strap so this does not cause undue pressure in his cervical area. He states this feels better overall. Evaluation of the left upper extremity shows that he has moderate ecchymosis about the hand mild swelling but is able to elicit a full fist and extension with mild degree of tenderness present sensation refill are intact. No signs of infection back the hand or cellulitic process is  present.  Assessment/Plan: 2 Days Post-Op Procedure(s) (LRB): OPEN REDUCTION INTERNAL FIXATION (ORIF) WRIST FRACTURE (Left) IRRIGATION AND DEBRIDEMENT LEFT WRIST (Left) I have discussed with his daughter at length positioning issues for when he is discharged to short-term nursing facility and will ultimately home. The forearm across the abdomen is a safe position with support under the posterior shoulder and humerus. We have discussed with him better pain control and have asked the nurse to begin morphine 1 mg to 2 mg IV every 3-4 needed in addition to the oxycodone. Currently the family are reviewing [facility]. The family does have an appointment tomorrow to 2 or the facility. We will plan on pain control and continued IV antibiotics while he is inpatient given this was an open fracture. Once a bed becomes available at the facility of choice he will be discharged and will need to follow up in our office setting approximately 10-12 days for a wound check, sutures to be removed, repeat radiographs and cast placement. We went over all issues with the daughter at length. Was a pleasure to see him today.  Ranada Vigorito L 05/09/2015, 4:13 PM

## 2015-05-09 NOTE — Progress Notes (Signed)
[facility]IP RN Visit- [facility] [place] [facility] RN Visit-Stacie Delice Lesch RN, BSN  This is a related admission to [facility] Dx of CHF. Patient is in DNR. Patient seen in room laying in bed.  Patient alert, oriented to person only.  Patient denies any pain and appears comfortable.  Spoke with Jeani Hawking, [facility] social worker and plan is for patient to D/C to a SNF in am. Patient received 4 doses of 5mg  Oxycodone IR in the past 24 hours. Patient receiving Ancef IV TID.  No family present at time of visit.  [facility] will continue to follow daily. Please call with any questions.  Annia Belt RN, [facility] Liaison [phone]

## 2015-05-09 NOTE — Clinical Social Work Placement (Signed)
   CLINICAL SOCIAL WORK PLACEMENT  NOTE  Date:  05/09/2015  Patient Details  Name: Howard Payne MRN: [id number] Date of Birth: 1920-09-23  Clinical Social Work is seeking post-discharge placement for this patient at the [place] level of care (*CSW will initial, date and re-position this form in  chart as items are completed):  Yes   Patient/family provided with [facility] Work Department's list of facilities offering this level of care within the geographic area requested by the patient (or if unable, by the patient's family).  Yes   Patient/family informed of their freedom to choose among providers that offer the needed level of care, that participate in Medicare, Medicaid or managed care program needed by the patient, have an available bed and are willing to accept the patient.  Yes   Patient/family informed of [facility] ownership interest in [facility] and [facility], as well as of the fact that they are under no obligation to receive care at these facilities.  PASRR submitted to EDS on  (n/a)     PASRR number received on  (n/a)     Existing PASRR number confirmed on 05/08/15     FL2 transmitted to all facilities in geographic area requested by pt/family on 05/08/15     FL2 transmitted to all facilities within larger geographic area on  (n/a)     Patient informed that his/her managed care company has contracts with or will negotiate with certain facilities, including the following:   (yes, [facility])     Yes   Patient/family informed of bed offers received.  Patient chooses bed at [facility]     Physician recommends and patient chooses bed at  (n/a)    Patient to be transferred to   on  .  Patient to be transferred to facility by       Patient family notified on   of transfer.  Name of family member notified:        PHYSICIAN Please sign FL2, Please sign DNR     Additional Comment:     _______________________________________________ Caroline Sauger, LCSW 05/09/2015, 3:38 PM [phone]

## 2015-05-09 NOTE — Progress Notes (Signed)
[facility] PROGRESS NOTE  Howard Payne WNI:[id number] DOB: Sep 10, 1920 DOA: 05/07/2015 PCP: Nyoka Cowden, MD  Assessment/Plan: 1-left wrist and left humerus fracture: -s/p ORIF -continue pain medications as needed (been careful with oversedation) -patient with left humerus fracture not amenable for intervention -continue sling and will follow postoperative rec's from orthopedic service -per PT/OT rec's will need SNF at discharge -will continue providing supportive care  2-chronic atrial fibrillation: not longer on anticoagulation (high risk for falls and dementia) -continue digoxin for rate control  3-dementia with depression:  -continue namenda -continue sertraline  -continue supportive care  4-chronic diastolic heart failure -preserved EF in 02/2014 -will follow daily weights and monitor I's and O's -discussed with family about sodium intake  5-GERD: will continue PPI  6-positive MRSA screening: patient started on decolonization process with use of Bactroban and chlorhexidene   Code Status: DNR Family Communication: daughter at bedside  Disposition Plan: SNF at discharge   Consultants: Dr. Amedeo Plenty (orthopedic service)  Procedures:  S/p left wrist ORIF 6/20  Antibiotics:  None   HPI/Subjective: Afebrile, no CP, no SOB. Patient with left arm on sling and clean dressings in place. Reports having pain and sometimes unable to find comfortable position. Not eating much according to daughter   Objective: Filed Vitals:   05/09/15 2052  BP: 104/64  Pulse: 95  Temp: 99.1 F (37.3 C)  Resp: 18    Intake/Output Summary (Last 24 hours) at 05/09/15 2155 Last data filed at 05/09/15 1300  Gross per 24 hour  Intake    240 ml  Output    120 ml  Net    120 ml   Filed Weights   05/07/15 0143  Weight: 86.183 kg (190 lb)    Exam:   General:  Patient was AAOX2, reports pain in his left hand/arm was moderate. No fever, no CP and no  SOB.  Cardiovascular: regular rate, no rubs or gallops  Respiratory: CTA, no wheezing  Abdomen: soft, NT, ND, positive BS  Musculoskeletal: left arm with clean dressings and sling in place, decrease range of motion on his left limb appreciated  Data Reviewed: Basic Metabolic Panel:  Recent Labs Lab 05/07/15 0235 05/07/15 0952 05/08/15 0538  NA 138  --  135  K 3.2*  --  4.2  CL 115*  --  102  CO2 19*  --  26  GLUCOSE 114*  --  110*  BUN 12  --  11  CREATININE 0.48*  --  0.58*  CALCIUM 6.3*  --  8.4*  MG  --  2.1  --    Liver Function Tests:  Recent Labs Lab 05/07/15 0952 05/08/15 0538  AST  --  21  ALT  --  14*  ALKPHOS  --  74  BILITOT  --  2.3*  PROT  --  5.4*  ALBUMIN 3.6 2.7*   CBC:  Recent Labs Lab 05/07/15 0235 05/08/15 0538  WBC 10.1 11.5*  NEUTROABS 8.5*  --   HGB 12.1* 12.0*  HCT 35.8* 35.2*  MCV 95.7 95.7  PLT 168 170   ProBNP (last 3 results)  Recent Labs  09/02/14 2018  PROBNP 363.3    Recent Results (from the past 240 hour(s))  Culture, Urine     Status: None   Collection Time: 05/08/15 12:33 AM  Result Value Ref Range Status   Specimen Description URINE, CLEAN CATCH  Final   Special Requests NONE  Final   Culture NO GROWTH 1 DAY  Final  Report Status 05/09/2015 FINAL  Final  MRSA PCR Screening     Status: Abnormal   Collection Time: 05/08/15 10:42 AM  Result Value Ref Range Status   MRSA by PCR POSITIVE (A) NEGATIVE Final    Comment:        The GeneXpert MRSA Assay (FDA approved for NASAL specimens only), is one component of a comprehensive MRSA colonization surveillance program. It is not intended to diagnose MRSA infection nor to guide or monitor treatment for MRSA infections. RESULT CALLED TO, READ BACK BY AND VERIFIED WITH: Lilli Light RN 12:45 05/08/15 ([email or web address])      Studies: No results found.  Scheduled Meds: .  ceFAZolin (ANCEF) IV  1 g Intravenous 3 times per day  . Chlorhexidine Gluconate Cloth  6 each  Topical Q0600  . digoxin  0.125 mg Oral Daily  . enoxaparin (LOVENOX) injection  40 mg Subcutaneous Q24H  . gentamicin  520 mg Intravenous Q48H  . latanoprost  1 drop Both Eyes QHS  . memantine  28 mg Oral Daily  . mupirocin ointment  1 application Nasal BID  . pantoprazole  40 mg Oral Daily  . polyvinyl alcohol  1 drop Both Eyes QHS  . senna  1 tablet Oral BID  . 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 If 7PM-7AM, please contact night-coverage at [email or web address], password [facility] 05/09/2015, 9:55 PM  LOS: 2 days

## 2015-05-10 DIAGNOSIS — C44129 Squamous cell carcinoma of skin of left eyelid, including canthus: Secondary | ICD-10-CM

## 2015-05-10 DIAGNOSIS — S52202F Unspecified fracture of shaft of left ulna, subsequent encounter for open fracture type IIIA, IIIB, or IIIC with routine healing: Secondary | ICD-10-CM

## 2015-05-10 DIAGNOSIS — S5292XF Unspecified fracture of left forearm, subsequent encounter for open fracture type IIIA, IIIB, or IIIC with routine healing: Secondary | ICD-10-CM

## 2015-05-10 LAB — BASIC METABOLIC PANEL
Anion gap: 9 (ref 5–15)
BUN: 17 mg/dL (ref 6–20)
CO2: 27 mmol/L (ref 22–32)
Calcium: 8.1 mg/dL — ABNORMAL LOW (ref 8.9–10.3)
Chloride: 97 mmol/L — ABNORMAL LOW (ref 101–111)
Creatinine, Ser: 0.78 mg/dL (ref 0.61–1.24)
GFR calc Af Amer: >60 mL/min (ref >60)
GFR calc non Af Amer: >60 mL/min (ref >60)
GLUCOSE: 91 mg/dL (ref 65–99)
POTASSIUM: 4 mmol/L (ref 3.5–5.1)
Sodium: 133 mmol/L — ABNORMAL LOW (ref 135–145)

## 2015-05-10 MED ORDER — MORPHINE SULFATE (CONCENTRATE) 20 MG/ML PO SOLN: 5.0000 mg | ORAL | Status: AC | PRN

## 2015-05-10 MED ORDER — OXYCODONE HCL 5 MG PO TABS: 5.0000 mg | ORAL_TABLET | ORAL | Status: AC | PRN

## 2015-05-10 MED ORDER — ARTIFICIAL TEARS OP OINT: TOPICAL_OINTMENT | Freq: Every day | OPHTHALMIC | Status: AC

## 2015-05-10 NOTE — Progress Notes (Signed)
Physical Therapy Treatment Patient Details Name: Howard Payne MRN: [id number] DOB: 1920-08-19 Today's Date: 05/10/2015    History of Present Illness Patient is a 81 y/o male admitted s/p fall with left proximal humerus fx and left wrist fx s/p ORIF left wrist. L humerus fx non operative. PMH includes dementia, A-fib, CHF, skin cancer.    PT Comments    Patient very lethargic limiting mobility this session. Able to sit EOB ~8 minutes with Mod A-total A and intermittent Min guard assist for safety. Prior to transfer to chair, pt resistive with therapists pushing posteriorly making transfer unsafe and resulting in return to supine. Will continue to follow to maximize independence.   Follow Up Recommendations  SNF;Supervision/Assistance - 24 hour     Equipment Recommendations  None recommended by PT    Recommendations for Other Services       Precautions / Restrictions Precautions Precautions: Fall Required Braces or Orthoses: Sling Other Brace/Splint: left shoulder immobilizer worn at all times.  Restrictions Weight Bearing Restrictions: Yes Other Position/Activity Restrictions: LUE NWB'ing    Mobility  Bed Mobility Overal bed mobility: Needs Assistance Bed Mobility: Supine to Sit     Supine to sit: Max assist;+2 for physical assistance (use of pad.) Sit to supine: Total assist;+2 for physical assistance   General bed mobility comments: Total A of 2 to get back to bed. Pt groggy.  Transfers Overall transfer level:  (Not assessed secondary to pt resisting therapist in sitting.)                  Ambulation/Gait                 Stairs            Wheelchair Mobility    Modified Rankin (Stroke Patients Only)       Balance Overall balance assessment: Needs assistance Sitting-balance support: Feet supported;No upper extremity supported Sitting balance-Leahy Scale: Zero Sitting balance - Comments: Initially Mod A for support progressing to  total A as pt resisting therapists with anterior weight shift and pushing posteriorly. Intermittent Min guard assist for 2 minutes. Sitting EOB ~8 minutes.  Postural control: Posterior lean                          Cognition Arousal/Alertness: Lethargic Behavior During Therapy: WFL for tasks assessed/performed Overall Cognitive Status: History of cognitive impairments - at baseline                      Exercises      General Comments        Pertinent Vitals/Pain Pain Assessment: Faces Faces Pain Scale: Hurts even more Pain Location: LUE with movement Pain Descriptors / Indicators: Grimacing Pain Intervention(s): Monitored during session;Repositioned    Home Living                      Prior Function            PT Goals (current goals can now be found in the care plan section) Progress towards PT goals: Not progressing toward goals - comment (2/2 to lethargy and resisting therapists prior to transfer.)    Frequency  Min 3X/week    PT Plan Current plan remains appropriate    Co-evaluation             End of Session Equipment Utilized During Treatment: Gait belt Activity Tolerance: Patient limited by pain;Patient limited by  lethargy Patient left: in bed;with call bell/phone within reach;with bed alarm set;with family/visitor present     Time: 1610-9604 PT Time Calculation (min) (ACUTE ONLY): 22 min  Charges:  $Therapeutic Activity: 8-22 mins                    G Codes:      Avarey Yaeger A Akram Kissick 05/10/2015, 1:17 PM Wray Kearns, [place], DPT [phone]

## 2015-05-10 NOTE — Discharge Planning (Signed)
Patient to be discharged to [facility]. Patient's daughters updated at bedside. Hospice CSW updated regarding discharge disposition.  Facility: [facility] RN report number: [phone] Transportation: EMS ([address]  Lubertha Sayres, [place] [phone]) and Surgical [phone])

## 2015-05-10 NOTE — Care Management (Signed)
Utilization review completed by Heitor Steinhoff N. Ian Castagna, RN BSN 

## 2015-05-10 NOTE — Clinical Social Work Placement (Signed)
   CLINICAL SOCIAL WORK PLACEMENT  NOTE  Date:  05/10/2015  Patient Details  Name: Howard Payne MRN: [id number] Date of Birth: [birth year]  Clinical Social Work is seeking post-discharge placement for this patient at the [place] level of care (*CSW will initial, date and re-position this form in  chart as items are completed):  Yes   Patient/family provided with [facility] Work Department's list of facilities offering this level of care within the geographic area requested by the patient (or if unable, by the patient's family).  Yes   Patient/family informed of their freedom to choose among providers that offer the needed level of care, that participate in Medicare, Medicaid or managed care program needed by the patient, have an available bed and are willing to accept the patient.  Yes   Patient/family informed of [facility]'s ownership interest in [facility] and [facility], as well as of the fact that they are under no obligation to receive care at these facilities.  PASRR submitted to EDS on  (n/a)     PASRR number received on  (n/a)     Existing PASRR number confirmed on 05/08/15     FL2 transmitted to all facilities in geographic area requested by pt/family on 05/08/15     FL2 transmitted to all facilities within larger geographic area on  (n/a)     Patient informed that his/her managed care company has contracts with or will negotiate with certain facilities, including the following:   (yes, [facility])     Yes   Patient/family informed of bed offers received.  Patient chooses bed at [facility]     Physician recommends and patient chooses bed at  (n/a)    Patient to be transferred to [facility] on 05/10/15.  Patient to be transferred to facility by [facility]     Patient family notified on 05/10/15 of transfer.  Name of family member notified:  Patient's daughters updated at bedside.      PHYSICIAN       Additional Comment:    _______________________________________________ Caroline Sauger, LCSW 05/10/2015, 3:05 PM [phone]

## 2015-05-10 NOTE — Discharge Summary (Signed)
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 Will be discharge to SNF for rehab and care.  Diet recommendation: heart healthy diet  Filed Weights   05/07/15 0143  Weight: 86.183 kg (190 lb)    History of present illness:  81 y.o. male currently a resident of ALF with PMH of mild Dementia, Afib not on anticoagulation at this time, Chronic diastolic CHF, followed by Hospice presents after a fall, pt reported that his feet slipped and fell over, subsequently started experiencing L wrist pain and was sent to the ER. No h/o LOC, no chest pain, dizziness or palpitations per Howard Payne Course:  1-left wrist and left humerus fracture: -s/p ORIF by Dr. Amedeo Plenty -continue pain medications as needed (been careful with oversedation; but understanding how difficult is for him to express how much pain he has) -patient with left humerus fracture not amenable for intervention; planning conservative management and healing for second intention  -continue sling and will follow postoperatively with dr. Amedeo Plenty for incision review, suture removal and cast in 10-12 days -per PT/OT rec's will need SNF at discharge -will continue providing supportive care  2-chronic atrial fibrillation: not longer on anticoagulation (high risk for falls and dementia) -continue digoxin for rate  control  3-dementia with depression:  -continue namenda -continue sertraline  -continue supportive care and assistance  4-chronic diastolic heart failure -preserved EF in 02/2014 -follow daily weights and monitor I's and O's -discussed with family about sodium intake  5-GERD: will continue PPI  6-positive MRSA screening: patient received decolonization process with use of Bactroban and chlorhexidene   Procedures:  S/p left wrist ORIF 6/20  Consultations:  Dr. Amedeo Plenty (orthopedic service)  Discharge Exam: Filed Vitals:   05/10/15 0531  BP: 108/62  Pulse: 95  Temp: 98.8 F (37.1 C)  Resp: 18    General: Patient was AAOX2, reported pain in his left hand/arm was moderate. No fever, no CP and no SOB. Patient with very short memory and underlying dementia. Difficult to express how much pain he has.  Cardiovascular: regular rate, no rubs or gallops  Respiratory: CTA, no wheezing  Abdomen: soft, NT, ND, positive BS  Musculoskeletal: left arm with clean dressings and sling in place, decrease range of motion on his left limb appreciated   Discharge Instructions   Discharge Instructions    Discharge instructions    Complete by:  As directed   Follow up with orthopedic service in 10-12 days Close attention to patient pain and discomfort (due to dementia unable to express when he is uncomfortable); pre-medicate for physical therapy sessions Heart healthy diet          Current Discharge Medication List    START taking these medications   Details  artificial tears (LACRILUBE) OINT ophthalmic ointment Place into the left eye at bedtime. Cover eye if eye pad needed to prevent dryness    oxyCODONE (OXY IR/ROXICODONE) 5  MG immediate release tablet Take 1 tablet (5 mg total) by mouth every 4 (four) hours as needed for moderate pain or breakthrough pain. Qty: 30 tablet, Refills: 0      CONTINUE these medications which have CHANGED   Details  morphine (ROXANOL) 20  MG/ML concentrated solution Take 0.25 mLs (5 mg total) by mouth every 3 (three) hours as needed for severe pain (patient with short memory issues and difficulty expressing how much pain he has. please assess for agitation/distress as part of pain as well). Qty: 30 mL, Refills: 0      CONTINUE these medications which have NOT CHANGED   Details  acetaminophen (TYLENOL) 325 MG tablet Take 650 mg by mouth 3 (three) times daily.    albuterol (PROVENTIL HFA;VENTOLIN HFA) 108 (90 BASE) MCG/ACT inhaler Inhale 2 puffs into the lungs every 6 (six) hours as needed for wheezing or shortness of breath.    diclofenac sodium (VOLTAREN) 1 % GEL Apply 2 g topically 2 (two) times daily.     digoxin (LANOXIN) 0.125 MG tablet Take 0.125 mg by mouth daily.    latanoprost (XALATAN) 0.005 % ophthalmic solution Place 1 drop into both eyes at bedtime.    Memantine HCl ER (NAMENDA XR) 28 MG CP24 Take 28 mg by mouth daily.    omeprazole (PRILOSEC) 20 MG capsule Take 1 capsule (20 mg total) by mouth daily. Qty: 30 capsule, Refills: 12    Polyethyl Glycol-Propyl Glycol (SYSTANE) 0.4-0.3 % GEL Place 1 application into the left eye at bedtime. On to eyelid    sertraline (ZOLOFT) 25 MG tablet Take 1 tablet (25 mg total) by mouth daily. Qty: 30 tablet, Refills: 11    zinc oxide 20 % ointment Apply 1 application topically daily. Apply to buttocks and skin at end of spine.    albuterol (PROVENTIL) (2.5 MG/3ML) 0.083% nebulizer solution Take 3 mLs (2.5 mg total) by nebulization every 6 (six) hours as needed for wheezing or shortness of breath. Qty: 75 mL, Refills: 12    polyvinyl alcohol (LIQUIFILM TEARS) 1.4 % ophthalmic solution Place 1 drop into the left eye daily as needed for dry eyes.       No Known Allergies Follow-up Information    Follow up with Paulene Floor, MD. Schedule an appointment as soon as possible for a visit in 10 days.   Specialty:  Orthopedic Surgery   Contact information:   [address] [place] [address] [phone]        The results of significant diagnostics from this hospitalization (including imaging, microbiology, ancillary and laboratory) are listed below for reference.    Significant Diagnostic Studies: Dg Chest 1 View  05/07/2015   CLINICAL DATA:  Fall tonight.  EXAM: CHEST  1 VIEW  COMPARISON:  09/03/2014  FINDINGS: Cardiac enlargement without vascular congestion. Mild linear fibrosis or atelectasis in the lung bases, improved since previous study. No focal airspace disease. No blunting of costophrenic angles. No pneumothorax. Calcified and tortuous aorta. Fractures noted of the proximal left humerus, incompletely included on with view.  IMPRESSION: Cardiac enlargement. No vascular congestion or edema. Slight fibrosis in the lung bases.   Electronically Signed   By: Lucienne Capers M.D.   On: 05/07/2015 03:21   Dg Elbow 2 Views Left  05/07/2015   CLINICAL DATA:  Left arm pain after a fall.  EXAM: LEFT ELBOW - 2 VIEW  COMPARISON:  None.  FINDINGS: Limited two view study with oblique cross-table lateral. No  gross evidence of acute fracture or dislocation of the left elbow. Due to obliquity of the lateral, cannot assess for effusions.  IMPRESSION: No obvious fracture deformity demonstrated on limited views.   Electronically Signed   By: Lucienne Capers M.D.   On: 05/07/2015 03:20   Dg Wrist 2 Views Left  05/07/2015   CLINICAL DATA:  Fall tonight.  Left arm pain.  Visible deformities.  EXAM: LEFT WRIST - 2 VIEW  COMPARISON:  None.  FINDINGS: Limited two view examination. Comminuted fractures of the distal left radial and ulnar metaphysis. Displacement of multiple butterfly fragments. Impaction of fracture fragments. Fracture lines likely extend to the articular and radial ulnar surfaces. Soft tissue swelling.  IMPRESSION: Multiple comminuted fractures of the distal left radial and ulnar metaphysis.   Electronically Signed   By: Lucienne Capers M.D.   On: 05/07/2015 03:18   Dg Shoulder Left  05/07/2015   CLINICAL DATA:  Left arm pain.  Fall tonight.  EXAM: LEFT SHOULDER - 2+ VIEW  COMPARISON:  None.  FINDINGS: Multiple comminuted fractures of the proximal meta diaphysis of the left humerus. There is lateral displacement of distal fracture fragments. No evidence of dislocation of the left shoulder. Acromioclavicular and coracoclavicular spaces are maintained. Degenerative changes in the spine.  IMPRESSION: Comminuted fractures of the proximal left humerus.   Electronically Signed   By: Lucienne Capers M.D.   On: 05/07/2015 03:19    Microbiology: Recent Results (from the past 240 hour(s))  Culture, Urine     Status: None   Collection Time: 05/08/15 12:33 AM  Result Value Ref Range Status   Specimen Description URINE, CLEAN CATCH  Final   Special Requests NONE  Final   Culture NO GROWTH 1 DAY  Final   Report Status 05/09/2015 FINAL  Final  MRSA PCR Screening     Status: Abnormal   Collection Time: 05/08/15 10:42 AM  Result Value Ref Range Status   MRSA by PCR POSITIVE (A) NEGATIVE Final    Comment:        The GeneXpert MRSA Assay (FDA approved for NASAL specimens only), is one component of a comprehensive MRSA colonization surveillance program. It is not intended to diagnose MRSA infection nor to guide or monitor treatment for MRSA infections. RESULT CALLED TO, READ BACK BY AND VERIFIED WITH: Lilli Light RN 12:45 05/08/15 ([email or web address])      Labs: Basic Metabolic Panel:  Recent Labs Lab 05/07/15 0235 05/07/15 0952 05/08/15 0538 05/10/15 0547  NA 138  --  135 133*  K 3.2*  --  4.2 4.0  CL 115*  --  102 97*  CO2 19*  --  26 27  GLUCOSE 114*  --  110* 91  BUN 12  --  11 17  CREATININE 0.48*  --  0.58* 0.78  CALCIUM 6.3*  --  8.4* 8.1*  MG  --  2.1  --   --    Liver Function Tests:  Recent Labs Lab 05/07/15 0952 05/08/15 0538  AST  --  21  ALT  --  14*  ALKPHOS  --  74  BILITOT  --  2.3*  PROT  --   5.4*  ALBUMIN 3.6 2.7*   CBC:  Recent Labs Lab 05/07/15 0235 05/08/15 0538  WBC 10.1 11.5*  NEUTROABS 8.5*  --   HGB 12.1* 12.0*  HCT 35.8* 35.2*  MCV 95.7 95.7  PLT 168 170   ProBNP (last 3 results)  Recent Labs  09/02/14 2018  PROBNP 363.3    Signed:  Barton Dubois  [facility] 05/10/2015, 1:25 PM

## 2015-05-10 NOTE — Progress Notes (Signed)
GIP RN Visit- [place] [facility] [place] [facility] RN Visit-Stacie Delice Lesch RN, BSN  This is a related admission to [facility] Dx of CHF. Patient is in DNR. Patient seen in room asleep in bed.  Daughter at bedside.  Daughter reports that patient will be transferring to [place] later this afternoon.  [facility] social worker, Retail buyer notified.  Patient appears comfortable.  Patient continues to receive IV antibiotics. 4 doses of 2mg  Morphine given IV the past 24 hours per chart review.  Daughter denies any questions or needs.  Please call with any questions.  Annia Belt RN, [facility] Liaison [phone]

## 2015-05-11 NOTE — Progress Notes (Signed)
Charted in error. Wrong time 

## 2015-06-16 ENCOUNTER — Ambulatory Visit: Payer: Medicare Other | Admitting: Internal Medicine

## 2015-07-07 NOTE — Patient Outreach (Signed)
Howard Payne) Care Management  07/07/2015  Howard Payne 1920-01-19 [id number]   Assignment from [place] to Quinn Plowman, RN.  Thanks, Ronnell Freshwater. [place], [place] Assistant Phone: [phone] Fax: [phone]

## 2015-07-19 ENCOUNTER — Other Ambulatory Visit: Payer: Self-pay

## 2015-07-19 NOTE — Patient Outreach (Signed)
[place] [facility]) Care Management  07/19/2015  Howard Payne 11-15-1920 [id number]  SUBJECTIVE:  Received return call from Arthor Captain, [place] daughter.  Daughter states patient is now residing in [facility] in [place].    ASSESSMENT: Patient no longer in [facility] care management service area.  PLAN: RNCM will refer to Lattie Haw to close due to patient not meeting program criteria.  Patient currently resides in long term care facility. RNCM will notify primary MD of closure.  Quinn Plowman RN,BSN,CCM [place] Coordinator [phone]

## 2015-07-19 NOTE — Patient Outreach (Signed)
Howard Payne) Care Management  07/19/2015  Howard Payne [birth year] [id number]   Telephone call to patient regarding high risk list referral.  Unable to reach patient.  HIPAA compliant voice message left with call back phone number.   PLAN: RNCM will attempt 2nd outreach call within 3 business days.   Quinn Plowman RN,BSN,CCM [place] Coordinator [phone]

## 2015-07-25 NOTE — Patient Outreach (Signed)
[place] [facility]) Care Management  07/25/2015  Howard Payne 13-Mar-1920 [id number]   Notification from Quinn Plowman, RN to close case due to patient is now residing in an [place] of the [place] Management area.  Thanks, Ronnell Freshwater. [place], [place] Assistant Phone: [phone] Fax: [phone]

## 2015-08-18 IMAGING — CT CT HEAD W/O CM
4 of 7 series · 14 of 47 positions shown, 15 images · non-contrast
Comparison: 10/31/2012 CT

CLINICAL DATA: [AGE] male with headache and neck pain
following fall.

EXAM:
CT HEAD WITHOUT CONTRAST
CT CERVICAL SPINE WITHOUT CONTRAST
TECHNIQUE: Multidetector CT imaging of the head and cervical spine was
performed following the standard protocol without intravenous
contrast. Multiplanar CT image reconstructions of the cervical spine
were also generated.

[Series 2: head 5.0 h30s · axial · 0.46mm/px · z∈[-101,-11]mm · 3 of 36 slices shown, 4 images]
[im 9/36  brain]
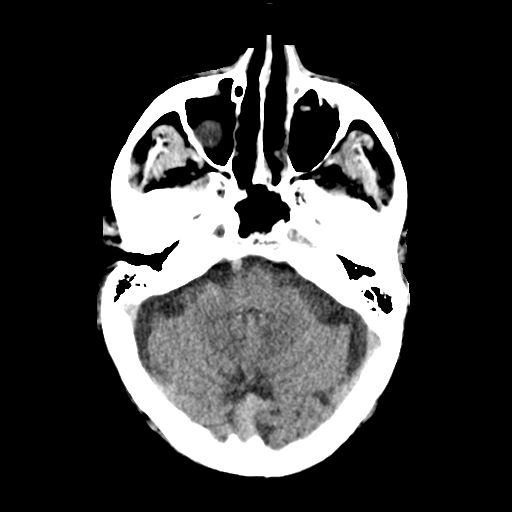
[im 9/36  bone]
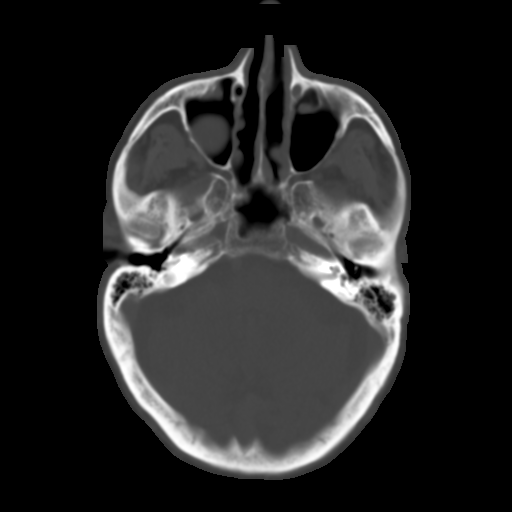
[im 18/36  brain]
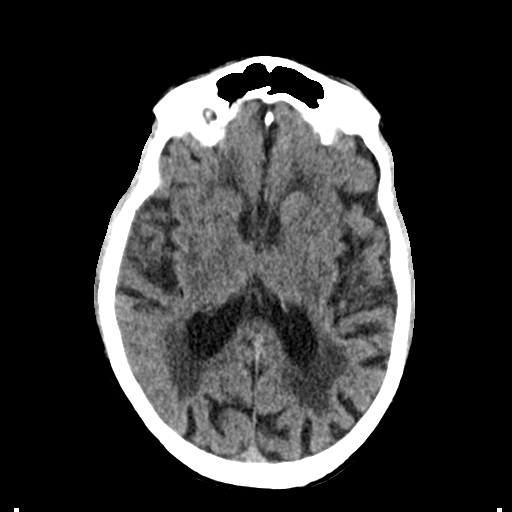
[im 27/36  brain]
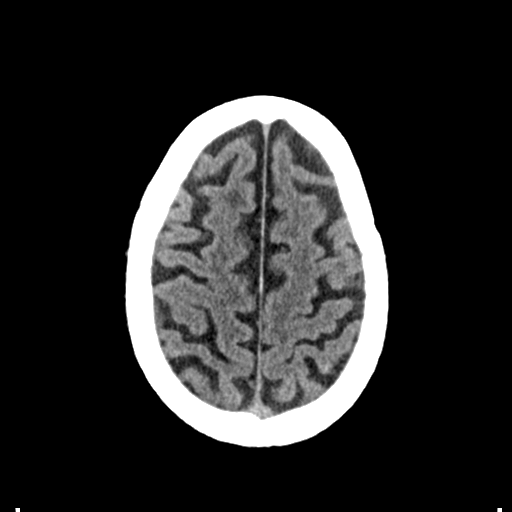

[Series 8: coronals · axial · 0.23mm/px · z∈[-177,-105]mm · 6 of 61 slices shown]
[im 9/61  brain]
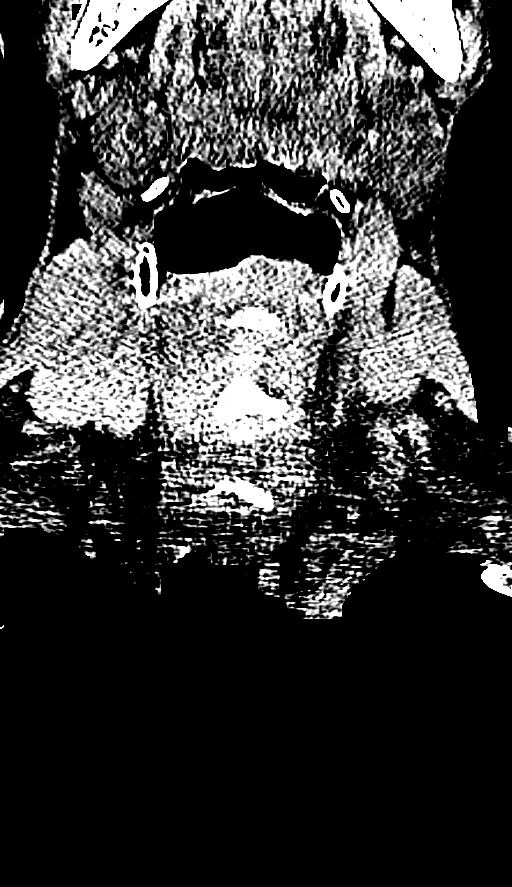
[im 18/61  brain]
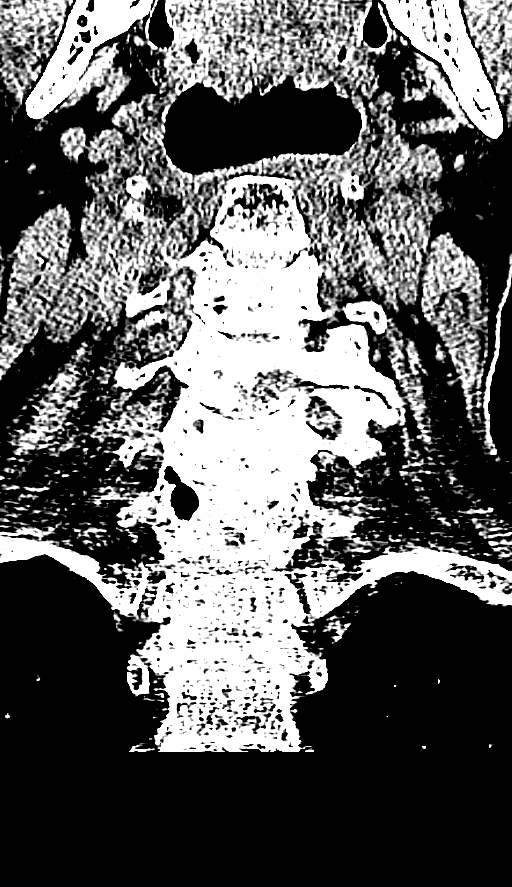
[im 26/61  brain]
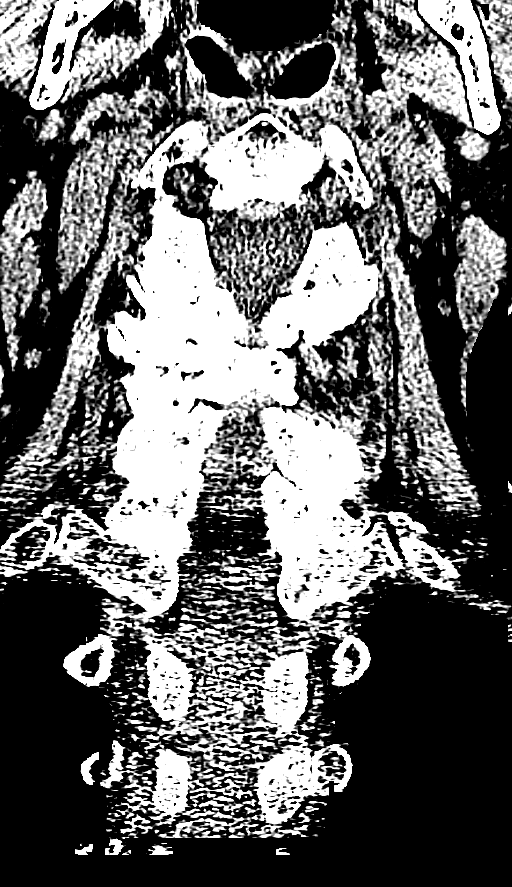
[im 35/61  brain]
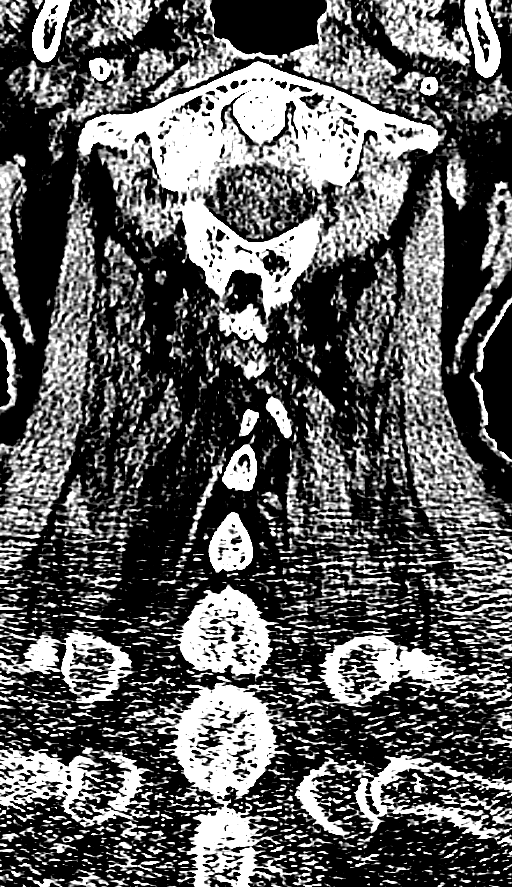
[im 43/61  brain]
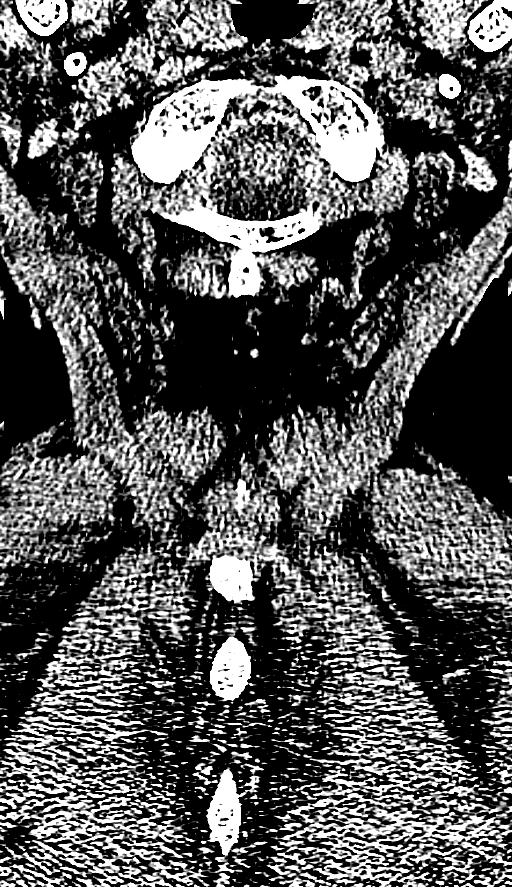
[im 52/61  brain]
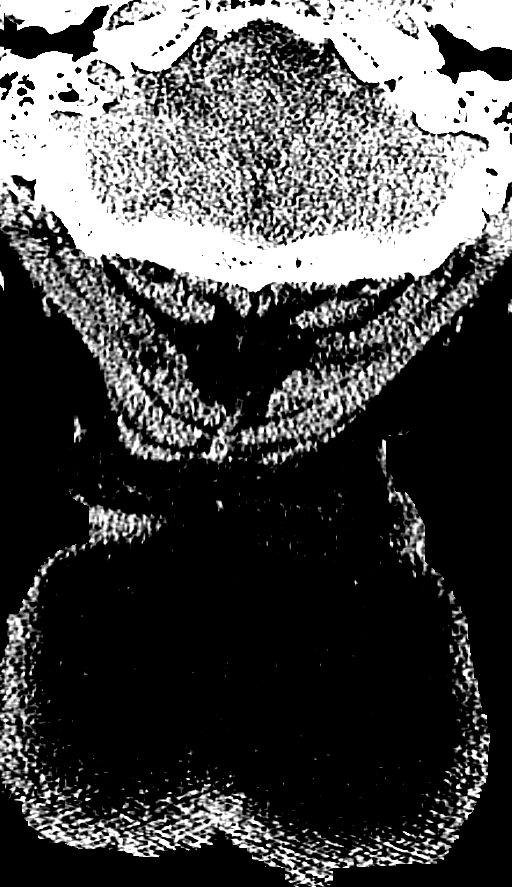

[Series 9: sagittals · sagittal · 0.31mm/px · 3 of 48 slices shown]
[im 16/48  brain]
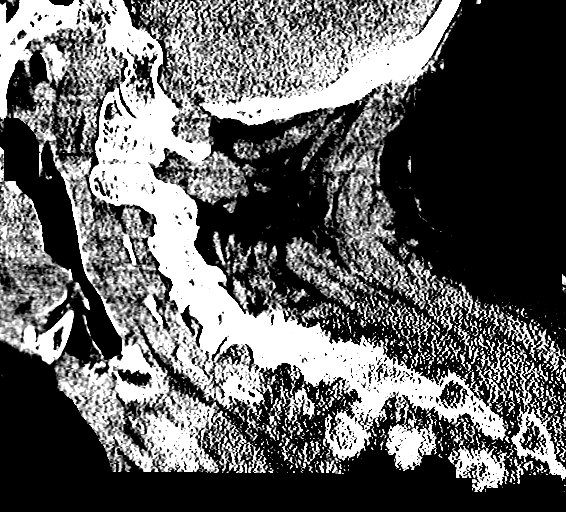
[im 24/48  brain]
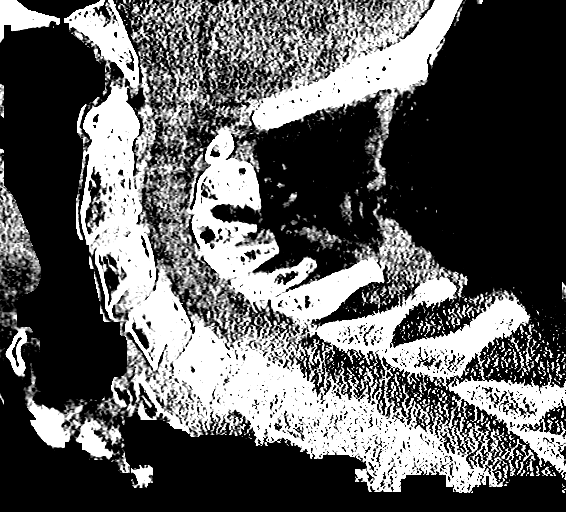
[im 32/48  brain]
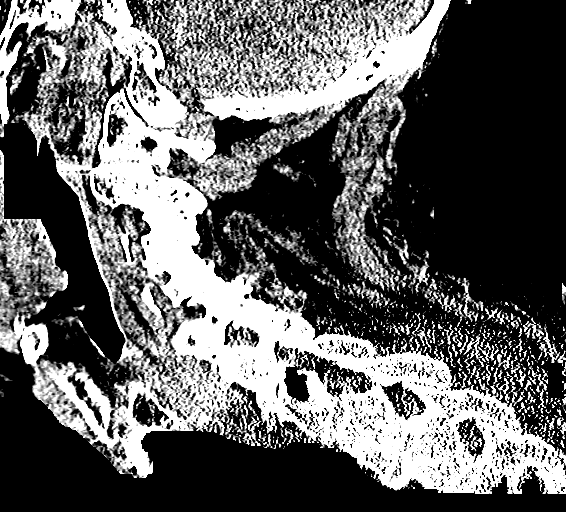

[Series 11: orthogonals 1 · coronal · 0.23mm/px · 2 of 89 slices shown]
[im 68/89  brain]
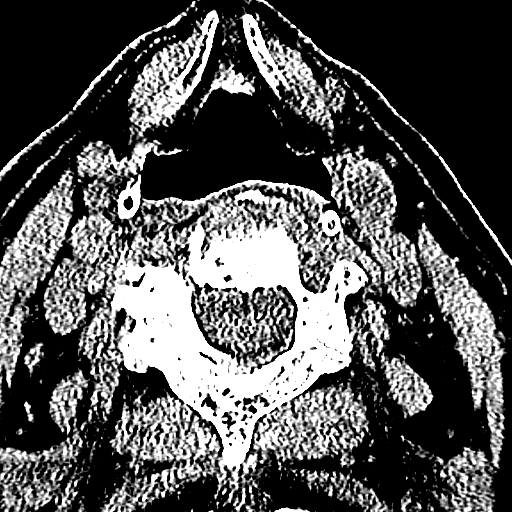
[im 78/89  brain]
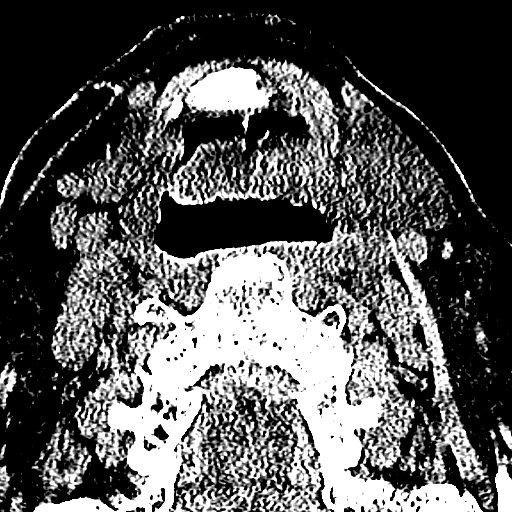

[14 of 47 positions shown; findings below may reference images not displayed]

FINDINGS: CT HEAD FINDINGS

Atrophy and chronic small-vessel white matter ischemic changes again
noted.

No acute intracranial abnormalities are identified, including mass
lesion or mass effect, hydrocephalus, extra-axial fluid collection,
midline shift, hemorrhage, or acute infarction. The visualized bony
calvarium is unremarkable.

CT CERVICAL SPINE FINDINGS

3 mm anterolisthesis of C5 on C6 and 1 mm anterolisthesis of C4 on
C5 again noted.

There is no evidence of acute fracture or new subluxation.

No prevertebral soft tissue swelling is present.

Degenerative disc disease and spondylosis from C5-T1 again
identified.

Moderate facet arthropathy throughout the cervical spine is
unchanged.

No focal bony lesions are present.
IMPRESSION: No evidence of acute abnormality.

Atrophy and chronic small-vessel white matter ischemic changes.

Multilevel degenerative changes within the cervical spine again
identified.

## 2015-08-18 IMAGING — CR DG CHEST 2V
2 series · 2 of 2 positions shown · non-contrast
Comparison: 03/04/2014

CLINICAL DATA: Cough

EXAM:
CHEST  2 VIEW

[w chest pa]
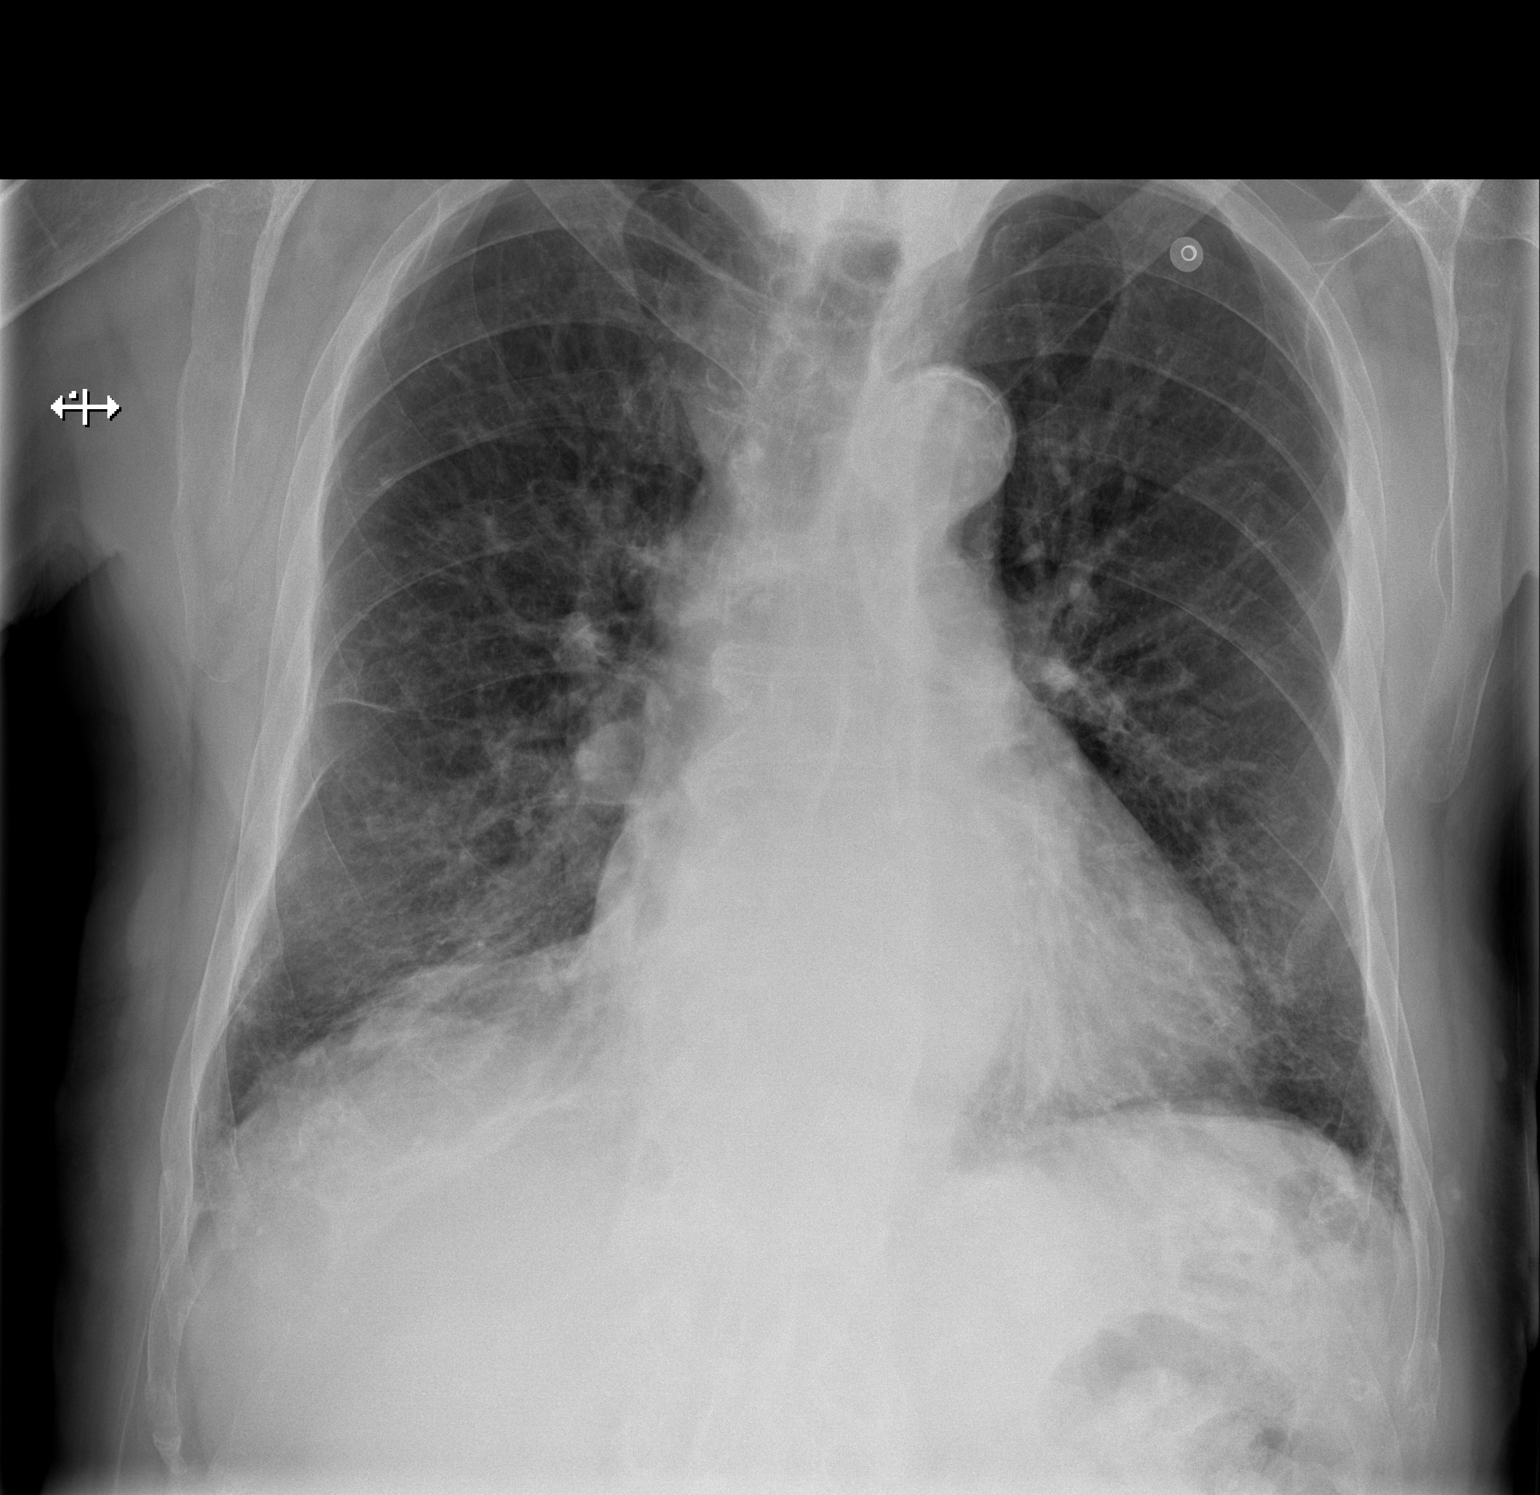

[w chest lat]
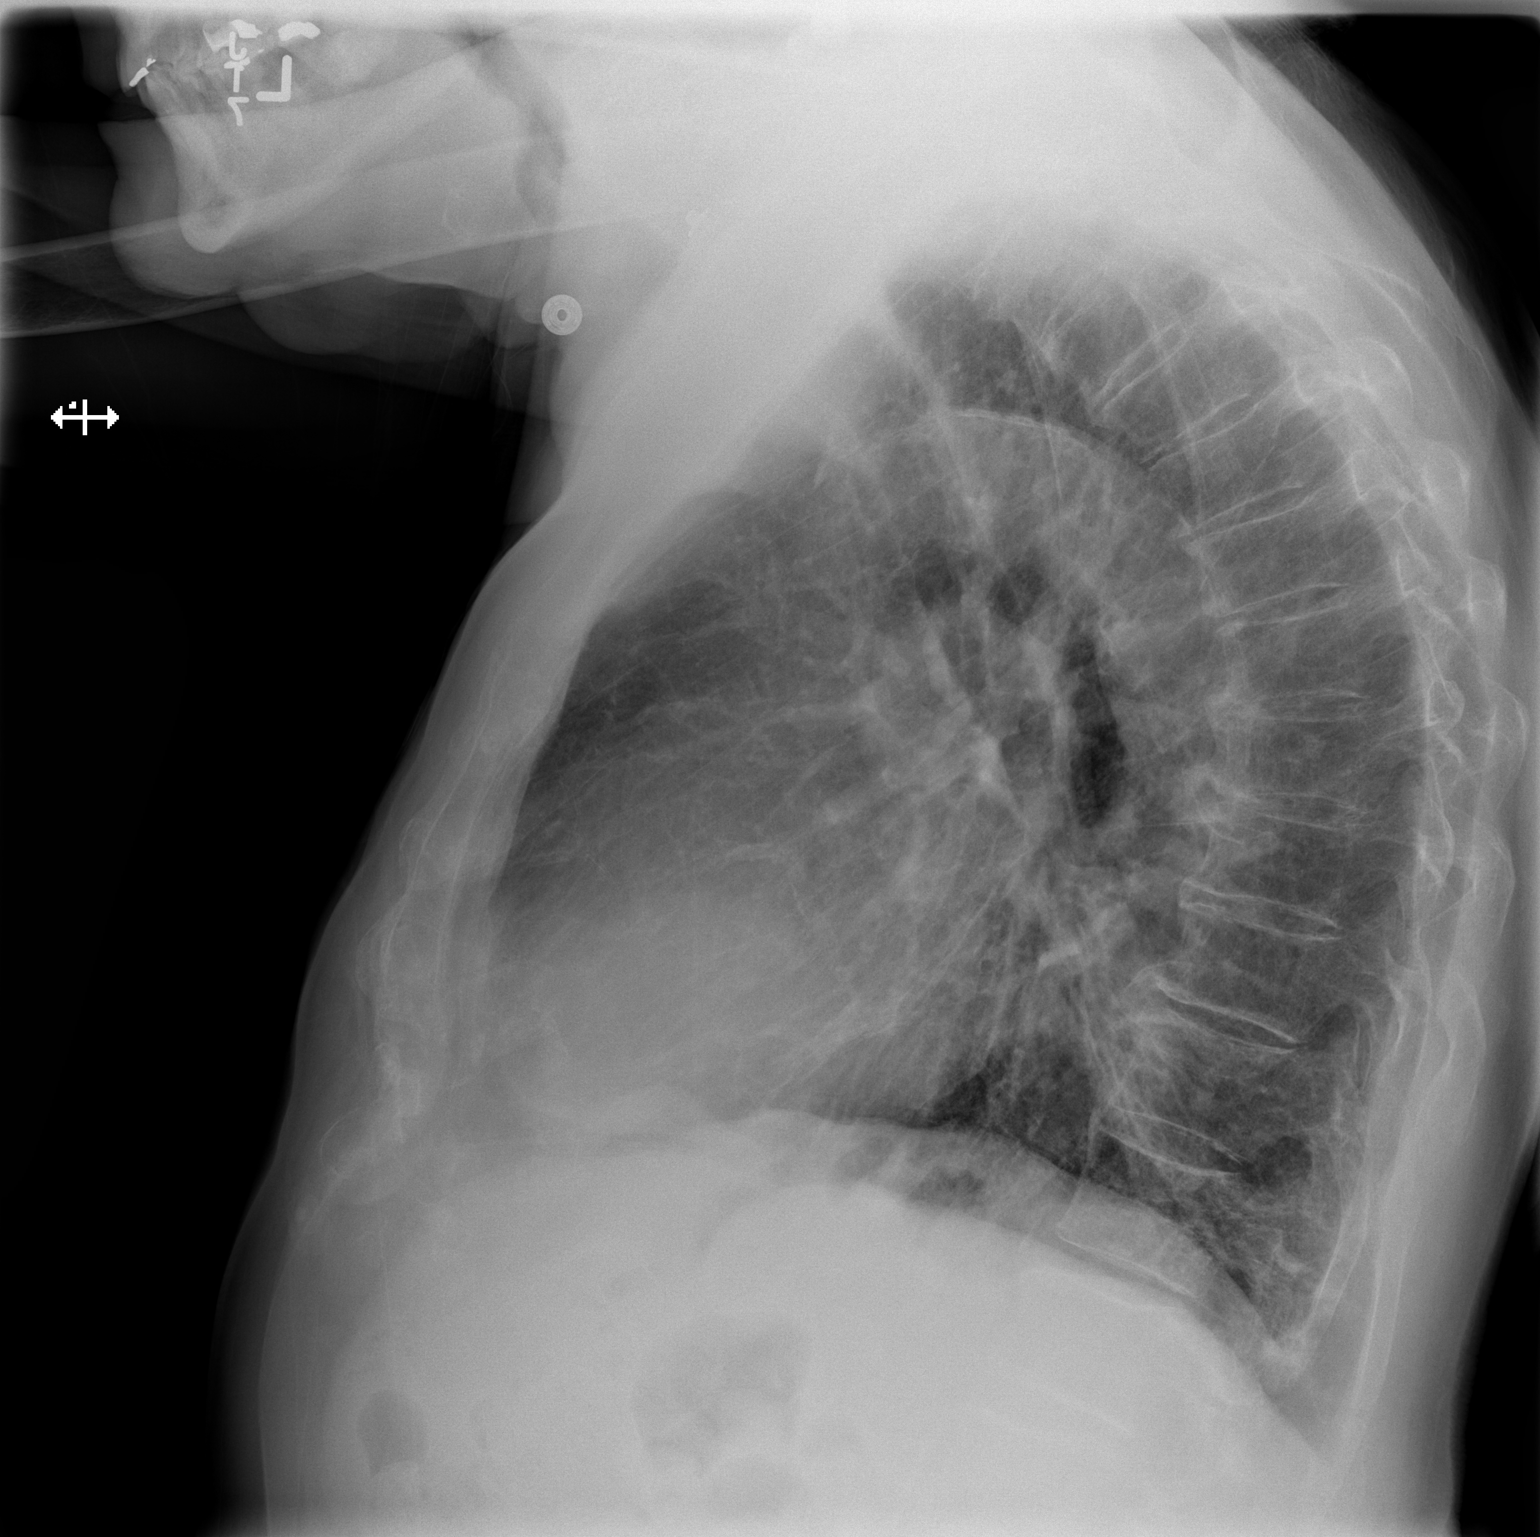

[2 of 2 positions shown; findings below may reference images not displayed]

FINDINGS: Cardiomegaly noted. Coarsened interstitial markings are identified
bilaterally without focal opacity. Minimal crowding at the lung
bases is identified. No pleural effusion. No new parenchymal
opacification. Mild kyphosis without compression deformity.
IMPRESSION: Chronically prominent interstitial markings without focal acute
finding. Minimal crowding at the lung bases.

## 2016-05-18 DEATH — deceased
